# Patient Record
Sex: Female | Born: 1935 | Race: White | Hispanic: No | Marital: Single | Smoking: Never smoker
Health system: Southern US, Community
[De-identification: ages and names within clinical notes are randomized; demographics above are authoritative.]

## PROBLEM LIST (undated history)

## (undated) DIAGNOSIS — Z972 Presence of dental prosthetic device (complete) (partial): Secondary | ICD-10-CM

## (undated) DIAGNOSIS — E7800 Pure hypercholesterolemia, unspecified: Secondary | ICD-10-CM

## (undated) DIAGNOSIS — E079 Disorder of thyroid, unspecified: Secondary | ICD-10-CM

## (undated) DIAGNOSIS — I10 Essential (primary) hypertension: Secondary | ICD-10-CM

## (undated) DIAGNOSIS — I4891 Unspecified atrial fibrillation: Secondary | ICD-10-CM

## (undated) DIAGNOSIS — E039 Hypothyroidism, unspecified: Secondary | ICD-10-CM

## (undated) DIAGNOSIS — E119 Type 2 diabetes mellitus without complications: Secondary | ICD-10-CM

## (undated) DIAGNOSIS — M1990 Unspecified osteoarthritis, unspecified site: Secondary | ICD-10-CM

## (undated) HISTORY — PX: ABDOMINAL HYSTERECTOMY: SHX81

## (undated) HISTORY — PX: SPLENECTOMY: SUR1306

## (undated) HISTORY — PX: TOTAL KNEE ARTHROPLASTY: SHX125

## (undated) HISTORY — PX: TONSILLECTOMY: SUR1361

## (undated) HISTORY — PX: APPENDECTOMY: SHX54

## (undated) HISTORY — PX: CHOLECYSTECTOMY: SHX55

---

## 2005-01-24 ENCOUNTER — Inpatient Hospital Stay: Payer: Self-pay | Admitting: Internal Medicine

## 2005-01-24 ENCOUNTER — Other Ambulatory Visit: Payer: Self-pay

## 2010-07-23 ENCOUNTER — Ambulatory Visit: Payer: Self-pay | Admitting: Internal Medicine

## 2012-04-23 ENCOUNTER — Ambulatory Visit: Payer: Self-pay | Admitting: Family Medicine

## 2013-08-24 DIAGNOSIS — E538 Deficiency of other specified B group vitamins: Secondary | ICD-10-CM | POA: Insufficient documentation

## 2014-10-18 DIAGNOSIS — E7800 Pure hypercholesterolemia, unspecified: Secondary | ICD-10-CM | POA: Insufficient documentation

## 2014-12-04 ENCOUNTER — Emergency Department: Payer: Medicare Other

## 2014-12-04 ENCOUNTER — Encounter: Payer: Self-pay | Admitting: Emergency Medicine

## 2014-12-04 ENCOUNTER — Emergency Department
Admission: EM | Admit: 2014-12-04 | Discharge: 2014-12-04 | Disposition: A | Discharge status: Home / Self Care | Payer: Medicare Other | Attending: Emergency Medicine | Admitting: Emergency Medicine

## 2014-12-04 DIAGNOSIS — Y9389 Activity, other specified: Secondary | ICD-10-CM | POA: Diagnosis not present

## 2014-12-04 DIAGNOSIS — S52571A Other intraarticular fracture of lower end of right radius, initial encounter for closed fracture: Secondary | ICD-10-CM | POA: Insufficient documentation

## 2014-12-04 DIAGNOSIS — Y998 Other external cause status: Secondary | ICD-10-CM | POA: Diagnosis not present

## 2014-12-04 DIAGNOSIS — Y9289 Other specified places as the place of occurrence of the external cause: Secondary | ICD-10-CM | POA: Diagnosis not present

## 2014-12-04 DIAGNOSIS — S52611A Displaced fracture of right ulna styloid process, initial encounter for closed fracture: Secondary | ICD-10-CM | POA: Diagnosis not present

## 2014-12-04 DIAGNOSIS — W1849XA Other slipping, tripping and stumbling without falling, initial encounter: Secondary | ICD-10-CM | POA: Insufficient documentation

## 2014-12-04 DIAGNOSIS — S52501A Unspecified fracture of the lower end of right radius, initial encounter for closed fracture: Secondary | ICD-10-CM

## 2014-12-04 DIAGNOSIS — E119 Type 2 diabetes mellitus without complications: Secondary | ICD-10-CM | POA: Diagnosis not present

## 2014-12-04 DIAGNOSIS — S6991XA Unspecified injury of right wrist, hand and finger(s), initial encounter: Secondary | ICD-10-CM | POA: Diagnosis present

## 2014-12-04 HISTORY — DX: Unspecified atrial fibrillation: I48.91

## 2014-12-04 HISTORY — DX: Pure hypercholesterolemia, unspecified: E78.00

## 2014-12-04 HISTORY — DX: Type 2 diabetes mellitus without complications: E11.9

## 2014-12-04 HISTORY — DX: Disorder of thyroid, unspecified: E07.9

## 2014-12-04 MED ORDER — OXYCODONE-ACETAMINOPHEN 5-325 MG PO TABS: ORAL_TABLET | ORAL | Status: DC

## 2014-12-04 MED ORDER — OXYCODONE-ACETAMINOPHEN 5-325 MG PO TABS
1.0000 | ORAL_TABLET | Freq: Once | ORAL | Status: AC
  Administered 2014-12-04: 1 via ORAL

## 2014-12-04 MED ORDER — OXYCODONE-ACETAMINOPHEN 5-325 MG PO TABS
ORAL_TABLET | ORAL | Status: AC
  Filled 2014-12-04: qty 1

## 2014-12-04 NOTE — ED Notes (Addendum)
Pt to ED via EMS from home. States she was trying to put a trash bag in the bin and tripped over her shoes, putting R arm out to break her fall. Contusion to lateral side of R wrist, pain to site 6/10, movement limited by swelling and pain. Able to move fingers. Denies loss of sensation. Cap refill <3 seconds. Pt takes coumadin.

## 2014-12-04 NOTE — ED Provider Notes (Signed)
[facility] Emergency Department Provider Note  ____________________________________________  Time seen: Approximately 6:07 PM  I have reviewed the triage vital signs and the nursing notes.   HISTORY  Chief Complaint Wrist Pain   HPI Yolanda Cantu is a 81 y.o. female is here via EMS with complaint of right wrist pain. Patient states that she was moving a neighbors trash bag to a BN when she tripped over her flip flops. She remembers putting her right arm out to break her fall and heard a "pop". Patient denies any head injury or loss of consciousness. She states the only thing that hurts is her wrist. She denies any previous wrist fracture. She has not taken any medication prior to her arrival. She states that in the past she has taken Percocet without any difficulty. Currently her pain is moderate however she does not want to take any pain medication at this time.   Past Medical History  Diagnosis Date  . Hypercholesteremia   . Atrial fibrillation (HCC)   . Thyroid disease   . Diabetes mellitus without complication (HCC)     There are no active problems to display for this patient.   Past Surgical History  Procedure Laterality Date  . Appendectomy    . Cholecystectomy    . Splenectomy      Current Outpatient Rx  Name  Route  Sig  Dispense  Refill  . oxyCODONE-acetaminophen (PERCOCET) 5-325 MG tablet      1 tablet q 4-6 hours prn pain   30 tablet   0     Allergies Review of patient's allergies indicates no known allergies.  No family history on file.  Social History Social History  Substance Use Topics  . Smoking status: Never Smoker   . Smokeless tobacco: Not on file  . Alcohol Use: No    Review of Systems Constitutional: No fever/chills Eyes: No visual changes. ENT: No facial trauma Cardiovascular: Denies chest pain. History of atrial fib under cardiologist care Respiratory: Denies shortness of breath. Gastrointestinal: No  abdominal pain.  No nausea, no vomiting.  Musculoskeletal: Negative for back pain. Positive for right wrist pain Skin: Negative for rash. Neurological: Negative for headaches, focal weakness or numbness.  10-point ROS otherwise negative.  ____________________________________________   PHYSICAL EXAM:  VITAL SIGNS: ED Triage Vitals  Enc Vitals Group     BP 12/04/14 1754 156/101 mmHg     Pulse Rate 12/04/14 1754 110     Resp 12/04/14 1754 20     Temp 12/04/14 1754 98.2 F (36.8 C)     Temp Source 12/04/14 1754 Oral     SpO2 12/04/14 1754 97 %     Weight 12/04/14 1754 170 lb (77.111 kg)     Height 12/04/14 1754 5\' 6"  (1.676 m)     Head Cir --      Peak Flow --      Pain Score --      Pain Loc --      Pain Edu? --      Excl. in GC? --     Constitutional: Alert and oriented. Well appearing and in no acute distress. Eyes: Conjunctivae are normal. PERRL. EOMI. Head: Atraumatic. Nose: No congestion/rhinnorhea. Neck: No stridor.  No cervical tenderness on palpation. Range of motion of the neck is within normal limits. Cardiovascular: Slightly tachycardic at arrival to the emergency room with irregular rate. Grossly normal heart sounds.  Good peripheral circulation. Respiratory: Normal respiratory effort.  No retractions. Lungs  CTAB. Gastrointestinal: Soft and nontender. No distention.  Musculoskeletal: Moderate tenderness on palpation of the right wrist. Range of motion of the digits is within normal limits. Capillary refill is less than 3 seconds. Motor sensory function intact. No lower extremity tenderness nor edema.   Neurologic:  Normal speech and language. No gross focal neurologic deficits are appreciated. No gait instability. Skin:  Skin is warm, dry and intact. No rash noted. Psychiatric: Mood and affect are normal. Speech and behavior are normal.  ____________________________________________   LABS (all labs ordered are listed, but only abnormal results are  displayed)  Labs Reviewed - No data to display   RADIOLOGY  X-ray the right wrist shows a nondisplaced comminuted distal radius fracture with mild apex volar angulation. Right ulnar styloid fracture.  I, Tommi Rumps, personally viewed and evaluated these images (plain radiographs) as part of my medical decision making.  ____________________________________________   PROCEDURES  Procedure(s) performed: None  Critical Care performed: No  ____________________________________________   INITIAL IMPRESSION / ASSESSMENT AND PLAN / ED COURSE  Pertinent labs & imaging results that were available during my care of the patient were reviewed by me and considered in my medical decision making (see chart for details).  Patient was placed in an OCL splint and sling. She was instructed to call [facility] orthopedic department to make an appointment. She is given a prescription for Percocet as needed for pain. She was aware that this could cause drowsiness which may increase her risk for falling. ____________________________________________   FINAL CLINICAL IMPRESSION(S) / ED DIAGNOSES  Final diagnoses:  Fracture of right distal radius, closed, initial encounter  Fracture of ulnar styloid, right, closed, initial encounter      Tommi Rumps, PA-C 12/04/14 1956  Jene Every, MD 12/04/14 2342

## 2014-12-04 NOTE — ED Notes (Signed)
Discussed discharge instructions, prescriptions, and follow-up care with patient. No questions or concerns at this time. Pt stable at discharge.  

## 2014-12-04 NOTE — Discharge Instructions (Signed)
Weight splint on until seen by orthopedist. Ice and elevate to reduce swelling. Percocet as needed for pain only as prescribed, one every 4-6 hours as needed for pain. The aware that this could cause drowsiness which may increase her risk of falling. Call Monday for an appointment to be seen for fracture. Return to the emergency room if any severe worsening of your symptoms.

## 2015-05-04 ENCOUNTER — Encounter: Payer: Self-pay | Admitting: *Deleted

## 2015-05-05 NOTE — Discharge Instructions (Signed)

## 2015-05-09 ENCOUNTER — Encounter
Admission: RE | Disposition: A | Discharge status: Home / Self Care | Payer: Self-pay | Source: Ambulatory Visit | Attending: Ophthalmology

## 2015-05-09 ENCOUNTER — Ambulatory Visit: Payer: Medicare Other | Admitting: Anesthesiology

## 2015-05-09 ENCOUNTER — Ambulatory Visit
Admission: RE | Admit: 2015-05-09 | Discharge: 2015-05-09 | Disposition: A | Discharge status: Home / Self Care | Payer: Medicare Other | Source: Ambulatory Visit | Attending: Ophthalmology | Admitting: Ophthalmology

## 2015-05-09 DIAGNOSIS — H2512 Age-related nuclear cataract, left eye: Secondary | ICD-10-CM | POA: Insufficient documentation

## 2015-05-09 DIAGNOSIS — E119 Type 2 diabetes mellitus without complications: Secondary | ICD-10-CM | POA: Diagnosis not present

## 2015-05-09 DIAGNOSIS — Z9889 Other specified postprocedural states: Secondary | ICD-10-CM | POA: Insufficient documentation

## 2015-05-09 DIAGNOSIS — Z79899 Other long term (current) drug therapy: Secondary | ICD-10-CM | POA: Insufficient documentation

## 2015-05-09 DIAGNOSIS — Z96652 Presence of left artificial knee joint: Secondary | ICD-10-CM | POA: Diagnosis not present

## 2015-05-09 DIAGNOSIS — Z90710 Acquired absence of both cervix and uterus: Secondary | ICD-10-CM | POA: Insufficient documentation

## 2015-05-09 DIAGNOSIS — H269 Unspecified cataract: Secondary | ICD-10-CM | POA: Diagnosis present

## 2015-05-09 DIAGNOSIS — E7800 Pure hypercholesterolemia, unspecified: Secondary | ICD-10-CM | POA: Insufficient documentation

## 2015-05-09 DIAGNOSIS — I10 Essential (primary) hypertension: Secondary | ICD-10-CM | POA: Diagnosis not present

## 2015-05-09 DIAGNOSIS — Z9049 Acquired absence of other specified parts of digestive tract: Secondary | ICD-10-CM | POA: Diagnosis not present

## 2015-05-09 DIAGNOSIS — E039 Hypothyroidism, unspecified: Secondary | ICD-10-CM | POA: Diagnosis not present

## 2015-05-09 DIAGNOSIS — M1990 Unspecified osteoarthritis, unspecified site: Secondary | ICD-10-CM | POA: Insufficient documentation

## 2015-05-09 DIAGNOSIS — Z7901 Long term (current) use of anticoagulants: Secondary | ICD-10-CM | POA: Diagnosis not present

## 2015-05-09 HISTORY — PX: CATARACT EXTRACTION W/PHACO: SHX586

## 2015-05-09 HISTORY — DX: Hypothyroidism, unspecified: E03.9

## 2015-05-09 HISTORY — DX: Presence of dental prosthetic device (complete) (partial): Z97.2

## 2015-05-09 HISTORY — DX: Unspecified osteoarthritis, unspecified site: M19.90

## 2015-05-09 HISTORY — DX: Essential (primary) hypertension: I10

## 2015-05-09 LAB — GLUCOSE, CAPILLARY
GLUCOSE-CAPILLARY: 86 mg/dL (ref 65–99)
Glucose-Capillary: 89 mg/dL (ref 65–99)

## 2015-05-09 SURGERY — PHACOEMULSIFICATION, CATARACT, WITH IOL INSERTION
Anesthesia: Monitor Anesthesia Care | Site: Eye | Laterality: Left | Wound class: Clean

## 2015-05-09 MED ORDER — BALANCED SALT IO SOLN
INTRAOCULAR | Status: DC | PRN
  Administered 2015-05-09: 1 mL via OPHTHALMIC

## 2015-05-09 MED ORDER — ACETAMINOPHEN 325 MG PO TABS: 325.0000 mg | ORAL_TABLET | ORAL | Status: DC | PRN

## 2015-05-09 MED ORDER — ARMC OPHTHALMIC DILATING GEL
1.0000 "application " | OPHTHALMIC | Status: DC | PRN
  Administered 2015-05-09 (×2): 1 via OPHTHALMIC

## 2015-05-09 MED ORDER — TETRACAINE HCL 0.5 % OP SOLN
1.0000 [drp] | OPHTHALMIC | Status: DC | PRN
  Administered 2015-05-09: 1 [drp] via OPHTHALMIC

## 2015-05-09 MED ORDER — BRIMONIDINE TARTRATE 0.2 % OP SOLN
OPHTHALMIC | Status: DC | PRN
  Administered 2015-05-09: 1 [drp] via OPHTHALMIC

## 2015-05-09 MED ORDER — LACTATED RINGERS IV SOLN: INTRAVENOUS | Status: DC

## 2015-05-09 MED ORDER — MIDAZOLAM HCL 2 MG/2ML IJ SOLN
INTRAMUSCULAR | Status: DC | PRN
  Administered 2015-05-09 (×2): 1 mg via INTRAVENOUS

## 2015-05-09 MED ORDER — FENTANYL CITRATE (PF) 100 MCG/2ML IJ SOLN
INTRAMUSCULAR | Status: DC | PRN
  Administered 2015-05-09 (×2): 50 ug via INTRAVENOUS

## 2015-05-09 MED ORDER — POVIDONE-IODINE 5 % OP SOLN
1.0000 "application " | OPHTHALMIC | Status: DC | PRN
  Administered 2015-05-09: 1 via OPHTHALMIC

## 2015-05-09 MED ORDER — TIMOLOL MALEATE 0.5 % OP SOLN: OPHTHALMIC | Status: DC | PRN

## 2015-05-09 MED ORDER — BSS IO SOLN
INTRAOCULAR | Status: DC | PRN
  Administered 2015-05-09: 197 mL via OPHTHALMIC

## 2015-05-09 MED ORDER — ACETAMINOPHEN 160 MG/5ML PO SOLN: 325.0000 mg | ORAL | Status: DC | PRN

## 2015-05-09 SURGICAL SUPPLY — 28 items

## 2015-05-09 NOTE — Op Note (Signed)
Date of Surgery: 05/09/2015  PREOPERATIVE DIAGNOSES: Visually significant nuclear sclerotic cataract, left eye.  POSTOPERATIVE DIAGNOSES: Same  PROCEDURES PERFORMED: Cataract extraction with intraocular lens implant, left eye.  SURGEON: Devin GoingAnita P. Vin, M.D.  ANESTHESIA: MAC and topical  IMPLANTS: AUT00 +22.5  Implant Name Type Inv. Item Serial No. Manufacturer Lot No. LRB No. Used  [id number] IOL PRE-LOADED     [id number] 153 [employer]   Left 1    COMPLICATIONS: None.  DESCRIPTION OF PROCEDURE: Therapeutic options were discussed with the patient preoperatively, including a discussion of risks and benefits of surgery. Informed consent was obtained. An IOL-Master and immersion biometry were used to take the lens measurements, and a dilated fundus exam was performed within 6 months of the surgical date.  The patient was premedicated and brought to the operating room and placed on the operating table in the supine position. After adequate anesthesia, the patient was prepped and draped in the usual sterile ophthalmic fashion. A wire lid speculum was inserted and the microscope was positioned. A Superblade was used to create a paracentesis site at the limbus and a small amount of dilute preservative free lidocaine was instilled into the anterior chamber, followed by dispersive viscoelastic. A clear corneal incision was created temporally using a 2.4 mm keratome blade. Capsulorrhexis was then performed. In situ phacoemulsification was performed.  Cortical material was removed with the irrigation-aspiration unit. Dispersive viscoelastic was instilled to open the capsular bag. A posterior chamber intraocular lens with the specifications above was inserted and positioned. Irrigation-aspiration was used to remove all viscoelastic. Cefuroxime 1cc was instilled into the anterior chamber, and the corneal incision was checked and found to be water tight. The eyelid speculum was removed.  The operative eye was  covered with protective goggles after instilling 1 drop of timolol and brimonidine. The patient tolerated the procedure well. There were no complications.

## 2015-05-09 NOTE — H&P (Signed)
H+P reviewed and is up to date, please see paper chart.  

## 2015-05-09 NOTE — Anesthesia Postprocedure Evaluation (Signed)
Anesthesia Post Note  Patient: Yolanda Cantu  Procedure(s) Performed: Procedure(s) (LRB): CATARACT EXTRACTION PHACO AND INTRAOCULAR LENS PLACEMENT (IOC) left eye (Left)  Patient location during evaluation: PACU Anesthesia Type: MAC Level of consciousness: awake and alert Pain management: pain level controlled Vital Signs Assessment: post-procedure vital signs reviewed and stable Respiratory status: spontaneous breathing and respiratory function stable Cardiovascular status: stable Anesthetic complications: no    Sharita Bienaime, III,  Freddy Spadafora D

## 2015-05-09 NOTE — Anesthesia Preprocedure Evaluation (Signed)
Anesthesia Evaluation  Patient identified by MRN, date of birth, ID band Patient awake    Reviewed: Allergy & Precautions, H&P , NPO status , Patient's Chart, lab work & pertinent test results  Airway Mallampati: II  TM Distance: >3 FB     Dental no notable dental hx.    Pulmonary neg pulmonary ROS,    Pulmonary exam normal        Cardiovascular hypertension, On Medications Normal cardiovascular exam     Neuro/Psych negative neurological ROS  negative psych ROS   GI/Hepatic negative GI ROS, Neg liver ROS,   Endo/Other  diabetes, Type 2Hypothyroidism   Renal/GU      Musculoskeletal   Abdominal   Peds  Hematology negative hematology ROS (+)   Anesthesia Other Findings   Reproductive/Obstetrics                             Anesthesia Physical Anesthesia Plan  ASA: II  Anesthesia Plan: MAC   Post-op Pain Management:    Induction:   Airway Management Planned:   Additional Equipment:   Intra-op Plan:   Post-operative Plan:   Informed Consent: I have reviewed the patients History and Physical, chart, labs and discussed the procedure including the risks, benefits and alternatives for the proposed anesthesia with the patient or authorized representative who has indicated his/her understanding and acceptance.     Plan Discussed with: CRNA  Anesthesia Plan Comments:         Anesthesia Quick Evaluation

## 2015-05-09 NOTE — Anesthesia Procedure Notes (Signed)
Procedure Name: MAC Performed by: Zorawar Strollo Pre-anesthesia Checklist: Patient identified, Emergency Drugs available, Suction available, Timeout performed and Patient being monitored Patient Re-evaluated:Patient Re-evaluated prior to inductionOxygen Delivery Method: Nasal cannula Placement Confirmation: positive ETCO2     

## 2015-05-09 NOTE — Transfer of Care (Signed)
Immediate Anesthesia Transfer of Care Note  Patient: Yolanda SportsMary R Gelder  Procedure(s) Performed: Procedure(s) with comments: CATARACT EXTRACTION PHACO AND INTRAOCULAR LENS PLACEMENT (IOC) left eye (Left) - DIABETIC - oral meds per pt 8 or after for arrival time  Patient Location: PACU  Anesthesia Type: MAC  Level of Consciousness: awake, alert  and patient cooperative  Airway and Oxygen Therapy: Patient Spontanous Breathing and Patient connected to supplemental oxygen  Post-op Assessment: Post-op Vital signs reviewed, Patient's Cardiovascular Status Stable, Respiratory Function Stable, Patent Airway and No signs of Nausea or vomiting  Post-op Vital Signs: Reviewed and stable  Complications: No apparent anesthesia complications

## 2015-05-10 ENCOUNTER — Encounter: Payer: Self-pay | Admitting: Ophthalmology

## 2015-06-25 ENCOUNTER — Encounter: Payer: Self-pay | Admitting: Emergency Medicine

## 2015-06-25 ENCOUNTER — Ambulatory Visit
Admission: EM | Admit: 2015-06-25 | Discharge: 2015-06-25 | Disposition: A | Discharge status: Home / Self Care | Payer: Medicare Other | Attending: Family Medicine | Admitting: Family Medicine

## 2015-06-25 DIAGNOSIS — R3911 Hesitancy of micturition: Secondary | ICD-10-CM

## 2015-06-25 DIAGNOSIS — R350 Frequency of micturition: Secondary | ICD-10-CM

## 2015-06-25 MED ORDER — AMOXICILLIN 500 MG PO CAPS: 500.0000 mg | ORAL_CAPSULE | Freq: Two times a day (BID) | ORAL | Status: DC

## 2015-06-25 NOTE — ED Notes (Signed)
Patient c/o bladder pressure, urinary frequency that started today.

## 2015-06-25 NOTE — Discharge Instructions (Signed)
Urinary Frequency °The number of times a normal person urinates depends upon how much liquid they take in and how much liquid they are losing. If the temperature is hot and there is high humidity, then the person will sweat more and usually breathe a little more frequently. These factors decrease the amount of frequency of urination that would be considered normal. °The amount you drink is easily determined, but the amount of fluid lost is sometimes more difficult to calculate.  °Fluid is lost in two ways: °· Sensible fluid loss is usually measured by the amount of urine that you get rid of. Losses of fluid can also occur with diarrhea. °· Insensible fluid loss is more difficult to measure. It is caused by evaporation. Insensible loss of fluid occurs through breathing and sweating. It usually ranges from a little less than a quart to a little more than a quart of fluid a day. °In normal temperatures and activity levels, the average person may urinate 4 to 7 times in a 24-hour period. Needing to urinate more often than that could indicate a problem. If one urinates 4 to 7 times in 24 hours and has large volumes each time, that could indicate a different problem from one who urinates 4 to 7 times a day and has small volumes. The time of urinating is also important. Most urinating should be done during the waking hours. Getting up at night to urinate frequently can indicate some problems. °CAUSES  °The bladder is the organ in your lower abdomen that holds urine. Like a balloon, it swells some as it fills up. Your nerves sense this and tell you it is time to head for the bathroom. There are a number of reasons that you might feel the need to urinate more often than usual. They include: °· Urinary tract infection. This is usually associated with other signs such as burning when you urinate. °· In men, problems with the prostate (a walnut-size gland that is located near the tube that carries urine out of your body). There  are two reasons why the prostate can cause an increased frequency of urination: °¨ An enlarged prostate that does not let the bladder empty well. If the bladder only half empties when you urinate, then it only has half the capacity to fill before you have to urinate again. °¨ The nerves in the bladder become more hypersensitive with an increased size of the prostate even if the bladder empties completely. °· Pregnancy. °· Obesity. Excess weight is more likely to cause a problem for women than for men. °· Bladder stones or other bladder problems. °· Caffeine. °· Alcohol. °· Medications. For example, drugs that help the body get rid of extra fluid (diuretics) increase urine production. Some other medicines must be taken with lots of fluids. °· Muscle or nerve weakness. This might be the result of a spinal cord injury, a stroke, multiple sclerosis, or Parkinson disease. °· Long-standing diabetes can decrease the sensation of the bladder. This loss of sensation makes it harder to sense the bladder needs to be emptied. Over a period of years, the bladder is stretched out by constant overfilling. This weakens the bladder muscles so that the bladder does not empty well and has less capacity to fill with new urine. °· Interstitial cystitis (also called painful bladder syndrome). This condition develops because the tissues that line the inside of the bladder are inflamed (inflammation is the body's way of reacting to injury or infection). It causes pain and frequent   urination. It occurs in women more often than in men. °DIAGNOSIS  °· To decide what might be causing your urinary frequency, your health care provider will probably: °¨ Ask about symptoms you have noticed. °¨ Ask about your overall health. This will include questions about any medications you are taking. °¨ Do a physical examination. °· Order some tests. These might include: °¨ A blood test to check for diabetes or other health issues that could be contributing  to the problem. °¨ Urine testing. This could measure the flow of urine and the pressure on the bladder. °¨ A test of your neurological system (the brain, spinal cord, and nerves). This is the system that senses the need to urinate. °¨ A bladder test to check whether it is emptying completely when you urinate. °¨ Cystoscopy. This test uses a thin tube with a tiny camera on it. It offers a look inside your urethra and bladder to see if there are problems. °¨ Imaging tests. You might be given a contrast dye and then asked to urinate. X-rays are taken to see how your bladder is working. °TREATMENT  °It is important for you to be evaluated to determine if the amount or frequency that you have is unusual or abnormal. If it is found to be abnormal, the cause should be determined and this can usually be found out easily. Depending upon the cause, treatment could include medication, stimulation of the nerves, or surgery. °There are not too many things that you can do as an individual to change your urinary frequency. It is important that you balance the amount of fluid intake needed to compensate for your activity and the temperature. Medical problems will be diagnosed and taken care of by your physician. There is no particular bladder training such as Kegel exercises that you can do to help urinary frequency. This is an exercise that is usually recommended for people who have leaking of urine when they laugh, cough, or sneeze. °HOME CARE INSTRUCTIONS  °· Take any medications your health care provider prescribed or suggested. Follow the directions carefully. °· Practice any lifestyle changes that are recommended. These might include: °¨ Drinking less fluid or drinking at different times of the day. If you need to urinate often during the night, for example, you may need to stop drinking fluids early in the evening. °¨ Cutting down on caffeine or alcohol. They both can make you need to urinate more often than normal. Caffeine  is found in coffee, tea, and sodas. °¨ Losing weight, if that is recommended. °· Keep a journal or a log. You might be asked to record how much you drink and when and where you feel the need to urinate. This will also help evaluate how well the treatment provided by your physician is working. °SEEK MEDICAL CARE IF:  °· Your need to urinate often gets worse. °· You feel increased pain or irritation when you urinate. °· You notice blood in your urine. °· You have questions about any medications that your health care provider recommended. °· You notice blood, pus, or swelling at the site of any test or treatment procedure. °· You develop a fever of more than 100.5°F (38.1°C). °SEEK IMMEDIATE MEDICAL CARE IF:  °You develop a fever of more than 102.0°F (38.9°C). °  °This information is not intended to replace advice given to you by your health care provider. Make sure you discuss any questions you have with your health care provider. °  °Document Released: 11/11/2008 Document Revised:   02/05/2014 Document Reviewed: 11/11/2008 °Elsevier Interactive Patient Education ©2016 [employer] ° °

## 2015-06-25 NOTE — ED Provider Notes (Signed)
CSN: [id number]     Arrival date & time 06/25/15  1529 History   First MD Initiated Contact with Patient 06/25/15 1620     Chief Complaint  Patient presents with  . Dysuria   (Consider location/radiation/quality/duration/timing/severity/associated sxs/prior Treatment) Patient is a 82 y.o. female presenting with frequency. The history is provided by the patient. No language interpreter was used.  Urinary Frequency This is a new problem. The current episode started 6 to 12 hours ago. The problem occurs constantly. The problem has not changed since onset.Pertinent negatives include no chest pain, no abdominal pain, no headaches and no shortness of breath. Associated symptoms comments: Urinary hesitancy; pressure over the bladder; denies fevers, chills, abdominal pain, flank pain, vomiting. She has tried nothing for the symptoms.    Past Medical History  Diagnosis Date  . Hypercholesteremia   . Atrial fibrillation (HCC)   . Thyroid disease   . Diabetes mellitus without complication (HCC)   . Arthritis     knees  . Wears dentures     partial lower  . Hypertension   . Hypothyroidism    Past Surgical History  Procedure Laterality Date  . Appendectomy    . Cholecystectomy    . Splenectomy    . Abdominal hysterectomy    . Splenectomy    . Tonsillectomy    . Total knee arthroplasty Left   . Cataract extraction w/phaco Left 05/09/2015    Procedure: CATARACT EXTRACTION PHACO AND INTRAOCULAR LENS PLACEMENT (IOC) left eye;  Surgeon: Sherald Hess, MD;  Location[facility];  Service: Ophthalmology;  Laterality: Left;  DIABETIC - oral meds per pt 8 or after for arrival time   History reviewed. No pertinent family history. Social History  Substance Use Topics  . Smoking status: Never Smoker   . Smokeless tobacco: None  . Alcohol Use: No   OB History    No data available     Review of Systems  Respiratory: Negative for shortness of breath.   Cardiovascular:  Negative for chest pain.  Gastrointestinal: Negative for abdominal pain.  Genitourinary: Positive for frequency.  Neurological: Negative for headaches.    Allergies  Review of patient's allergies indicates no known allergies.  Home Medications   Prior to Admission medications   Medication Sig Start Date End Date Taking? Authorizing Provider  acetaminophen (TYLENOL) 500 MG tablet Take 500 mg by mouth every 6 (six) hours as needed.    Historical Provider, MD  amoxicillin (AMOXIL) 500 MG capsule Take 1 capsule (500 mg total) by mouth 2 (two) times daily. 06/25/15   Payton Mccallum, MD  atorvastatin (LIPITOR) 40 MG tablet Take 20 mg by mouth daily.    Historical Provider, MD  Cyanocobalamin (VITAMIN B-12 IJ) Inject as directed every 30 (thirty) days.    Historical Provider, MD  diltiazem (TIAZAC) 240 MG 24 hr capsule Take 240 mg by mouth daily.    Historical Provider, MD  diphenhydramine-acetaminophen (TYLENOL PM) 25-500 MG TABS tablet Take 2 tablets by mouth at bedtime as needed.    Historical Provider, MD  latanoprost (XALATAN) 0.005 % ophthalmic solution Place 1 drop into both eyes at bedtime.    Historical Provider, MD  levothyroxine (SYNTHROID, LEVOTHROID) 125 MCG tablet Take 125 mcg by mouth daily before breakfast.    Historical Provider, MD  losartan (COZAAR) 50 MG tablet Take 100 mg by mouth daily.     Historical Provider, MD  metFORMIN (GLUCOPHAGE) 500 MG tablet Take 250 mg by mouth 2 (two) times  daily with a meal.    Historical Provider, MD  timolol (BETIMOL) 0.5 % ophthalmic solution Place 1 drop into both eyes daily.    Historical Provider, MD  warfarin (COUMADIN) 2 MG tablet Take 2 mg by mouth daily.    Historical Provider, MD   Meds Ordered and Administered this Visit  Medications - No data to display  BP 140/80 mmHg  Pulse 103  Temp(Src) 97.3 F (36.3 C) (Tympanic)  Resp 17  Ht 5\' 6"  (1.676 m)  Wt 170 lb (77.111 kg)  BMI 27.45 kg/m2  SpO2 97% No data found.   Physical  Exam  Constitutional: She appears well-developed and well-nourished. No distress.  Abdominal: Soft. Bowel sounds are normal. She exhibits no distension and no mass. There is no tenderness. There is no rebound and no guarding.  Skin: She is not diaphoretic.  Nursing note and vitals reviewed.   ED Course  Procedures (including critical care time)  Labs Review Labs Reviewed - No data to display  Imaging Review No results found.   Visual Acuity Review  Right Eye Distance:   Left Eye Distance:   Bilateral Distance:    Right Eye Near:   Left Eye Near:    Bilateral Near:         MDM   1. Urinary frequency   2. Urinary hesitancy    Discharge Medication List as of 06/25/2015  4:48 PM    START taking these medications   Details  amoxicillin (AMOXIL) 500 MG capsule Take 1 capsule (500 mg total) by mouth 2 (two) times daily., Starting 06/25/2015, Until Discontinued, Normal       1. Possible etiology reviewed with patient; (patient unable to urinate for sample in clinic) 2. rx as per orders above; reviewed possible side effects, interactions, risks and benefits  3. Recommend supportive treatment with increased fluids/water 4. Follow-up prn if symptoms worsen or don't improve    Payton Mccallumrlando Jaqueline Uber, MD 06/25/15 1726

## 2015-09-23 ENCOUNTER — Emergency Department: Payer: Medicare Other

## 2015-09-23 ENCOUNTER — Encounter: Payer: Self-pay | Admitting: Emergency Medicine

## 2015-09-23 ENCOUNTER — Inpatient Hospital Stay
Admission: EM | Admit: 2015-09-23 | Discharge: 2015-09-28 | DRG: 481 | Disposition: A | Discharge status: Skilled Nursing Facility | Payer: Medicare Other | Attending: Internal Medicine | Admitting: Internal Medicine

## 2015-09-23 DIAGNOSIS — M25461 Effusion, right knee: Secondary | ICD-10-CM | POA: Diagnosis present

## 2015-09-23 DIAGNOSIS — Z7901 Long term (current) use of anticoagulants: Secondary | ICD-10-CM | POA: Diagnosis not present

## 2015-09-23 DIAGNOSIS — Z823 Family history of stroke: Secondary | ICD-10-CM | POA: Diagnosis not present

## 2015-09-23 DIAGNOSIS — Z801 Family history of malignant neoplasm of trachea, bronchus and lung: Secondary | ICD-10-CM | POA: Diagnosis not present

## 2015-09-23 DIAGNOSIS — Z833 Family history of diabetes mellitus: Secondary | ICD-10-CM | POA: Diagnosis not present

## 2015-09-23 DIAGNOSIS — Z8249 Family history of ischemic heart disease and other diseases of the circulatory system: Secondary | ICD-10-CM | POA: Diagnosis not present

## 2015-09-23 DIAGNOSIS — W1830XA Fall on same level, unspecified, initial encounter: Secondary | ICD-10-CM | POA: Diagnosis present

## 2015-09-23 DIAGNOSIS — I10 Essential (primary) hypertension: Secondary | ICD-10-CM | POA: Diagnosis present

## 2015-09-23 DIAGNOSIS — E876 Hypokalemia: Secondary | ICD-10-CM | POA: Diagnosis not present

## 2015-09-23 DIAGNOSIS — E871 Hypo-osmolality and hyponatremia: Secondary | ICD-10-CM | POA: Diagnosis present

## 2015-09-23 DIAGNOSIS — M25569 Pain in unspecified knee: Secondary | ICD-10-CM

## 2015-09-23 DIAGNOSIS — Z96652 Presence of left artificial knee joint: Secondary | ICD-10-CM | POA: Diagnosis present

## 2015-09-23 DIAGNOSIS — E039 Hypothyroidism, unspecified: Secondary | ICD-10-CM | POA: Diagnosis present

## 2015-09-23 DIAGNOSIS — D62 Acute posthemorrhagic anemia: Secondary | ICD-10-CM | POA: Diagnosis not present

## 2015-09-23 DIAGNOSIS — I4891 Unspecified atrial fibrillation: Secondary | ICD-10-CM | POA: Diagnosis present

## 2015-09-23 DIAGNOSIS — Y92000 Kitchen of unspecified non-institutional (private) residence as  the place of occurrence of the external cause: Secondary | ICD-10-CM

## 2015-09-23 DIAGNOSIS — E119 Type 2 diabetes mellitus without complications: Secondary | ICD-10-CM | POA: Diagnosis present

## 2015-09-23 DIAGNOSIS — E7800 Pure hypercholesterolemia, unspecified: Secondary | ICD-10-CM | POA: Diagnosis present

## 2015-09-23 DIAGNOSIS — Z7984 Long term (current) use of oral hypoglycemic drugs: Secondary | ICD-10-CM | POA: Diagnosis not present

## 2015-09-23 DIAGNOSIS — Z79899 Other long term (current) drug therapy: Secondary | ICD-10-CM

## 2015-09-23 DIAGNOSIS — D72829 Elevated white blood cell count, unspecified: Secondary | ICD-10-CM | POA: Diagnosis present

## 2015-09-23 DIAGNOSIS — Z9081 Acquired absence of spleen: Secondary | ICD-10-CM | POA: Diagnosis not present

## 2015-09-23 DIAGNOSIS — S72141A Displaced intertrochanteric fracture of right femur, initial encounter for closed fracture: Secondary | ICD-10-CM | POA: Diagnosis present

## 2015-09-23 DIAGNOSIS — S72001A Fracture of unspecified part of neck of right femur, initial encounter for closed fracture: Secondary | ICD-10-CM | POA: Diagnosis present

## 2015-09-23 DIAGNOSIS — S72009A Fracture of unspecified part of neck of unspecified femur, initial encounter for closed fracture: Secondary | ICD-10-CM

## 2015-09-23 LAB — URINALYSIS COMPLETE WITH MICROSCOPIC (ARMC ONLY)
BILIRUBIN URINE: NEGATIVE
Bacteria, UA: NONE SEEN
Glucose, UA: NEGATIVE mg/dL
KETONES UR: NEGATIVE mg/dL
LEUKOCYTES UA: NEGATIVE
NITRITE: NEGATIVE
PH: 6 (ref 5.0–8.0)
PROTEIN: 30 mg/dL — AB
SPECIFIC GRAVITY, URINE: 1.008 (ref 1.005–1.030)
Squamous Epithelial / LPF: NONE SEEN

## 2015-09-23 LAB — CBC
HCT: 33.8 % — ABNORMAL LOW (ref 35.0–47.0)
Hemoglobin: 11.5 g/dL — ABNORMAL LOW (ref 12.0–16.0)
MCH: 30.9 pg (ref 26.0–34.0)
MCHC: 34.1 g/dL (ref 32.0–36.0)
MCV: 90.6 fL (ref 80.0–100.0)
PLATELETS: 294 10*3/uL (ref 150–440)
RBC: 3.73 MIL/uL — AB (ref 3.80–5.20)
RDW: 15.1 % — AB (ref 11.5–14.5)
WBC: 10.7 10*3/uL (ref 3.6–11.0)

## 2015-09-23 LAB — PROTIME-INR
INR: 2.57
Prothrombin Time: 28.1 seconds — ABNORMAL HIGH (ref 11.4–15.2)

## 2015-09-23 LAB — BASIC METABOLIC PANEL
Anion gap: 7 (ref 5–15)
BUN: 12 mg/dL (ref 6–20)
CALCIUM: 8.7 mg/dL — AB (ref 8.9–10.3)
CO2: 23 mmol/L (ref 22–32)
Chloride: 100 mmol/L — ABNORMAL LOW (ref 101–111)
Creatinine, Ser: 0.55 mg/dL (ref 0.44–1.00)
GLUCOSE: 122 mg/dL — AB (ref 65–99)
POTASSIUM: 3.5 mmol/L (ref 3.5–5.1)
Sodium: 130 mmol/L — ABNORMAL LOW (ref 135–145)

## 2015-09-23 MED ORDER — ONDANSETRON HCL 4 MG/2ML IJ SOLN
4.0000 mg | Freq: Once | INTRAMUSCULAR | Status: AC
  Administered 2015-09-23: 4 mg via INTRAVENOUS
  Filled 2015-09-23: qty 2

## 2015-09-23 MED ORDER — CEFAZOLIN SODIUM-DEXTROSE 2-4 GM/100ML-% IV SOLN
2.0000 g | Freq: Once | INTRAVENOUS | Status: AC
  Administered 2015-09-25: 2 g via INTRAVENOUS
  Filled 2015-09-23 (×2): qty 100

## 2015-09-23 MED ORDER — MORPHINE SULFATE (PF) 4 MG/ML IV SOLN
4.0000 mg | Freq: Once | INTRAVENOUS | Status: AC
  Administered 2015-09-23: 4 mg via INTRAVENOUS

## 2015-09-23 MED ORDER — SODIUM CHLORIDE 0.9 % IV SOLN
INTRAVENOUS | Status: DC
  Administered 2015-09-23: via INTRAVENOUS

## 2015-09-23 MED ORDER — METHOCARBAMOL 500 MG PO TABS
500.0000 mg | ORAL_TABLET | Freq: Four times a day (QID) | ORAL | Status: DC | PRN
  Administered 2015-09-24 (×2): 500 mg via ORAL
  Filled 2015-09-23 (×2): qty 1

## 2015-09-23 MED ORDER — LOSARTAN POTASSIUM 50 MG PO TABS
100.0000 mg | ORAL_TABLET | Freq: Every day | ORAL | Status: DC
  Filled 2015-09-23 (×2): qty 2

## 2015-09-23 MED ORDER — DEXTROSE 5 % IV SOLN: 500.0000 mg | Freq: Four times a day (QID) | INTRAVENOUS | Status: DC | PRN

## 2015-09-23 MED ORDER — VITAMIN K1 10 MG/ML IJ SOLN
10.0000 mg | Freq: Once | INTRAVENOUS | Status: AC
  Administered 2015-09-24: 10 mg via INTRAVENOUS
  Filled 2015-09-23: qty 1

## 2015-09-23 MED ORDER — ONDANSETRON HCL 4 MG PO TABS: 4.0000 mg | ORAL_TABLET | Freq: Four times a day (QID) | ORAL | Status: DC | PRN

## 2015-09-23 MED ORDER — SODIUM CHLORIDE 0.9 % IV BOLUS (SEPSIS)
1000.0000 mL | Freq: Once | INTRAVENOUS | Status: AC
  Administered 2015-09-23: 1000 mL via INTRAVENOUS

## 2015-09-23 MED ORDER — MORPHINE SULFATE (PF) 4 MG/ML IV SOLN: 4.0000 mg | INTRAVENOUS | Status: DC | PRN

## 2015-09-23 MED ORDER — HYDROMORPHONE HCL 1 MG/ML IJ SOLN
0.5000 mg | INTRAMUSCULAR | Status: DC | PRN
  Administered 2015-09-23 – 2015-09-25 (×8): 0.5 mg via INTRAVENOUS
  Filled 2015-09-23 (×9): qty 1

## 2015-09-23 MED ORDER — ACETAMINOPHEN 325 MG PO TABS: 650.0000 mg | ORAL_TABLET | Freq: Four times a day (QID) | ORAL | Status: DC | PRN

## 2015-09-23 MED ORDER — ONDANSETRON HCL 4 MG/2ML IJ SOLN: 4.0000 mg | Freq: Four times a day (QID) | INTRAMUSCULAR | Status: DC | PRN

## 2015-09-23 MED ORDER — FENTANYL CITRATE (PF) 100 MCG/2ML IJ SOLN
50.0000 ug | Freq: Once | INTRAMUSCULAR | Status: AC
  Administered 2015-09-23: 50 ug via INTRAVENOUS
  Filled 2015-09-23: qty 2

## 2015-09-23 MED ORDER — ACETAMINOPHEN 650 MG RE SUPP: 650.0000 mg | Freq: Four times a day (QID) | RECTAL | Status: DC | PRN

## 2015-09-23 MED ORDER — OXYCODONE HCL 5 MG PO TABS: 5.0000 mg | ORAL_TABLET | ORAL | Status: DC | PRN

## 2015-09-23 MED ORDER — MORPHINE SULFATE (PF) 2 MG/ML IV SOLN: 2.0000 mg | INTRAVENOUS | Status: DC | PRN

## 2015-09-23 MED ORDER — INSULIN ASPART 100 UNIT/ML ~~LOC~~ SOLN
0.0000 [IU] | Freq: Four times a day (QID) | SUBCUTANEOUS | Status: DC
  Administered 2015-09-24 – 2015-09-26 (×2): 1 [IU] via SUBCUTANEOUS
  Filled 2015-09-23: qty 1

## 2015-09-23 MED ORDER — OXYCODONE HCL 5 MG PO TABS
5.0000 mg | ORAL_TABLET | ORAL | Status: DC | PRN
  Administered 2015-09-24 (×3): 10 mg via ORAL
  Filled 2015-09-23 (×3): qty 2

## 2015-09-23 MED ORDER — SODIUM CHLORIDE 0.9% FLUSH
3.0000 mL | Freq: Two times a day (BID) | INTRAVENOUS | Status: DC
  Administered 2015-09-24 – 2015-09-27 (×2): 3 mL via INTRAVENOUS

## 2015-09-23 MED ORDER — ATORVASTATIN CALCIUM 20 MG PO TABS
20.0000 mg | ORAL_TABLET | Freq: Every day | ORAL | Status: DC
  Administered 2015-09-24 – 2015-09-28 (×4): 20 mg via ORAL
  Filled 2015-09-23 (×4): qty 1

## 2015-09-23 MED ORDER — TIMOLOL HEMIHYDRATE 0.5 % OP SOLN
1.0000 [drp] | Freq: Every day | OPHTHALMIC | Status: DC
  Administered 2015-09-24 – 2015-09-27 (×4): 1 [drp] via OPHTHALMIC
  Filled 2015-09-23: qty 5

## 2015-09-23 MED ORDER — LEVOTHYROXINE SODIUM 125 MCG PO TABS
125.0000 ug | ORAL_TABLET | Freq: Every day | ORAL | Status: DC
  Administered 2015-09-24 – 2015-09-28 (×4): 125 ug via ORAL
  Filled 2015-09-23 (×2): qty 1
  Filled 2015-09-23: qty 3
  Filled 2015-09-23: qty 1

## 2015-09-23 MED ORDER — MORPHINE SULFATE (PF) 4 MG/ML IV SOLN
INTRAVENOUS | Status: AC
  Administered 2015-09-23: 4 mg via INTRAVENOUS
  Filled 2015-09-23: qty 1

## 2015-09-23 MED ORDER — FENTANYL CITRATE (PF) 100 MCG/2ML IJ SOLN: 25.0000 ug | INTRAMUSCULAR | Status: DC | PRN

## 2015-09-23 MED ORDER — LATANOPROST 0.005 % OP SOLN
1.0000 [drp] | Freq: Every day | OPHTHALMIC | Status: DC
  Administered 2015-09-24 – 2015-09-27 (×4): 1 [drp] via OPHTHALMIC
  Filled 2015-09-23: qty 2.5

## 2015-09-23 MED ORDER — DILTIAZEM HCL ER COATED BEADS 240 MG PO CP24
240.0000 mg | ORAL_CAPSULE | Freq: Every day | ORAL | Status: DC
  Administered 2015-09-24 – 2015-09-28 (×5): 240 mg via ORAL
  Filled 2015-09-23 (×5): qty 1

## 2015-09-23 NOTE — Consult Note (Signed)
ORTHOPAEDIC CONSULTATION  REQUESTING PHYSICIAN: Oralia Manis, MD  Chief Complaint: Right hip pain status post fall  HPI: Yolanda Cantu is a 82 y.o. female who complains of  right hip pain after a fall at home today. Patient is brought to the [facility] emergency Department where x-rays confirmed a displaced intertrochanteric right hip fracture.  Patient denies other injuries. She denies numbness and tingling in the right lower extremity. Patient is on Coumadin for atrial fibrillation. Her INR on admission to the [facility] is 2.57.  Past Medical History:  Diagnosis Date  . Arthritis    knees  . Atrial fibrillation (HCC)   . Diabetes mellitus without complication (HCC)   . Hypercholesteremia   . Hypertension   . Hypothyroidism   . Thyroid disease   . Wears dentures    partial lower   Past Surgical History:  Procedure Laterality Date  . ABDOMINAL HYSTERECTOMY    . APPENDECTOMY    . CATARACT EXTRACTION W/PHACO Left 05/09/2015   Procedure: CATARACT EXTRACTION PHACO AND INTRAOCULAR LENS PLACEMENT (IOC) left eye;  Surgeon: Sherald Hess, MD;  Location: [facility];  Service: Ophthalmology;  Laterality: Left;  DIABETIC - oral meds per pt 8 or after for arrival time  . CHOLECYSTECTOMY    . SPLENECTOMY    . SPLENECTOMY    . TONSILLECTOMY    . TOTAL KNEE ARTHROPLASTY Left    Social History   Social History  . Marital status: Single    Spouse name: N/A  . Number of children: N/A  . Years of education: N/A   Social History Main Topics  . Smoking status: Never Smoker  . Smokeless tobacco: Never Used  . Alcohol use No  . Drug use: No  . Sexual activity: Not Asked   Other Topics Concern  . None   Social History Narrative  . None   Family History  Problem Relation Age of Onset  . Diabetes Father   . Stroke Father   . Lung cancer Brother   . Heart disease Mother   . Lung cancer Sister    No Known Allergies Prior to  Admission medications   Medication Sig Start Date End Date Taking? Authorizing Provider  acetaminophen (TYLENOL) 500 MG tablet Take 500 mg by mouth every 6 (six) hours as needed.    Historical Provider, MD  amoxicillin (AMOXIL) 500 MG capsule Take 1 capsule (500 mg total) by mouth 2 (two) times daily. 06/25/15   Payton Mccallum, MD  atorvastatin (LIPITOR) 40 MG tablet Take 20 mg by mouth daily.    Historical Provider, MD  Cyanocobalamin (VITAMIN B-12 IJ) Inject as directed every 30 (thirty) days.    Historical Provider, MD  diltiazem (TIAZAC) 240 MG 24 hr capsule Take 240 mg by mouth daily.    Historical Provider, MD  diphenhydramine-acetaminophen (TYLENOL PM) 25-500 MG TABS tablet Take 2 tablets by mouth at bedtime as needed.    Historical Provider, MD  latanoprost (XALATAN) 0.005 % ophthalmic solution Place 1 drop into both eyes at bedtime.    Historical Provider, MD  levothyroxine (SYNTHROID, LEVOTHROID) 125 MCG tablet Take 125 mcg by mouth daily before breakfast.    Historical Provider, MD  losartan (COZAAR) 50 MG tablet Take 100 mg by mouth daily.     Historical Provider, MD  metFORMIN (GLUCOPHAGE) 500 MG tablet Take 250 mg by mouth 2 (two) times daily with a meal.    Historical Provider, MD  timolol (BETIMOL) 0.5 % ophthalmic  solution Place 1 drop into both eyes daily.    Historical Provider, MD  warfarin (COUMADIN) 2 MG tablet Take 2 mg by mouth daily.    Historical Provider, MD   Dg Chest 1 View  Result Date: 09/23/2015 CLINICAL DATA:  Fall at home, RIGHT hip pain EXAM: CHEST 1 VIEW COMPARISON:  12/28 2006 FINDINGS: Heart is enlarged. Chronic bronchitic markings noted. No effusion, infiltrate pneumothorax. IMPRESSION: Cardiomegaly with chronic bronchitic change.  No acute findings. Electronically Signed   By: Genevive BiStewart  Edmunds M.D.   On: 09/23/2015 22:00   Ct Head Wo Contrast  Result Date: 09/23/2015 CLINICAL DATA:  Pt. States she slipped on rug on stairs at home today. Pt.states she landed  on rt. Hip. Pt. States she was able to get up and ambulate to kitchen. Pt. States she heard "pop" in rt. Hip and went down in kitchen. Pt. States she.*comment was truncated* EXAM: CT HEAD WITHOUT CONTRAST TECHNIQUE: Contiguous axial images were obtained from the base of the skull through the vertex without intravenous contrast. COMPARISON:  None. FINDINGS: Brain: No intracranial hemorrhage. No parenchymal contusion. No midline shift or mass effect. Basilar cisterns are patent. No skull base fracture. No fluid in the paranasal sinuses or mastoid air cells. Orbits are normal. There are periventricular and subcortical white matter hypodensities. Generalized cortical atrophy. Vascular: Negative Skull: No fracture Sinuses/Orbits: Paranasal sinuses and mastoid air cells are clear. Orbits are clear. IMPRESSION: No intracranial trauma. Atrophy and white matter microvascular disease. Electronically Signed   By: Genevive BiStewart  Edmunds M.D.   On: 09/23/2015 22:00   Dg Hip Unilat W Or Wo Pelvis 2-3 Views Right  Result Date: 09/23/2015 CLINICAL DATA:  Fall.  Right hip pain EXAM: DG HIP (WITH OR WITHOUT PELVIS) 2-3V RIGHT COMPARISON:  None. FINDINGS: There is a comminuted, predominantly transverse fracture of the right femoral neck, traversing the right greater trochanter. There is mild proximal displacement. No pelvic fracture is identified. Right femoral head remains seated within the acetabular cup. Limited views of the left hip show no acute abnormality. IMPRESSION: Fracture of the right femoral neck extending to the greater trochanter with mild medial displacement. Right femoral head remains approximated at the hip. Electronically Signed   By: Deatra RobinsonKevin  Herman M.D.   On: 09/23/2015 22:01    Positive ROS: All other systems have been reviewed and were otherwise negative with the exception of those mentioned in the HPI and as above.  Physical Exam: General: Alert, no acute distress  MUSCULOSKELETAL: Right lower extremity:  Patient has shortening and external rotation the right lower extremity. Her skin is intact. She has palpable pedal pulses and intact sensation light touch throughout the right lower extremity. Her thigh and leg compartments are soft and compressible.  Assessment: Displaced right intertrochanteric hip fracture  Plan: I splinted the patient today in the ER that she has sustained a right hip fracture. I'm recommending intramedullary fixation for her injury. She understands that her INR is therapeutic at 2.57. She understands that her INR has to be brought down below 1.5 in order to operate without risking significant bleeding. I'm giving her IV vitamin K 10 mg tonight. She'll be nothing by mouth after midnight in case her INR is reversed overnight. Labs will be redrawn in the morning. Patient understands and agrees with the plan for surgery. She is being admitted to the hospitalist service for preoperative clearance.    Juanell FairlyKRASINSKI, Shambhavi Salley, MD    09/23/2015 11:26 PM

## 2015-09-23 NOTE — ED Provider Notes (Signed)
[facility] Emergency Department Provider Note  Time seen: 8:51 PM  I have reviewed the triage vital signs and the nursing notes.   HISTORY  Chief Complaint Fall (Pt. states mechanical fall in kitchen tonight.  Pt. has rotation of rt. hip and shortning.)    HPI Yolanda Cantu is a 82 y.o. female with a past medical history of atrial fibrillation on Coumadin, diabetes, hypertension, hyperlipidemia, hypothyroidism, who presents to the emergency department from her home after a fall. According to the patient she slipped on her steps earlier today landing on her right hip. States initial pain in the right hip which then improved. Patient states she was walking in her house later this evening when she felt sudden spike of pain in the right hip causing her to fall onto the ground. Denies hitting her head or LOC. States immediate pain to the right hip, unable to bear weight or stand she was able to crawl to the phone to call EMS who brought her to the emergency department. Patient states moderate pain in the right hip, severe with any attempted movement.  Past Medical History:  Diagnosis Date  . Arthritis    knees  . Atrial fibrillation (HCC)   . Diabetes mellitus without complication (HCC)   . Hypercholesteremia   . Hypertension   . Hypothyroidism   . Thyroid disease   . Wears dentures    partial lower    There are no active problems to display for this patient.   Past Surgical History:  Procedure Laterality Date  . ABDOMINAL HYSTERECTOMY    . APPENDECTOMY    . CATARACT EXTRACTION W/PHACO Left 05/09/2015   Procedure: CATARACT EXTRACTION PHACO AND INTRAOCULAR LENS PLACEMENT (IOC) left eye;  Surgeon: Sherald HessAnita Prakash Vin-Parikh, MD;  [facility];  Service: Ophthalmology;  Laterality: Left;  DIABETIC - oral meds per pt 8 or after for arrival time  . CHOLECYSTECTOMY    . SPLENECTOMY    . SPLENECTOMY    . TONSILLECTOMY    . TOTAL KNEE ARTHROPLASTY  Left     Prior to Admission medications   Medication Sig Start Date End Date Taking? Authorizing Provider  acetaminophen (TYLENOL) 500 MG tablet Take 500 mg by mouth every 6 (six) hours as needed.    Historical Provider, MD  amoxicillin (AMOXIL) 500 MG capsule Take 1 capsule (500 mg total) by mouth 2 (two) times daily. 06/25/15   Payton Mccallumrlando Conty, MD  atorvastatin (LIPITOR) 40 MG tablet Take 20 mg by mouth daily.    Historical Provider, MD  Cyanocobalamin (VITAMIN B-12 IJ) Inject as directed every 30 (thirty) days.    Historical Provider, MD  diltiazem (TIAZAC) 240 MG 24 hr capsule Take 240 mg by mouth daily.    Historical Provider, MD  diphenhydramine-acetaminophen (TYLENOL PM) 25-500 MG TABS tablet Take 2 tablets by mouth at bedtime as needed.    Historical Provider, MD  latanoprost (XALATAN) 0.005 % ophthalmic solution Place 1 drop into both eyes at bedtime.    Historical Provider, MD  levothyroxine (SYNTHROID, LEVOTHROID) 125 MCG tablet Take 125 mcg by mouth daily before breakfast.    Historical Provider, MD  losartan (COZAAR) 50 MG tablet Take 100 mg by mouth daily.     Historical Provider, MD  metFORMIN (GLUCOPHAGE) 500 MG tablet Take 250 mg by mouth 2 (two) times daily with a meal.    Historical Provider, MD  timolol (BETIMOL) 0.5 % ophthalmic solution Place 1 drop into both eyes daily.  Historical Provider, MD  warfarin (COUMADIN) 2 MG tablet Take 2 mg by mouth daily.    Historical Provider, MD    No Known Allergies  No family history on file.  Social History Social History  Substance Use Topics  . Smoking status: Never Smoker  . Smokeless tobacco: Not on file  . Alcohol use No    Review of Systems Constitutional: Negative for fever. Cardiovascular: Negative for chest pain. Respiratory: Negative for shortness of breath. Gastrointestinal: Negative for abdominal pain Musculoskeletal: Right hip pain Skin: Negative for rash, abrasion, or laceration. Neurological: Negative for  headache 10-point ROS otherwise negative.  ____________________________________________   PHYSICAL EXAM:  Constitutional: Alert and oriented. Well appearing and in no distress. Eyes: Normal exam ENT   Head: Normocephalic and atraumatic.   Mouth/Throat: Mucous membranes are moist. Cardiovascular: Normal rate, regular rhythm. No murmur Respiratory: Normal respiratory effort without tachypnea nor retractions. Breath sounds are clear  Gastrointestinal: Soft and nontender. No distention.  Musculoskeletal: Pelvis is stable. Mild tenderness to right hip palpation, significant pain elicited with attempted range of motion, sensation intact, 2+ DP pulse. Left leg is normal. Extremities otherwise atraumatic. No C-spine tenderness. Neurologic:  Normal speech and language. No gross focal neurologic deficits  Skin:  Skin is warm, dry and intact.  Psychiatric: Mood and affect are normal. Speech and behavior are normal.   ____________________________________________    EKG  EKG reviewed and interpreted, such as atrial fibrillation at 90 bpm, slightly widened QRS, large within normal axis with nonspecific ST changes, no concerning ST elevation.  ____________________________________________    RADIOLOGY  X-ray consistent right femoral neck fracture. Chest x-ray negative. CT head negative.  ____________________________________________   INITIAL IMPRESSION / ASSESSMENT AND PLAN / ED COURSE  Pertinent labs & imaging results that were available during my care of the patient were reviewed by me and considered in my medical decision making (see chart for details).  The patient presents the emergency department after a fall with significant right hip pain. We'll obtain a CT scan of the patient's head and she is on Coumadin with a significant fall, x-ray of the right hip. Given the patient's exam I highly suspect right hip fracture.  Workup consistent with right femoral neck fracture. I  discussed the patient with Dr. Martha Clan. Dr. Anne Hahn will be admitting. ____________________________________________   FINAL CLINICAL IMPRESSION(S) / ED DIAGNOSES  Right hip fracture    Minna Antis, MD 09/23/15 2231

## 2015-09-23 NOTE — Consult Note (Signed)
  ORTHOPAEDIC CONSULTATION  Aware of patient.  Full consultation to follow.  Patient with intertrochanteric right hip fracture.   Will require intramedullary fixation.  Patient on coumadin for a. fib.  INR 2.57.  Administering IV vitamin K 10mg  tonight.  Recheck INR in AM.  Possible surgery tomorrow or whenever INR is below 1.5.     Juanell FairlyKRASINSKI, Tuwana Kapaun, MD    09/23/2015 10:21 PM

## 2015-09-23 NOTE — H&P (Signed)
[facility] [place] at [facility]   PATIENT NAME: Yolanda Cantu    MR#:  [id number]  DATE OF BIRTH:  17-Oct-1925  DATE OF ADMISSION:  09/23/2015  PRIMARY CARE PHYSICIAN: Marisue Ivan, MD   REQUESTING/REFERRING PHYSICIAN: Lenard Lance, MD  CHIEF COMPLAINT:   Chief Complaint  Patient presents with  . Fall    Pt. states mechanical fall in kitchen tonight.  Pt. has rotation of rt. hip and shortning.    HISTORY OF PRESENT ILLNESS:  Yolanda Cantu  is a 82 y.o. female who presents with 2 mechanical falls today. Patient states that she fell earlier today onto her right side and injured her right hip, which was then painful throughout the day, and led to a second fall in which she fractured her hip. She came to the ED and the same was confirmed on imaging. Hospitals were called for admission.  PAST MEDICAL HISTORY:   Past Medical History:  Diagnosis Date  . Arthritis    knees  . Atrial fibrillation (HCC)   . Diabetes mellitus without complication (HCC)   . Hypercholesteremia   . Hypertension   . Hypothyroidism   . Thyroid disease   . Wears dentures    partial lower    PAST SURGICAL HISTORY:   Past Surgical History:  Procedure Laterality Date  . ABDOMINAL HYSTERECTOMY    . APPENDECTOMY    . CATARACT EXTRACTION W/PHACO Left 05/09/2015   Procedure: CATARACT EXTRACTION PHACO AND INTRAOCULAR LENS PLACEMENT (IOC) left eye;  Surgeon: Sherald Hess, MD;  Location: [facility];  Service: Ophthalmology;  Laterality: Left;  DIABETIC - oral meds per pt 8 or after for arrival time  . CHOLECYSTECTOMY    . SPLENECTOMY    . SPLENECTOMY    . TONSILLECTOMY    . TOTAL KNEE ARTHROPLASTY Left     SOCIAL HISTORY:   Social History  Substance Use Topics  . Smoking status: Never Smoker  . Smokeless tobacco: Never Used  . Alcohol use No    FAMILY HISTORY:   Family History  Problem Relation Age of Onset  . Diabetes Father   . Stroke Father   .  Lung cancer Brother   . Heart disease Mother   . Lung cancer Sister     DRUG ALLERGIES:  No Known Allergies  MEDICATIONS AT HOME:   Prior to Admission medications   Medication Sig Start Date End Date Taking? Authorizing Provider  acetaminophen (TYLENOL) 500 MG tablet Take 500 mg by mouth every 6 (six) hours as needed.    Historical Provider, MD  amoxicillin (AMOXIL) 500 MG capsule Take 1 capsule (500 mg total) by mouth 2 (two) times daily. 06/25/15   Payton Mccallum, MD  atorvastatin (LIPITOR) 40 MG tablet Take 20 mg by mouth daily.    Historical Provider, MD  Cyanocobalamin (VITAMIN B-12 IJ) Inject as directed every 30 (thirty) days.    Historical Provider, MD  diltiazem (TIAZAC) 240 MG 24 hr capsule Take 240 mg by mouth daily.    Historical Provider, MD  diphenhydramine-acetaminophen (TYLENOL PM) 25-500 MG TABS tablet Take 2 tablets by mouth at bedtime as needed.    Historical Provider, MD  latanoprost (XALATAN) 0.005 % ophthalmic solution Place 1 drop into both eyes at bedtime.    Historical Provider, MD  levothyroxine (SYNTHROID, LEVOTHROID) 125 MCG tablet Take 125 mcg by mouth daily before breakfast.    Historical Provider, MD  losartan (COZAAR) 50 MG tablet Take 100 mg by mouth daily.  Historical Provider, MD  metFORMIN (GLUCOPHAGE) 500 MG tablet Take 250 mg by mouth 2 (two) times daily with a meal.    Historical Provider, MD  timolol (BETIMOL) 0.5 % ophthalmic solution Place 1 drop into both eyes daily.    Historical Provider, MD  warfarin (COUMADIN) 2 MG tablet Take 2 mg by mouth daily.    Historical Provider, MD    REVIEW OF SYSTEMS:  Review of Systems  Constitutional: Negative for chills, fever, malaise/fatigue and weight loss.  HENT: Negative for ear pain, hearing loss and tinnitus.   Eyes: Negative for blurred vision, double vision, pain and redness.  Respiratory: Negative for cough, hemoptysis and shortness of breath.   Cardiovascular: Negative for chest pain,  palpitations, orthopnea and leg swelling.  Gastrointestinal: Negative for abdominal pain, constipation, diarrhea, nausea and vomiting.  Genitourinary: Negative for dysuria, frequency and hematuria.  Musculoskeletal: Positive for falls and joint pain (right hip). Negative for back pain and neck pain.  Skin:       No acne, rash, or lesions  Neurological: Negative for dizziness, tremors, focal weakness and weakness.  Endo/Heme/Allergies: Negative for polydipsia. Does not bruise/bleed easily.  Psychiatric/Behavioral: Negative for depression. The patient is not nervous/anxious and does not have insomnia.      VITAL SIGNS:   Vitals:   09/23/15 2056  BP: (!) 151/100  Pulse: 63  Temp: 98.2 F (36.8 C)  TempSrc: Oral  SpO2: 92%  Weight: 77.1 kg (170 lb)  Height: 5\' 6"  (1.676 m)   Wt Readings from Last 3 Encounters:  09/23/15 77.1 kg (170 lb)  06/25/15 77.1 kg (170 lb)  05/04/15 79.4 kg (175 lb)    PHYSICAL EXAMINATION:  Physical Exam  Vitals reviewed. Constitutional: She is oriented to person, place, and time. She appears well-developed and well-nourished. No distress.  HENT:  Head: Normocephalic and atraumatic.  Mouth/Throat: Oropharynx is clear and moist.  Eyes: Conjunctivae and EOM are normal. Pupils are equal, round, and reactive to light. No scleral icterus.  Neck: Normal range of motion. Neck supple. No JVD present. No thyromegaly present.  Cardiovascular: Intact distal pulses.  Exam reveals no gallop and no friction rub.   No murmur heard. Tachycardic, irregular rhythm  Respiratory: Effort normal and breath sounds normal. No respiratory distress. She has no wheezes. She has no rales.  GI: Soft. Bowel sounds are normal. She exhibits no distension. There is no tenderness.  Musculoskeletal: She exhibits tenderness (right hip). She exhibits no edema.  No arthritis, no gout  Lymphadenopathy:    She has no cervical adenopathy.  Neurological: She is alert and oriented to  person, place, and time. No cranial nerve deficit.  No dysarthria, no aphasia  Skin: Skin is warm and dry. No rash noted. No erythema.  Psychiatric: She has a normal mood and affect. Her behavior is normal. Judgment and thought content normal.    LABORATORY PANEL:   CBC  Recent Labs Lab 09/23/15 2059  WBC 10.7  HGB 11.5*  HCT 33.8*  PLT 294   ------------------------------------------------------------------------------------------------------------------  Chemistries   Recent Labs Lab 09/23/15 2059  NA 130*  K 3.5  CL 100*  CO2 23  GLUCOSE 122*  BUN 12  CREATININE 0.55  CALCIUM 8.7*   ------------------------------------------------------------------------------------------------------------------  Cardiac Enzymes No results for input(s): TROPONINI in the last 168 hours. ------------------------------------------------------------------------------------------------------------------  RADIOLOGY:  Dg Chest 1 View  Result Date: 09/23/2015 CLINICAL DATA:  Fall at home, RIGHT hip pain EXAM: CHEST 1 VIEW COMPARISON:  12/28 2006 FINDINGS: Heart  is enlarged. Chronic bronchitic markings noted. No effusion, infiltrate pneumothorax. IMPRESSION: Cardiomegaly with chronic bronchitic change.  No acute findings. Electronically Signed   By: Genevive Bi M.D.   On: 09/23/2015 22:00   Ct Head Wo Contrast  Result Date: 09/23/2015 CLINICAL DATA:  Pt. States she slipped on rug on stairs at home today. Pt.states she landed on rt. Hip. Pt. States she was able to get up and ambulate to kitchen. Pt. States she heard "pop" in rt. Hip and went down in kitchen. Pt. States she.*comment was truncated* EXAM: CT HEAD WITHOUT CONTRAST TECHNIQUE: Contiguous axial images were obtained from the base of the skull through the vertex without intravenous contrast. COMPARISON:  None. FINDINGS: Brain: No intracranial hemorrhage. No parenchymal contusion. No midline shift or mass effect. Basilar cisterns  are patent. No skull base fracture. No fluid in the paranasal sinuses or mastoid air cells. Orbits are normal. There are periventricular and subcortical white matter hypodensities. Generalized cortical atrophy. Vascular: Negative Skull: No fracture Sinuses/Orbits: Paranasal sinuses and mastoid air cells are clear. Orbits are clear. IMPRESSION: No intracranial trauma. Atrophy and white matter microvascular disease. Electronically Signed   By: Genevive Bi M.D.   On: 09/23/2015 22:00   Dg Hip Unilat W Or Wo Pelvis 2-3 Views Right  Result Date: 09/23/2015 CLINICAL DATA:  Fall.  Right hip pain EXAM: DG HIP (WITH OR WITHOUT PELVIS) 2-3V RIGHT COMPARISON:  None. FINDINGS: There is a comminuted, predominantly transverse fracture of the right femoral neck, traversing the right greater trochanter. There is mild proximal displacement. No pelvic fracture is identified. Right femoral head remains seated within the acetabular cup. Limited views of the left hip show no acute abnormality. IMPRESSION: Fracture of the right femoral neck extending to the greater trochanter with mild medial displacement. Right femoral head remains approximated at the hip. Electronically Signed   By: Deatra Robinson M.D.   On: 09/23/2015 22:01    EKG:   Orders placed or performed during the hospital encounter of 09/23/15  . ED EKG  . ED EKG  . EKG 12-Lead  . EKG 12-Lead    IMPRESSION AND PLAN:  Principal Problem:   Closed right hip fracture [facility]) - consult orthopedic surgery for repair of the same. Pain control ordered. Patient has A. fib and is on warfarin, she is currently therapeutic level with her INR. We are holding the warfarin tonight we'll recheck her INR in the morning. Active Problems:   Diabetes (HCC) - on a scale insulin with corresponding glucose checks every 6 hours for now while patient is nothing by mouth in anticipation of possible surgery tomorrow, likely depending on her INR   HTN (hypertension) - early stable,  continue home meds   Atrial fibrillation (HCC) - continue home rate controlling medicines, hold warfarin for now as above.   Hypothyroidism - home dose thyroid replacement  All the records are reviewed and case discussed with ED provider. Management plans discussed with the patient and/or family.  DVT PROPHYLAXIS: Systemic anticoagulation  GI PROPHYLAXIS: None  ADMISSION STATUS: Inpatient  CODE STATUS: Full Code Status History    This patient does not have a recorded code status. Please follow your organizational policy for patients in this situation.      TOTAL TIME TAKING CARE OF THIS PATIENT: 45 minutes.    Shaynah Hund FIELDING 09/23/2015, 10:18 PM  [facility]  Office  [phone]  CC: Primary care physician; Marisue Ivan, MD

## 2015-09-23 NOTE — ED Triage Notes (Signed)
Pt. States she slipped on rug on stairs at home today.  Pt.states she landed on rt. Hip.  Pt. States she was able to get up and ambulate to kitchen.  Pt. States she heard "pop" in rt. Hip and went down in kitchen.  Pt. States she was able to crawl to living room and call neighbor. Pt. Has rotation of rt. Hip.

## 2015-09-24 ENCOUNTER — Inpatient Hospital Stay: Payer: Medicare Other

## 2015-09-24 LAB — PROTIME-INR
INR: 2.59
INR: 1.14
PROTHROMBIN TIME: 14.7 s (ref 11.4–15.2)
Prothrombin Time: 28.3 seconds — ABNORMAL HIGH (ref 11.4–15.2)

## 2015-09-24 LAB — GLUCOSE, CAPILLARY
GLUCOSE-CAPILLARY: 114 mg/dL — AB (ref 65–99)
GLUCOSE-CAPILLARY: 117 mg/dL — AB (ref 65–99)
GLUCOSE-CAPILLARY: 121 mg/dL — AB (ref 65–99)
GLUCOSE-CAPILLARY: 133 mg/dL — AB (ref 65–99)
Glucose-Capillary: 133 mg/dL — ABNORMAL HIGH (ref 65–99)
Glucose-Capillary: 117 mg/dL — ABNORMAL HIGH (ref 65–99)
Glucose-Capillary: 124 mg/dL — ABNORMAL HIGH (ref 65–99)

## 2015-09-24 LAB — BASIC METABOLIC PANEL
Anion gap: 7 (ref 5–15)
BUN: 9 mg/dL (ref 6–20)
CALCIUM: 8.1 mg/dL — AB (ref 8.9–10.3)
CO2: 22 mmol/L (ref 22–32)
CREATININE: 0.47 mg/dL (ref 0.44–1.00)
Chloride: 101 mmol/L (ref 101–111)
Glucose, Bld: 127 mg/dL — ABNORMAL HIGH (ref 65–99)
Potassium: 3.4 mmol/L — ABNORMAL LOW (ref 3.5–5.1)
SODIUM: 130 mmol/L — AB (ref 135–145)

## 2015-09-24 LAB — ALBUMIN: Albumin: 3.8 g/dL (ref 3.5–5.0)

## 2015-09-24 LAB — CBC
HCT: 33.3 % — ABNORMAL LOW (ref 35.0–47.0)
Hemoglobin: 11.1 g/dL — ABNORMAL LOW (ref 12.0–16.0)
MCH: 30.6 pg (ref 26.0–34.0)
MCHC: 33.5 g/dL (ref 32.0–36.0)
MCV: 91.3 fL (ref 80.0–100.0)
PLATELETS: 283 10*3/uL (ref 150–440)
RBC: 3.64 MIL/uL — ABNORMAL LOW (ref 3.80–5.20)
RDW: 14.9 % — AB (ref 11.5–14.5)
WBC: 15.7 10*3/uL — ABNORMAL HIGH (ref 3.6–11.0)

## 2015-09-24 LAB — TYPE AND SCREEN
ABO/RH(D): A POS
ANTIBODY SCREEN: NEGATIVE

## 2015-09-24 LAB — SURGICAL PCR SCREEN
MRSA, PCR: NEGATIVE
STAPHYLOCOCCUS AUREUS: NEGATIVE

## 2015-09-24 LAB — APTT: APTT: 40 s — AB (ref 24–36)

## 2015-09-24 LAB — MAGNESIUM: Magnesium: 1.6 mg/dL — ABNORMAL LOW (ref 1.7–2.4)

## 2015-09-24 MED ORDER — HYDROMORPHONE HCL 1 MG/ML IJ SOLN
0.5000 mg | Freq: Once | INTRAMUSCULAR | Status: AC
  Administered 2015-09-24: 0.5 mg via INTRAVENOUS

## 2015-09-24 MED ORDER — OXYCODONE HCL 5 MG PO TABS
10.0000 mg | ORAL_TABLET | ORAL | Status: DC | PRN
  Administered 2015-09-24 (×2): 15 mg via ORAL
  Filled 2015-09-24 (×2): qty 3

## 2015-09-24 MED ORDER — OXYCODONE HCL 5 MG PO TABS: 10.0000 mg | ORAL_TABLET | ORAL | Status: DC | PRN

## 2015-09-24 MED ORDER — VITAMIN K1 10 MG/ML IJ SOLN
10.0000 mg | Freq: Once | INTRAVENOUS | Status: AC
  Administered 2015-09-24: 10 mg via INTRAVENOUS
  Filled 2015-09-24: qty 1

## 2015-09-24 MED ORDER — POTASSIUM CHLORIDE CRYS ER 20 MEQ PO TBCR
40.0000 meq | EXTENDED_RELEASE_TABLET | Freq: Once | ORAL | Status: AC
  Administered 2015-09-24: 40 meq via ORAL
  Filled 2015-09-24: qty 2

## 2015-09-24 NOTE — Progress Notes (Signed)
Pt. Crying in pain. Dr. Sheryle Hailiamond ordered 0.5mg  dilaudid iv one extra dose

## 2015-09-24 NOTE — Progress Notes (Signed)
Subjective:  Yolanda Cantu seen in her hospital room. She denies any significant right hip pain but is complaining of right knee pain. She is unsure whether she may have twisted or injured the knee during her fall at home yesterday. Yolanda Cantu denies any right lower extremity numbness tingling or muscle spasms.  Objective:   VITALS:   Vitals:   09/24/15 0520 09/24/15 0759 09/24/15 1115 09/24/15 1453  BP: 133/65 125/70 (!) 146/76 (!) 111/57  Pulse: 64 (!) 102 (!) 111 64  Resp: 17 20 20 18   Temp: 98.4 F (36.9 C) 98 F (36.7 C) 97.8 F (36.6 C) 97.4 F (36.3 C)  TempSrc: Oral Oral Oral Oral  SpO2: 100% 99% 98% 92%  Weight:      Height:        PHYSICAL EXAM:  Right lower extremity: Yolanda Cantu's skin remains intact. There is no significant erythema or ecchymosis or swelling. Her right knee does not demonstrate any erythema or ecchymosis or significant effusion. She has a pillow under the right knee for comfort. Distally she has no calf tenderness or Homans sign. She has palpable pedal pulses, intact sensation light touch and can flex and extend her toes and dorsiflex and plantar flex her ankle.   LABS  Results for orders placed or performed during the hospital encounter of 09/23/15 (from the past 24 hour(s))  CBC     Status: Abnormal   Collection Time: 09/23/15  8:59 PM  Result Value Ref Range   WBC 10.7 3.6 - 11.0 K/uL   RBC 3.73 (L) 3.80 - 5.20 MIL/uL   Hemoglobin 11.5 (L) 12.0 - 16.0 g/dL   HCT 01.0 (L) 27.2 - 53.6 %   MCV 90.6 80.0 - 100.0 fL   MCH 30.9 26.0 - 34.0 pg   MCHC 34.1 32.0 - 36.0 g/dL   RDW 64.4 (H) 03.4 - 74.2 %   Platelets 294 150 - 440 K/uL  Basic metabolic panel     Status: Abnormal   Collection Time: 09/23/15  8:59 PM  Result Value Ref Range   Sodium 130 (L) 135 - 145 mmol/L   Potassium 3.5 3.5 - 5.1 mmol/L   Chloride 100 (L) 101 - 111 mmol/L   CO2 23 22 - 32 mmol/L   Glucose, Bld 122 (H) 65 - 99 mg/dL   BUN 12 6 - 20 mg/dL   Creatinine, Ser 5.95 0.44 - 1.00  mg/dL   Calcium 8.7 (L) 8.9 - 10.3 mg/dL   GFR calc non Af Amer >60 >60 mL/min   GFR calc Af Amer >60 >60 mL/min   Anion gap 7 5 - 15  Protime-INR     Status: Abnormal   Collection Time: 09/23/15  8:59 PM  Result Value Ref Range   Prothrombin Time 28.1 (H) 11.4 - 15.2 seconds   INR 2.57   Albumin     Status: None   Collection Time: 09/23/15  8:59 PM  Result Value Ref Range   Albumin 3.8 3.5 - 5.0 g/dL  Urinalysis complete, with microscopic ([facility] only)     Status: Abnormal   Collection Time: 09/23/15 11:09 PM  Result Value Ref Range   Color, Urine STRAW (A) YELLOW   APPearance CLEAR (A) CLEAR   Glucose, UA NEGATIVE NEGATIVE mg/dL   Bilirubin Urine NEGATIVE NEGATIVE   Ketones, ur NEGATIVE NEGATIVE mg/dL   Specific Gravity, Urine 1.008 1.005 - 1.030   Hgb urine dipstick 2+ (A) NEGATIVE   pH 6.0 5.0 - 8.0   Protein, ur  30 (A) NEGATIVE mg/dL   Nitrite NEGATIVE NEGATIVE   Leukocytes, UA NEGATIVE NEGATIVE   RBC / HPF 0-5 0 - 5 RBC/hpf   WBC, UA 0-5 0 - 5 WBC/hpf   Bacteria, UA NONE SEEN NONE SEEN   Squamous Epithelial / LPF NONE SEEN NONE SEEN  Surgical PCR screen     Status: None   Collection Time: 09/24/15 12:28 AM  Result Value Ref Range   MRSA, PCR NEGATIVE NEGATIVE   Staphylococcus aureus NEGATIVE NEGATIVE  Glucose, capillary     Status: Abnormal   Collection Time: 09/24/15 12:29 AM  Result Value Ref Range   Glucose-Capillary 117 (H) 65 - 99 mg/dL  CBC     Status: Abnormal   Collection Time: 09/24/15 12:36 AM  Result Value Ref Range   WBC 15.7 (H) 3.6 - 11.0 K/uL   RBC 3.64 (L) 3.80 - 5.20 MIL/uL   Hemoglobin 11.1 (L) 12.0 - 16.0 g/dL   HCT 16.1 (L) 09.6 - 04.5 %   MCV 91.3 80.0 - 100.0 fL   MCH 30.6 26.0 - 34.0 pg   MCHC 33.5 32.0 - 36.0 g/dL   RDW 40.9 (H) 81.1 - 91.4 %   Platelets 283 150 - 440 K/uL  Basic metabolic panel     Status: Abnormal   Collection Time: 09/24/15 12:36 AM  Result Value Ref Range   Sodium 130 (L) 135 - 145 mmol/L   Potassium 3.4 (L) 3.5  - 5.1 mmol/L   Chloride 101 101 - 111 mmol/L   CO2 22 22 - 32 mmol/L   Glucose, Bld 127 (H) 65 - 99 mg/dL   BUN 9 6 - 20 mg/dL   Creatinine, Ser 7.82 0.44 - 1.00 mg/dL   Calcium 8.1 (L) 8.9 - 10.3 mg/dL   GFR calc non Af Amer >60 >60 mL/min   GFR calc Af Amer >60 >60 mL/min   Anion gap 7 5 - 15  Type and screen If not already done in ED     Status: None   Collection Time: 09/24/15 12:36 AM  Result Value Ref Range   ABO/RH(D) A POS    Antibody Screen NEG    Sample Expiration 09/27/2015   Protime-INR     Status: Abnormal   Collection Time: 09/24/15 12:36 AM  Result Value Ref Range   Prothrombin Time 28.3 (H) 11.4 - 15.2 seconds   INR 2.59   APTT     Status: Abnormal   Collection Time: 09/24/15 12:36 AM  Result Value Ref Range   aPTT 40 (H) 24 - 36 seconds  Magnesium     Status: Abnormal   Collection Time: 09/24/15 12:36 AM  Result Value Ref Range   Magnesium 1.6 (L) 1.7 - 2.4 mg/dL  Glucose, capillary     Status: Abnormal   Collection Time: 09/24/15  5:20 AM  Result Value Ref Range   Glucose-Capillary 133 (H) 65 - 99 mg/dL  Glucose, capillary     Status: Abnormal   Collection Time: 09/24/15  7:26 AM  Result Value Ref Range   Glucose-Capillary 114 (H) 65 - 99 mg/dL  Glucose, capillary     Status: Abnormal   Collection Time: 09/24/15 11:15 AM  Result Value Ref Range   Glucose-Capillary 117 (H) 65 - 99 mg/dL  Glucose, capillary     Status: Abnormal   Collection Time: 09/24/15  3:29 PM  Result Value Ref Range   Glucose-Capillary 124 (H) 65 - 99 mg/dL    Dg Chest  1 View  Result Date: 09/23/2015 CLINICAL DATA:  Fall at home, RIGHT hip pain EXAM: CHEST 1 VIEW COMPARISON:  12/28 2006 FINDINGS: Heart is enlarged. Chronic bronchitic markings noted. No effusion, infiltrate pneumothorax. IMPRESSION: Cardiomegaly with chronic bronchitic change.  No acute findings. Electronically Signed   By: Genevive BiStewart  Edmunds M.D.   On: 09/23/2015 22:00   Ct Head Wo Contrast  Result Date:  09/23/2015 CLINICAL DATA:  Pt. States she slipped on rug on stairs at home today. Pt.states she landed on rt. Hip. Pt. States she was able to get up and ambulate to kitchen. Pt. States she heard "pop" in rt. Hip and went down in kitchen. Pt. States she.*comment was truncated* EXAM: CT HEAD WITHOUT CONTRAST TECHNIQUE: Contiguous axial images were obtained from the base of the skull through the vertex without intravenous contrast. COMPARISON:  None. FINDINGS: Brain: No intracranial hemorrhage. No parenchymal contusion. No midline shift or mass effect. Basilar cisterns are patent. No skull base fracture. No fluid in the paranasal sinuses or mastoid air cells. Orbits are normal. There are periventricular and subcortical white matter hypodensities. Generalized cortical atrophy. Vascular: Negative Skull: No fracture Sinuses/Orbits: Paranasal sinuses and mastoid air cells are clear. Orbits are clear. IMPRESSION: No intracranial trauma. Atrophy and white matter microvascular disease. Electronically Signed   By: Genevive BiStewart  Edmunds M.D.   On: 09/23/2015 22:00   Dg Hip Unilat W Or Wo Pelvis 2-3 Views Right  Result Date: 09/23/2015 CLINICAL DATA:  Fall.  Right hip pain EXAM: DG HIP (WITH OR WITHOUT PELVIS) 2-3V RIGHT COMPARISON:  None. FINDINGS: There is a comminuted, predominantly transverse fracture of the right femoral neck, traversing the right greater trochanter. There is mild proximal displacement. No pelvic fracture is identified. Right femoral head remains seated within the acetabular cup. Limited views of the left hip show no acute abnormality. IMPRESSION: Fracture of the right femoral neck extending to the greater trochanter with mild medial displacement. Right femoral head remains approximated at the hip. Electronically Signed   By: Deatra RobinsonKevin  Herman M.D.   On: 09/23/2015 22:01    Assessment/Plan:     Principal Problem:   Closed right hip fracture (HCC) Active Problems:   Diabetes (HCC)   HTN (hypertension)    Hypothyroidism   Atrial fibrillation [facility](HCC)  Yolanda Cantu is on Coumadin for atrial fibrillation. Her INR today is still 2.59. This is despite a dose of IV vitamin K. I'm going to order another dose of IV vitamin K today. Her INR will be rechecked in the morning. Yolanda Cantu be ordered for ice to be applied to the right knee. I am oing to order an x-ray of the right knee as well to ensure there is no secondary injury there. Yolanda Cantu is currently on the OR schedule tomorrow pending her INR result. She'll be nothing by mouth after midnight. Continue current pain management. I marked the right hip surgical site according the hospital's correct site of surgery protocol.    Juanell FairlyKRASINSKI, Trentyn Boisclair , MD 09/24/2015, 3:46 PM

## 2015-09-24 NOTE — Progress Notes (Signed)
Dr.Krasinski paged to notify of pt inr of 1.14. Page returned and MD stated to continue with inr check in am and if still below 1.5 will proceed with surgery

## 2015-09-24 NOTE — Progress Notes (Addendum)
[employer] [facility] - [place] at [facility]   PATIENT NAME: Yolanda Cantu    MR#:  [id number]  DATE OF BIRTH:  07/10/1925  SUBJECTIVE:  CHIEF COMPLAINT:   Chief Complaint  Patient presents with  . Fall    Pt. states mechanical fall in kitchen tonight.  Pt. has rotation of rt. hip and shortning.   Right hip pain. REVIEW OF SYSTEMS:  Review of Systems  Constitutional: Negative for chills, fever and malaise/fatigue.  HENT: Negative for congestion.   Eyes: Negative for blurred vision and double vision.  Respiratory: Negative for cough, shortness of breath and wheezing.   Cardiovascular: Negative for chest pain, palpitations and leg swelling.  Gastrointestinal: Negative for abdominal pain, blood in stool, constipation, diarrhea, melena, nausea and vomiting.  Genitourinary: Negative for dysuria and urgency.  Musculoskeletal: Positive for joint pain.  Neurological: Negative for dizziness, focal weakness and headaches.  Psychiatric/Behavioral: Negative for depression.    DRUG ALLERGIES:  No Active Allergies VITALS:  Blood pressure (!) 146/76, pulse (!) 111, temperature 97.8 F (36.6 C), temperature source Oral, resp. rate 20, height 5\' 6"  (1.676 m), weight 172 lb 14.4 oz (78.4 kg), SpO2 98 %. PHYSICAL EXAMINATION:  Physical Exam  Constitutional: She is oriented to person, place, and time and well-developed, well-nourished, and in no distress. No distress.  HENT:  Head: Normocephalic and atraumatic.  Mouth/Throat: Oropharynx is clear and moist.  Eyes: Conjunctivae and EOM are normal. Pupils are equal, round, and reactive to light.  Neck: Normal range of motion. Neck supple. No JVD present.  Cardiovascular: Normal rate, regular rhythm and normal heart sounds.  Exam reveals no gallop.   No murmur heard. Pulmonary/Chest: No respiratory distress. She has no wheezes. She has no rales.  Abdominal: She exhibits no distension. There is no tenderness.  Musculoskeletal: She exhibits  no edema or tenderness.  Neurological: She is alert and oriented to person, place, and time.  Skin: Skin is warm.  Psychiatric: Affect normal.   LABORATORY PANEL:   CBC  Recent Labs Lab 09/24/15 0036  WBC 15.7*  HGB 11.1*  HCT 33.3*  PLT 283   ------------------------------------------------------------------------------------------------------------------ Chemistries   Recent Labs Lab 09/24/15 0036  NA 130*  K 3.4*  CL 101  CO2 22  GLUCOSE 127*  BUN 9  CREATININE 0.47  CALCIUM 8.1*  MG 1.6*   RADIOLOGY:  Dg Chest 1 View  Result Date: 09/23/2015 CLINICAL DATA:  Fall at home, RIGHT hip pain EXAM: CHEST 1 VIEW COMPARISON:  12/28 2006 FINDINGS: Heart is enlarged. Chronic bronchitic markings noted. No effusion, infiltrate pneumothorax. IMPRESSION: Cardiomegaly with chronic bronchitic change.  No acute findings. Electronically Signed   By: Genevive BiStewart  Edmunds M.D.   On: 09/23/2015 22:00   Ct Head Wo Contrast  Result Date: 09/23/2015 CLINICAL DATA:  Pt. States she slipped on rug on stairs at home today. Pt.states she landed on rt. Hip. Pt. States she was able to get up and ambulate to kitchen. Pt. States she heard "pop" in rt. Hip and went down in kitchen. Pt. States she.*comment was truncated* EXAM: CT HEAD WITHOUT CONTRAST TECHNIQUE: Contiguous axial images were obtained from the base of the skull through the vertex without intravenous contrast. COMPARISON:  None. FINDINGS: Brain: No intracranial hemorrhage. No parenchymal contusion. No midline shift or mass effect. Basilar cisterns are patent. No skull base fracture. No fluid in the paranasal sinuses or mastoid air cells. Orbits are normal. There are periventricular and subcortical white matter hypodensities. Generalized cortical atrophy.  Vascular: Negative Skull: No fracture Sinuses/Orbits: Paranasal sinuses and mastoid air cells are clear. Orbits are clear. IMPRESSION: No intracranial trauma. Atrophy and white matter microvascular  disease. Electronically Signed   By: Genevive Bi M.D.   On: 09/23/2015 22:00   Dg Hip Unilat W Or Wo Pelvis 2-3 Views Right  Result Date: 09/23/2015 CLINICAL DATA:  Fall.  Right hip pain EXAM: DG HIP (WITH OR WITHOUT PELVIS) 2-3V RIGHT COMPARISON:  None. FINDINGS: There is a comminuted, predominantly transverse fracture of the right femoral neck, traversing the right greater trochanter. There is mild proximal displacement. No pelvic fracture is identified. Right femoral head remains seated within the acetabular cup. Limited views of the left hip show no acute abnormality. IMPRESSION: Fracture of the right femoral neck extending to the greater trochanter with mild medial displacement. Right femoral head remains approximated at the hip. Electronically Signed   By: Deatra Robinson M.D.   On: 09/23/2015 22:01   ASSESSMENT AND PLAN:   Closed right hip fracture. Pain control,  holding the warfarin, INR s/p Vit K, still high. F/u INR in am. Follow-up with Dr. Martha Clan for hip surgery once INR down to 1.5.    Diabetes (HCC) - on sliding scale.    HTN (hypertension) - early stable, continue home meds   Atrial fibrillation (HCC) - continue Cardizem, hold warfarin for now as above.    Hypothyroidism - home dose thyroid replacement  Leukocytosis. Due to reaction. Follow-up CBC. Hypomagnesemia. Give IV magnesium and a follow-up level. Hypokalemia. Give potassium supplement and a follow-up level. Hyponatremia, continue normal saline IV and follow-up BMP.  All the records are reviewed and case discussed with Care Management/Social Worker. Management plans discussed with the patient, family and they are in agreement.  CODE STATUS: Full code  TOTAL TIME TAKING CARE OF THIS PATIENT: 37 minutes.   More than 50% of the time was spent in counseling/coordination of care: YES  POSSIBLE D/C IN 3-4 DAYS, DEPENDING ON CLINICAL CONDITION.   Shaune Pollack M.D on 09/24/2015 at 2:04 PM  Between 7am to 6pm -  Pager - [phone]  After 6pm go to [email or web address] - Social research officer, government  [facility] [place] [facility]  Office  [phone]  CC: Primary care physician; Marisue Ivan, MD  Note: This dictation was prepared with Dragon dictation along with smaller phrase technology. Any transcriptional errors that result from this process are unintentional.

## 2015-09-24 NOTE — Progress Notes (Signed)
Dr Imogene Burnchen on rounds notified that pt reports dr Charlton Hawslithanvong stopped cozaar on Tuesday. Refuses med

## 2015-09-25 ENCOUNTER — Inpatient Hospital Stay: Payer: Medicare Other | Admitting: Anesthesiology

## 2015-09-25 ENCOUNTER — Inpatient Hospital Stay: Payer: Medicare Other

## 2015-09-25 ENCOUNTER — Encounter: Payer: Self-pay | Admitting: Anesthesiology

## 2015-09-25 ENCOUNTER — Encounter
Admission: EM | Disposition: A | Discharge status: Skilled Nursing Facility | Payer: Self-pay | Source: Home / Self Care | Attending: Internal Medicine

## 2015-09-25 HISTORY — PX: INTRAMEDULLARY (IM) NAIL INTERTROCHANTERIC: SHX5875

## 2015-09-25 LAB — SYNOVIAL CELL COUNT + DIFF, W/ CRYSTALS
CRYSTALS FLUID: NONE SEEN
Eosinophils-Synovial: 0 %
LYMPHOCYTES-SYNOVIAL FLD: 1 %
Monocyte-Macrophage-Synovial Fluid: 0 %
NEUTROPHIL, SYNOVIAL: 99 %
Other Cells-SYN: 0
WBC, SYNOVIAL: 62746 /mm3 — AB (ref 0–200)

## 2015-09-25 LAB — CBC
HCT: 32.7 % — ABNORMAL LOW (ref 35.0–47.0)
Hemoglobin: 10.9 g/dL — ABNORMAL LOW (ref 12.0–16.0)
MCH: 30.8 pg (ref 26.0–34.0)
MCHC: 33.3 g/dL (ref 32.0–36.0)
MCV: 92.3 fL (ref 80.0–100.0)
Platelets: 267 10*3/uL (ref 150–440)
RBC: 3.54 MIL/uL — AB (ref 3.80–5.20)
RDW: 15.5 % — AB (ref 11.5–14.5)
WBC: 13.3 10*3/uL — AB (ref 3.6–11.0)

## 2015-09-25 LAB — GLUCOSE, CAPILLARY
GLUCOSE-CAPILLARY: 106 mg/dL — AB (ref 65–99)
GLUCOSE-CAPILLARY: 115 mg/dL — AB (ref 65–99)
GLUCOSE-CAPILLARY: 67 mg/dL (ref 65–99)
Glucose-Capillary: 120 mg/dL — ABNORMAL HIGH (ref 65–99)
Glucose-Capillary: 137 mg/dL — ABNORMAL HIGH (ref 65–99)
Glucose-Capillary: 98 mg/dL (ref 65–99)

## 2015-09-25 LAB — PROTIME-INR
INR: 1.13
PROTHROMBIN TIME: 14.6 s (ref 11.4–15.2)

## 2015-09-25 LAB — BASIC METABOLIC PANEL
ANION GAP: 6 (ref 5–15)
BUN: 8 mg/dL (ref 6–20)
CALCIUM: 8.5 mg/dL — AB (ref 8.9–10.3)
CO2: 29 mmol/L (ref 22–32)
Chloride: 96 mmol/L — ABNORMAL LOW (ref 101–111)
Creatinine, Ser: 0.68 mg/dL (ref 0.44–1.00)
GFR calc Af Amer: >60 mL/min (ref >60)
GFR calc non Af Amer: >60 mL/min (ref >60)
GLUCOSE: 117 mg/dL — AB (ref 65–99)
Potassium: 4.1 mmol/L (ref 3.5–5.1)
Sodium: 131 mmol/L — ABNORMAL LOW (ref 135–145)

## 2015-09-25 LAB — MAGNESIUM: Magnesium: 1.8 mg/dL (ref 1.7–2.4)

## 2015-09-25 SURGERY — FIXATION, FRACTURE, INTERTROCHANTERIC, WITH INTRAMEDULLARY ROD
Anesthesia: Spinal | Laterality: Right

## 2015-09-25 MED ORDER — WARFARIN SODIUM 1 MG PO TABS: 4.0000 mg | ORAL_TABLET | Freq: Once | ORAL | Status: DC

## 2015-09-25 MED ORDER — WARFARIN SODIUM 5 MG PO TABS
5.0000 mg | ORAL_TABLET | Freq: Once | ORAL | Status: AC
  Administered 2015-09-25: 5 mg via ORAL
  Filled 2015-09-25 (×2): qty 1

## 2015-09-25 MED ORDER — PROPOFOL 500 MG/50ML IV EMUL
INTRAVENOUS | Status: DC | PRN
  Administered 2015-09-25: 50 ug/kg/min via INTRAVENOUS

## 2015-09-25 MED ORDER — CEFAZOLIN SODIUM-DEXTROSE 2-4 GM/100ML-% IV SOLN
2.0000 g | Freq: Four times a day (QID) | INTRAVENOUS | Status: AC
  Administered 2015-09-25 – 2015-09-26 (×2): 2 g via INTRAVENOUS
  Filled 2015-09-25 (×2): qty 100

## 2015-09-25 MED ORDER — MAGNESIUM CITRATE PO SOLN
1.0000 | Freq: Once | ORAL | Status: AC | PRN
  Administered 2015-09-27: 1 via ORAL
  Filled 2015-09-25 (×2): qty 296

## 2015-09-25 MED ORDER — DOCUSATE SODIUM 100 MG PO CAPS
100.0000 mg | ORAL_CAPSULE | Freq: Two times a day (BID) | ORAL | Status: DC
  Administered 2015-09-25 – 2015-09-28 (×6): 100 mg via ORAL
  Filled 2015-09-25 (×6): qty 1

## 2015-09-25 MED ORDER — HYDROMORPHONE HCL 1 MG/ML IJ SOLN
0.5000 mg | INTRAMUSCULAR | Status: DC | PRN
  Administered 2015-09-25 – 2015-09-26 (×2): 0.5 mg via INTRAVENOUS
  Filled 2015-09-25 (×2): qty 1

## 2015-09-25 MED ORDER — POLYETHYLENE GLYCOL 3350 17 G PO PACK
17.0000 g | PACK | Freq: Every day | ORAL | Status: DC | PRN
  Administered 2015-09-26 – 2015-09-27 (×2): 17 g via ORAL
  Filled 2015-09-25 (×2): qty 1

## 2015-09-25 MED ORDER — METHOCARBAMOL 500 MG PO TABS: 500.0000 mg | ORAL_TABLET | Freq: Four times a day (QID) | ORAL | Status: DC | PRN

## 2015-09-25 MED ORDER — NEOMYCIN-POLYMYXIN B GU 40-200000 IR SOLN
Status: DC | PRN
  Administered 2015-09-25: 700 mL

## 2015-09-25 MED ORDER — ACETAMINOPHEN 650 MG RE SUPP: 650.0000 mg | Freq: Four times a day (QID) | RECTAL | Status: DC | PRN

## 2015-09-25 MED ORDER — FENTANYL CITRATE (PF) 100 MCG/2ML IJ SOLN: 25.0000 ug | INTRAMUSCULAR | Status: DC | PRN

## 2015-09-25 MED ORDER — NEOMYCIN-POLYMYXIN B GU 40-200000 IR SOLN
Status: AC
  Filled 2015-09-25: qty 4

## 2015-09-25 MED ORDER — HYDROCODONE-ACETAMINOPHEN 5-325 MG PO TABS
1.0000 | ORAL_TABLET | Freq: Four times a day (QID) | ORAL | Status: DC | PRN
  Administered 2015-09-25: 2 via ORAL
  Administered 2015-09-26: 1 via ORAL
  Administered 2015-09-26: 2 via ORAL
  Administered 2015-09-26 – 2015-09-28 (×8): 1 via ORAL
  Filled 2015-09-25 (×4): qty 1
  Filled 2015-09-25: qty 2
  Filled 2015-09-25 (×7): qty 1

## 2015-09-25 MED ORDER — WARFARIN - PHARMACIST DOSING INPATIENT
Freq: Every day | Status: DC
  Administered 2015-09-26: 18:00:00

## 2015-09-25 MED ORDER — SODIUM CHLORIDE 0.9 % IV SOLN
INTRAVENOUS | Status: DC
  Administered 2015-09-25: 09:00:00 via INTRAVENOUS

## 2015-09-25 MED ORDER — SODIUM CHLORIDE 0.9 % IV SOLN: INTRAVENOUS | Status: DC | PRN

## 2015-09-25 MED ORDER — ACETAMINOPHEN 325 MG PO TABS
650.0000 mg | ORAL_TABLET | Freq: Four times a day (QID) | ORAL | Status: DC | PRN
  Administered 2015-09-26 (×3): 325 mg via ORAL
  Filled 2015-09-25 (×3): qty 1

## 2015-09-25 MED ORDER — DEXTROSE 50 % IV SOLN
INTRAVENOUS | Status: AC
  Administered 2015-09-25: 25 mL
  Filled 2015-09-25: qty 50

## 2015-09-25 MED ORDER — METHOCARBAMOL 1000 MG/10ML IJ SOLN
500.0000 mg | Freq: Four times a day (QID) | INTRAVENOUS | Status: DC | PRN
  Filled 2015-09-25: qty 5

## 2015-09-25 MED ORDER — MIDAZOLAM HCL 5 MG/5ML IJ SOLN
INTRAMUSCULAR | Status: DC | PRN
  Administered 2015-09-25: 2 mg via INTRAVENOUS

## 2015-09-25 MED ORDER — LACTATED RINGERS IV SOLN
INTRAVENOUS | Status: DC | PRN
  Administered 2015-09-25 (×2): via INTRAVENOUS

## 2015-09-25 MED ORDER — PHENYLEPHRINE HCL 10 MG/ML IJ SOLN
INTRAMUSCULAR | Status: DC | PRN
  Administered 2015-09-25 (×10): 200 ug via INTRAVENOUS

## 2015-09-25 MED ORDER — OXYCODONE HCL 5 MG PO TABS: 10.0000 mg | ORAL_TABLET | ORAL | Status: DC | PRN

## 2015-09-25 MED ORDER — ONDANSETRON HCL 4 MG/2ML IJ SOLN: 4.0000 mg | Freq: Four times a day (QID) | INTRAMUSCULAR | Status: DC | PRN

## 2015-09-25 MED ORDER — ONDANSETRON HCL 4 MG/2ML IJ SOLN: 4.0000 mg | Freq: Once | INTRAMUSCULAR | Status: DC | PRN

## 2015-09-25 MED ORDER — SODIUM CHLORIDE 0.9 % IV SOLN
75.0000 mL/h | INTRAVENOUS | Status: DC
  Administered 2015-09-25 – 2015-09-26 (×3): 75 mL/h via INTRAVENOUS

## 2015-09-25 MED ORDER — ONDANSETRON HCL 4 MG PO TABS: 4.0000 mg | ORAL_TABLET | Freq: Four times a day (QID) | ORAL | Status: DC | PRN

## 2015-09-25 MED ORDER — SENNA 8.6 MG PO TABS
1.0000 | ORAL_TABLET | Freq: Two times a day (BID) | ORAL | Status: DC
  Administered 2015-09-25 – 2015-09-28 (×6): 8.6 mg via ORAL
  Filled 2015-09-25 (×6): qty 1

## 2015-09-25 MED ORDER — BISACODYL 10 MG RE SUPP
10.0000 mg | Freq: Every day | RECTAL | Status: DC | PRN
  Filled 2015-09-25: qty 1

## 2015-09-25 MED ORDER — FERROUS SULFATE 325 (65 FE) MG PO TABS
325.0000 mg | ORAL_TABLET | Freq: Three times a day (TID) | ORAL | Status: DC
  Administered 2015-09-26 – 2015-09-28 (×5): 325 mg via ORAL
  Filled 2015-09-25 (×6): qty 1

## 2015-09-25 MED ORDER — KETAMINE HCL 50 MG/ML IJ SOLN
INTRAMUSCULAR | Status: DC | PRN
  Administered 2015-09-25 (×2): 50 mg via INTRAMUSCULAR

## 2015-09-25 MED ORDER — MENTHOL 3 MG MT LOZG
1.0000 | LOZENGE | OROMUCOSAL | Status: DC | PRN
  Filled 2015-09-25: qty 9

## 2015-09-25 MED ORDER — ALUM & MAG HYDROXIDE-SIMETH 200-200-20 MG/5ML PO SUSP: 30.0000 mL | ORAL | Status: DC | PRN

## 2015-09-25 MED ORDER — PHENOL 1.4 % MT LIQD
1.0000 | OROMUCOSAL | Status: DC | PRN
Start: 2015-09-25 — End: 2015-09-28
  Filled 2015-09-25: qty 177

## 2015-09-25 SURGICAL SUPPLY — 35 items
BIT DRILL CANN LG 4.3MM (BIT) ×1 IMPLANT
BNDG COHESIVE 6X5 TAN STRL LF (GAUZE/BANDAGES/DRESSINGS) ×6 IMPLANT
CANISTER SUCT 1200ML W/VALVE (MISCELLANEOUS) ×3 IMPLANT
DRAPE SHEET LG 3/4 BI-LAMINATE (DRAPES) ×6 IMPLANT
DRAPE SURG 17X11 SM STRL (DRAPES) ×6 IMPLANT
DRAPE U-SHAPE 47X51 STRL (DRAPES) ×3 IMPLANT
DRILL BIT CANN LG 4.3MM (BIT) ×3
DRSG OPSITE POSTOP 4X10 (GAUZE/BANDAGES/DRESSINGS) ×3 IMPLANT
DRSG OPSITE POSTOP 4X14 (GAUZE/BANDAGES/DRESSINGS) ×3 IMPLANT
DURAPREP 26ML APPLICATOR (WOUND CARE) ×6 IMPLANT
ELECT REM PT RETURN 9FT ADLT (ELECTROSURGICAL) ×3
ELECTRODE REM PT RTRN 9FT ADLT (ELECTROSURGICAL) ×1 IMPLANT
GLOVE BIOGEL PI IND STRL 9 (GLOVE) ×1 IMPLANT
GLOVE BIOGEL PI INDICATOR 9 (GLOVE) ×2
GLOVE SURG 9.0 ORTHO LTXF (GLOVE) ×6 IMPLANT
GOWN STRL REUS TWL 2XL XL LVL4 (GOWN DISPOSABLE) ×3 IMPLANT
GOWN STRL REUS W/ TWL LRG LVL3 (GOWN DISPOSABLE) ×1 IMPLANT
GOWN STRL REUS W/TWL LRG LVL3 (GOWN DISPOSABLE) ×2
GUIDEPIN VERSANAIL DSP 3.2X444 ×3 IMPLANT
HEMOVAC 400CC 10FR (MISCELLANEOUS) IMPLANT
HFN 125 DEG 11MM X 180MM (Orthopedic Implant) ×3 IMPLANT
KIT RM TURNOVER CYSTO AR (KITS) ×3 IMPLANT
MAT BLUE FLOOR 46X72 FLO (MISCELLANEOUS) ×3 IMPLANT
NS IRRIG 1000ML POUR BTL (IV SOLUTION) ×3 IMPLANT
PACK HIP COMPR (MISCELLANEOUS) ×3 IMPLANT
SCREW BONE CORTICAL 5.0X38 (Screw) ×3 IMPLANT
SCREW LAG 10.5MMX105MM HFN (Screw) ×3 IMPLANT
STAPLER SKIN PROX 35W (STAPLE) ×3 IMPLANT
SUCTION FRAZIER HANDLE 10FR (MISCELLANEOUS) ×2
SUCTION TUBE FRAZIER 10FR DISP (MISCELLANEOUS) ×1 IMPLANT
SUT VIC AB 0 CT1 36 (SUTURE) ×6 IMPLANT
SUT VIC AB 2-0 CT1 27 (SUTURE) ×2
SUT VIC AB 2-0 CT1 TAPERPNT 27 (SUTURE) ×1 IMPLANT
SUT VICRYL 0 AB UR-6 (SUTURE) ×3 IMPLANT
SYR 30ML LL (SYRINGE) ×3 IMPLANT

## 2015-09-25 NOTE — Progress Notes (Signed)
Subjective:  She was seen in the preoperative area. She was surrounded by family and friends. Patient is ready to proceed with surgery. She has no complaints.  Objective:   VITALS:   Vitals:   09/25/15 0052 09/25/15 0620 09/25/15 0720 09/25/15 1121  BP: 109/65 (!) 120/50 126/66 120/68  Pulse: 60 75 99   Resp: 18 18 18    Temp: 98.5 F (36.9 C) 98.7 F (37.1 C) 99.3 F (37.4 C)   TempSrc: Oral Oral Oral   SpO2: 96% 91% 94%   Weight:      Height:        PHYSICAL EXAM:  Right lower extremity: Patient remains neurovascular intact. Her skin is intact. There is no significant swelling. Her thigh compartments are soft and compressible. There is no erythema seen. Patient has shortening and external rotation the right lower extremity.   LABS  Results for orders placed or performed during the hospital encounter of 09/23/15 (from the past 24 hour(s))  Glucose, capillary     Status: Abnormal   Collection Time: 09/24/15  3:29 PM  Result Value Ref Range   Glucose-Capillary 124 (H) 65 - 99 mg/dL  Glucose, capillary     Status: Abnormal   Collection Time: 09/24/15  4:42 PM  Result Value Ref Range   Glucose-Capillary 133 (H) 65 - 99 mg/dL   Comment 1 Notify RN   Protime-INR     Status: None   Collection Time: 09/24/15 10:14 PM  Result Value Ref Range   Prothrombin Time 14.7 11.4 - 15.2 seconds   INR 1.14   Glucose, capillary     Status: Abnormal   Collection Time: 09/24/15 10:17 PM  Result Value Ref Range   Glucose-Capillary 121 (H) 65 - 99 mg/dL  Glucose, capillary     Status: Abnormal   Collection Time: 09/25/15 12:49 AM  Result Value Ref Range   Glucose-Capillary 120 (H) 65 - 99 mg/dL  Protime-INR     Status: None   Collection Time: 09/25/15  3:49 AM  Result Value Ref Range   Prothrombin Time 14.6 11.4 - 15.2 seconds   INR 1.13   CBC     Status: Abnormal   Collection Time: 09/25/15  3:49 AM  Result Value Ref Range   WBC 13.3 (H) 3.6 - 11.0 K/uL   RBC 3.54 (L) 3.80 -  5.20 MIL/uL   Hemoglobin 10.9 (L) 12.0 - 16.0 g/dL   HCT 82.9 (L) 56.2 - 13.0 %   MCV 92.3 80.0 - 100.0 fL   MCH 30.8 26.0 - 34.0 pg   MCHC 33.3 32.0 - 36.0 g/dL   RDW 86.5 (H) 78.4 - 69.6 %   Platelets 267 150 - 440 K/uL  Basic metabolic panel     Status: Abnormal   Collection Time: 09/25/15  3:49 AM  Result Value Ref Range   Sodium 131 (L) 135 - 145 mmol/L   Potassium 4.1 3.5 - 5.1 mmol/L   Chloride 96 (L) 101 - 111 mmol/L   CO2 29 22 - 32 mmol/L   Glucose, Bld 117 (H) 65 - 99 mg/dL   BUN 8 6 - 20 mg/dL   Creatinine, Ser 2.95 0.44 - 1.00 mg/dL   Calcium 8.5 (L) 8.9 - 10.3 mg/dL   GFR calc non Af Amer >60 >60 mL/min   GFR calc Af Amer >60 >60 mL/min   Anion gap 6 5 - 15  Magnesium     Status: None   Collection Time: 09/25/15  3:49  AM  Result Value Ref Range   Magnesium 1.8 1.7 - 2.4 mg/dL  Glucose, capillary     Status: None   Collection Time: 09/25/15  6:17 AM  Result Value Ref Range   Glucose-Capillary 98 65 - 99 mg/dL   Comment 1 Notify RN   Glucose, capillary     Status: Abnormal   Collection Time: 09/25/15 11:34 AM  Result Value Ref Range   Glucose-Capillary 106 (H) 65 - 99 mg/dL   Comment 1 Notify RN     Dg Chest 1 View  Result Date: 09/23/2015 CLINICAL DATA:  Fall at home, RIGHT hip pain EXAM: CHEST 1 VIEW COMPARISON:  12/28 2006 FINDINGS: Heart is enlarged. Chronic bronchitic markings noted. No effusion, infiltrate pneumothorax. IMPRESSION: Cardiomegaly with chronic bronchitic change.  No acute findings. Electronically Signed   By: Genevive Bi M.D.   On: 09/23/2015 22:00   Ct Head Wo Contrast  Result Date: 09/23/2015 CLINICAL DATA:  Pt. States she slipped on rug on stairs at home today. Pt.states she landed on rt. Hip. Pt. States she was able to get up and ambulate to kitchen. Pt. States she heard "pop" in rt. Hip and went down in kitchen. Pt. States she.*comment was truncated* EXAM: CT HEAD WITHOUT CONTRAST TECHNIQUE: Contiguous axial images were obtained  from the base of the skull through the vertex without intravenous contrast. COMPARISON:  None. FINDINGS: Brain: No intracranial hemorrhage. No parenchymal contusion. No midline shift or mass effect. Basilar cisterns are patent. No skull base fracture. No fluid in the paranasal sinuses or mastoid air cells. Orbits are normal. There are periventricular and subcortical white matter hypodensities. Generalized cortical atrophy. Vascular: Negative Skull: No fracture Sinuses/Orbits: Paranasal sinuses and mastoid air cells are clear. Orbits are clear. IMPRESSION: No intracranial trauma. Atrophy and white matter microvascular disease. Electronically Signed   By: Genevive Bi M.D.   On: 09/23/2015 22:00   Dg Knee Right Port  Result Date: 09/24/2015 CLINICAL DATA:  Patient fell yesterday. Now with right knee pain and stiffness. EXAM: PORTABLE RIGHT KNEE - 1-2 VIEW COMPARISON:  None. FINDINGS: Loss of joint space noted in the medial compartment. Degenerative spurring visualized in all 3 compartments. Bones are diffusely demineralized. No worrisome lytic or sclerotic osseous abnormality. No evidence for joint effusion. IMPRESSION: Osteopenia with tricompartmental degenerative change. Electronically Signed   By: Kennith Center M.D.   On: 09/24/2015 16:46   Dg Hip Unilat W Or Wo Pelvis 2-3 Views Right  Result Date: 09/23/2015 CLINICAL DATA:  Fall.  Right hip pain EXAM: DG HIP (WITH OR WITHOUT PELVIS) 2-3V RIGHT COMPARISON:  None. FINDINGS: There is a comminuted, predominantly transverse fracture of the right femoral neck, traversing the right greater trochanter. There is mild proximal displacement. No pelvic fracture is identified. Right femoral head remains seated within the acetabular cup. Limited views of the left hip show no acute abnormality. IMPRESSION: Fracture of the right femoral neck extending to the greater trochanter with mild medial displacement. Right femoral head remains approximated at the hip.  Electronically Signed   By: Deatra Robinson M.D.   On: 09/23/2015 22:01    Assessment/Plan: Day of Surgery   Principal Problem:   Closed right hip fracture [facility]) Active Problems:   Diabetes (HCC)   HTN (hypertension)   Hypothyroidism   Atrial fibrillation [facility])  Patient arrived on Coumadin which she takes for atrial fibrillation. She arrived with an INR at 2.5. Today it is 1.1. The plan is to proceed with intramedullary fixation  for right intertrochanteric hip fracture. I described the procedure in detail as well as the postoperative course. I also explained to her the risks and benefits of the surgery. She understands risks include infection, bleeding requiring blood transfusion, nerve or blood vessel injury, malunion, nonunion, leg length discrepancy or change in lower extremity rotation, hardware failure and the need for further surgery. She understands the medical risks include but are not limited to DVT and pulmonary embolism, myocardial infarction, stroke, pneumonia, respiratory failure and death. Patient understood these risks and wished to proceed.    Juanell FairlyKRASINSKI, Jasmain Ahlberg , MD 09/25/2015, 1:53 PM

## 2015-09-25 NOTE — Consult Note (Signed)
ANTICOAGULATION CONSULT NOTE - Initial Consult  Pharmacy Consult for warfarin Indication: atrial fibrillation and VTE prophylaxis  No Active Allergies  Patient Measurements: Height: 5\' 6"  (167.6 cm) Weight: 172 lb 14.4 oz (78.4 kg) IBW/kg (Calculated) : 59.3 Heparin Dosing Weight:   Vital Signs: Temp: 98 F (36.7 C) (08/27 1652) Temp Source: Axillary (08/27 1652) BP: 115/71 (08/27 1714) Pulse Rate: 111 (08/27 1714)  Labs:  Recent Labs  09/23/15 2059 09/24/15 0036 09/24/15 2214 09/25/15 0349  HGB 11.5* 11.1*  --  10.9*  HCT 33.8* 33.3*  --  32.7*  PLT 294 283  --  267  APTT  --  40*  --   --   LABPROT 28.1* 28.3* 14.7 14.6  INR 2.57 2.59 1.14 1.13  CREATININE 0.55 0.47  --  0.68    Estimated Creatinine Clearance: 51.3 mL/min (by C-G formula based on SCr of 0.8 mg/dL).   Medical History: Past Medical History:  Diagnosis Date  . Arthritis    knees  . Atrial fibrillation (HCC)   . Diabetes mellitus without complication (HCC)   . Hypercholesteremia   . Hypertension   . Hypothyroidism   . Thyroid disease   . Wears dentures    partial lower    Medications:  Scheduled:  . atorvastatin  20 mg Oral Daily  .  ceFAZolin (ANCEF) IV  2 g Intravenous Q6H  . diltiazem  240 mg Oral Daily  . docusate sodium  100 mg Oral BID  . ferrous sulfate  325 mg Oral TID PC  . insulin aspart  0-9 Units Subcutaneous Q6H  . latanoprost  1 drop Both Eyes QHS  . levothyroxine  125 mcg Oral QAC breakfast  . losartan  100 mg Oral Daily  . senna  1 tablet Oral BID  . sodium chloride flush  3 mL Intravenous Q12H  . timolol  1 drop Both Eyes Daily  . warfarin  4 mg Oral ONCE-1800  . Warfarin - Pharmacist Dosing Inpatient   Does not apply q1800    Assessment: Pt is a 82 year old female with a PMH of afib s/p hip surgery. Pt is one warfarin at home. Home dose is 2.5mg  daily. Patient received 2 doses of IV vit K prior to surgery. Her INR this AM was 1.13.   Goal of Therapy:  INR  2-3 Monitor platelets by anticoagulation protocol: Yes   Plan:  Will give 5mg  tonight. Spoke with Dr. Martha ClanKrasinski about starting bridge therapy tomorrow. Pt received a decent amount of vit K and will most likely take several days to become therapeutic. Per Dr.will see what INR in tomorrow. If it hasn't budged, he will talk with IM about staring bridge therapy. Recheck INR in the AM.  Efraim KaufmannMelissa D Shyheem Whitham 09/25/2015,5:24 PM

## 2015-09-25 NOTE — Anesthesia Preprocedure Evaluation (Addendum)
Anesthesia Evaluation  Patient identified by MRN, date of birth, ID band Patient awake    Reviewed: Allergy & Precautions, H&P , NPO status , Patient's Chart, lab work & pertinent test results  Airway Mallampati: II  TM Distance: >3 FB     Dental no notable dental hx. (+) Caps   Pulmonary neg pulmonary ROS,    Pulmonary exam normal        Cardiovascular hypertension, Pt. on medications + dysrhythmias Atrial Fibrillation  Rhythm:Irregular     Neuro/Psych negative neurological ROS  negative psych ROS   GI/Hepatic negative GI ROS, Neg liver ROS,   Endo/Other  diabetes, Well Controlled, Type 2, Oral Hypoglycemic AgentsHypothyroidism   Renal/GU negative Renal ROS  negative genitourinary   Musculoskeletal  (+) Arthritis , Fx Hip   Abdominal Normal abdominal exam  (+)   Peds negative pediatric ROS (+)  Hematology negative hematology ROS (+)   Anesthesia Other Findings   Reproductive/Obstetrics                            Anesthesia Physical  Anesthesia Plan  ASA: III and emergent  Anesthesia Plan: Spinal   Post-op Pain Management:    Induction: Intravenous  Airway Management Planned: Nasal Cannula  Additional Equipment:   Intra-op Plan:   Post-operative Plan:   Informed Consent: I have reviewed the patients History and Physical, chart, labs and discussed the procedure including the risks, benefits and alternatives for the proposed anesthesia with the patient or authorized representative who has indicated his/her understanding and acceptance.   Dental advisory given  Plan Discussed with: CRNA and Surgeon  Anesthesia Plan Comments:        Anesthesia Quick Evaluation

## 2015-09-25 NOTE — Progress Notes (Signed)
Pts HR 128, Anesthesilogist MD Carrol paged, per MD ok to give morning dose of Cardiziem. MD Imogene Burnhen also notified.

## 2015-09-25 NOTE — OR Nursing (Signed)
Unable to determine spinal level at this time

## 2015-09-25 NOTE — Progress Notes (Signed)
Subjective:  POST-OP CHECK:  Status post intramedullary fixation of right hip fracture and aspiration of the right knee. Patient is reported to be somnolent by the nursing staff. Patient is asleep when I am in the room. Current blood pressure is 103/50.  Objective:   VITALS:   Vitals:   09/25/15 1530 09/25/15 1540 09/25/15 1619 09/25/15 1652  BP:    (!) 103/50  Pulse:    (!) 114  Resp:    16  Temp: 98.3 F (36.8 C) 98.3 F (36.8 C) 97.8 F (36.6 C) 98 F (36.7 C)  TempSrc:    Axillary  SpO2:    94%  Weight:      Height:        PHYSICAL EXAM:  Right lower extremity: Patient's hip and knee bandages are clean dry and intact. She has palpable pedal pulses. Given the patient is sleeping she is unable to participate with the exam. I cannot assess motor and sensory function at this time. Patient did undergo spinal anesthetic.  LABS  Results for orders placed or performed during the hospital encounter of 09/23/15 (from the past 24 hour(s))  Protime-INR     Status: None   Collection Time: 09/24/15 10:14 PM  Result Value Ref Range   Prothrombin Time 14.7 11.4 - 15.2 seconds   INR 1.14   Glucose, capillary     Status: Abnormal   Collection Time: 09/24/15 10:17 PM  Result Value Ref Range   Glucose-Capillary 121 (H) 65 - 99 mg/dL  Glucose, capillary     Status: Abnormal   Collection Time: 09/25/15 12:49 AM  Result Value Ref Range   Glucose-Capillary 120 (H) 65 - 99 mg/dL  Protime-INR     Status: None   Collection Time: 09/25/15  3:49 AM  Result Value Ref Range   Prothrombin Time 14.6 11.4 - 15.2 seconds   INR 1.13   CBC     Status: Abnormal   Collection Time: 09/25/15  3:49 AM  Result Value Ref Range   WBC 13.3 (H) 3.6 - 11.0 K/uL   RBC 3.54 (L) 3.80 - 5.20 MIL/uL   Hemoglobin 10.9 (L) 12.0 - 16.0 g/dL   HCT 40.9 (L) 81.1 - 91.4 %   MCV 92.3 80.0 - 100.0 fL   MCH 30.8 26.0 - 34.0 pg   MCHC 33.3 32.0 - 36.0 g/dL   RDW 78.2 (H) 95.6 - 21.3 %   Platelets 267 150 - 440  K/uL  Basic metabolic panel     Status: Abnormal   Collection Time: 09/25/15  3:49 AM  Result Value Ref Range   Sodium 131 (L) 135 - 145 mmol/L   Potassium 4.1 3.5 - 5.1 mmol/L   Chloride 96 (L) 101 - 111 mmol/L   CO2 29 22 - 32 mmol/L   Glucose, Bld 117 (H) 65 - 99 mg/dL   BUN 8 6 - 20 mg/dL   Creatinine, Ser 0.86 0.44 - 1.00 mg/dL   Calcium 8.5 (L) 8.9 - 10.3 mg/dL   GFR calc non Af Amer >60 >60 mL/min   GFR calc Af Amer >60 >60 mL/min   Anion gap 6 5 - 15  Magnesium     Status: None   Collection Time: 09/25/15  3:49 AM  Result Value Ref Range   Magnesium 1.8 1.7 - 2.4 mg/dL  Glucose, capillary     Status: None   Collection Time: 09/25/15  6:17 AM  Result Value Ref Range   Glucose-Capillary 98 65 -  99 mg/dL   Comment 1 Notify RN   Glucose, capillary     Status: Abnormal   Collection Time: 09/25/15 11:34 AM  Result Value Ref Range   Glucose-Capillary 106 (H) 65 - 99 mg/dL   Comment 1 Notify RN     Dg Chest 1 View  Result Date: 09/23/2015 CLINICAL DATA:  Fall at home, RIGHT hip pain EXAM: CHEST 1 VIEW COMPARISON:  12/28 2006 FINDINGS: Heart is enlarged. Chronic bronchitic markings noted. No effusion, infiltrate pneumothorax. IMPRESSION: Cardiomegaly with chronic bronchitic change.  No acute findings. Electronically Signed   By: Genevive Bi M.D.   On: 09/23/2015 22:00   Ct Head Wo Contrast  Result Date: 09/23/2015 CLINICAL DATA:  Pt. States she slipped on rug on stairs at home today. Pt.states she landed on rt. Hip. Pt. States she was able to get up and ambulate to kitchen. Pt. States she heard "pop" in rt. Hip and went down in kitchen. Pt. States she.*comment was truncated* EXAM: CT HEAD WITHOUT CONTRAST TECHNIQUE: Contiguous axial images were obtained from the base of the skull through the vertex without intravenous contrast. COMPARISON:  None. FINDINGS: Brain: No intracranial hemorrhage. No parenchymal contusion. No midline shift or mass effect. Basilar cisterns are  patent. No skull base fracture. No fluid in the paranasal sinuses or mastoid air cells. Orbits are normal. There are periventricular and subcortical white matter hypodensities. Generalized cortical atrophy. Vascular: Negative Skull: No fracture Sinuses/Orbits: Paranasal sinuses and mastoid air cells are clear. Orbits are clear. IMPRESSION: No intracranial trauma. Atrophy and white matter microvascular disease. Electronically Signed   By: Genevive Bi M.D.   On: 09/23/2015 22:00   Dg Knee Right Port  Result Date: 09/24/2015 CLINICAL DATA:  Patient fell yesterday. Now with right knee pain and stiffness. EXAM: PORTABLE RIGHT KNEE - 1-2 VIEW COMPARISON:  None. FINDINGS: Loss of joint space noted in the medial compartment. Degenerative spurring visualized in all 3 compartments. Bones are diffusely demineralized. No worrisome lytic or sclerotic osseous abnormality. No evidence for joint effusion. IMPRESSION: Osteopenia with tricompartmental degenerative change. Electronically Signed   By: Kennith Center M.D.   On: 09/24/2015 16:46   Dg Hip Port Unilat With Pelvis 1v Right  Result Date: 09/25/2015 CLINICAL DATA:  Status post ORIF for right femoral neck fracture. EXAM: DG HIP (WITH OR WITHOUT PELVIS) 1V PORT RIGHT COMPARISON:  09/23/2015. FINDINGS: AP and cross-table lateral view of the right hip shows the patient to be status post dynamic hip screw placement for intertrochanteric femoral neck fracture. Bony alignment is improved compared to the pre reduction film. No evidence for immediate hardware complications. Gas in the soft tissues is compatible with the immediate postoperative state. IMPRESSION: Status post ORIF for intertrochanteric right femoral neck fracture. Electronically Signed   By: Kennith Center M.D.   On: 09/25/2015 16:26   Dg Hip Operative Unilat W Or W/o Pelvis Right  Result Date: 09/25/2015 CLINICAL DATA:  Right hip nailing EXAM: OPERATIVE RIGHT HIP (WITH PELVIS IF PERFORMED) 4 VIEWS  TECHNIQUE: Fluoroscopic spot image(s) were submitted for interpretation post-operatively. COMPARISON:  [id number] foul since 17 FINDINGS: Internal fixation across the right intertrochanteric fracture. Anatomic alignment. No hardware complicating feature. IMPRESSION: Internal fixation.  No complicating feature. Electronically Signed   By: Charlett Nose M.D.   On: 09/25/2015 15:26   Dg Hip Unilat W Or Wo Pelvis 2-3 Views Right  Result Date: 09/23/2015 CLINICAL DATA:  Fall.  Right hip pain EXAM: DG HIP (WITH OR WITHOUT PELVIS) 2-3V  RIGHT COMPARISON:  None. FINDINGS: There is a comminuted, predominantly transverse fracture of the right femoral neck, traversing the right greater trochanter. There is mild proximal displacement. No pelvic fracture is identified. Right femoral head remains seated within the acetabular cup. Limited views of the left hip show no acute abnormality. IMPRESSION: Fracture of the right femoral neck extending to the greater trochanter with mild medial displacement. Right femoral head remains approximated at the hip. Electronically Signed   By: Deatra RobinsonKevin  Herman M.D.   On: 09/23/2015 22:01    Assessment/Plan: Day of Surgery   Principal Problem:   Closed right hip fracture (HCC) Active Problems:   Diabetes (HCC)   HTN (hypertension)   Hypothyroidism   Atrial fibrillation [facility](HCC)  Patient is still somnolent after surgery. She recently got to the floor. She remains hemodynamically stable. Patient's IV fluid rate was increased from 50-75 by the hospitalist. We'll continue to monitor her postoperative progress. Patient will receive 24 hours of postoperative antibiotics. She'll restart her Coumadin tonight. Recheck labs in the morning. Patient will begin physical and occupational therapy tomorrow. She'll be partial weightbearing on the right lower extremity with a walker.    Juanell FairlyKRASINSKI, Amberlin Utke , MD 09/25/2015, 5:12 PM

## 2015-09-25 NOTE — Op Note (Addendum)
DATE OF SURGERY:  09/25/2015  TIME: 4:37 PM  PATIENT NAME:  Yolanda Cantu  AGE: 82 y.o.  PRE-OPERATIVE DIAGNOSIS:  Displaced intertrochanteric right hip fracture, right knee effusion  POST-OPERATIVE DIAGNOSIS:  SAME  PROCEDURE:  INTRAMEDULLARY (IM) NAIL INTERTROCHANTRIC, RIGHT HIP, RIGHT KNEE ASPIRATION IN PACU  SURGEON:  Juanell FairlyKRASINSKI, Aerika Groll  OPERATIVE IMPLANTS: Biomet short Affixus nail 11x180, 105 mm lag screw with a 38 mm distal interlocking screw  PREOPERATIVE INDICATIONS:  Yolanda SportsMary R Casler is a 82 y.o. year old who fell and suffered a hip fracture. She was brought into the ER and then admitted and medically cleared for surgical intervention.    The risks, benefits and alternatives were discussed with the patient and their family.  The risks include but are not limited to infection, bleeding, nerve or blood vessel injury, malunion, nonunion, hardware prominence, hardware failure, change in leg lengths or lower extremity rotation need for further surgery including hardware removal with conversion to a total hip arthroplasty. Medical risks include but are not limited to DVT and pulmonary embolism, myocardial infarction, stroke, pneumonia, respiratory failure and death. The patient and their family understood these risks and wished to proceed with surgery.  OPERATIVE PROCEDURE:  The patient was brought to the operating room and placed in the supine position on the fracture table. Spinal anesthesia was administered.  A closed reduction was performed under C-arm guidance.  The fracture reduction was confirmed on both AP and lateral views. A time out was performed to verify the patient's name, date of birth, medical record number, correct site of surgery correct procedure to be performed. The timeout was also used to verify the patient received antibiotics and all appropriate instruments, implants and radiographic studies were available in the room. Once all in attendance were in agreement, the case  began. The patient was prepped and draped in a sterile fashion. She received 2 grams of ancef for preoperative antibiotics.  An incision was made proximal to the greater trochanter in line with the femur. A guidewire was placed over the tip of the greater trochanter and advanced into the proximal femur to the level of the lesser trochanter.  Confirmation of the drill pin position was made on AP and lateral C-arm images.  The threaded guidepin was then overdrilled with the proximal femoral drill.  The nail was then inserted into the proximal femur, across the fracture site and into the femoral shaft. Its position was confirmed on AP and lateral C-arm images.   Once the nail was completely seated, the drill guide for the lag screw was placed through the guide arm for the Affixus nail. A guidepin was then placed through this drill guide and advanced through the lateral cortex of the femur, across the fracture site and into the femoral head achieving a tip apex distance of less than 25 mm. The length of the drill pin was measured, and then the drill for the lag screw was advanced through the lateral cortex, across the fracture site and up into the femoral head to the depth of the lag screw..  The lag screw was then advanced by hand into position across the fracture site into the femoral head. Its final position was confirmed on AP and lateral C-arm images. Compression was applied as traction was carefully released. The set screw in the top of the intramedullary rod was tightened by hand using a screwdriver. It was backed off a quarter turn to allow for compression at the fracture site.  The drill sleeve  for the distal interlocking screw was then placed through the Affixus guide arm. A small stab incision was made to allow the drill guide to approximate the lateral cortex of the femur. The drill for the distal interlocking screw was then advanced bicortically. The depth of this drill was measured. A distal  interlocking screw with the length measured was then inserted by hand through the guide arm. Final C-arm images of the entire intramedullary construct were taken in both the AP and lateral planes.   The wounds were irrigated copiously and closed with 0 Vicryl for closure of the deep fascia and 2-0 Vicryl for subcutaneous closure. The skin was approximated with staples. A dry sterile dressing was applied. I was scrubbed and present the entire case and all sharp and instrument counts were correct at the conclusion of the case. Patient was transferred to hospital bed and brought to PACU in stable condition.  In the recovery room, the right knee was aspirated as she had a large effusion there. There is no associated erythema or ecchymosis. Patient had been complaining of pain preoperatively in the right knee since her fall.  X-rays of the right knee were negative for fracture. The aspirate from the right knee was sent to the lab to evaluate for crystals, cell count, Gram stain and culture. This right knee aspirate was performed under sterile conditions. The knee had a compressive dressing applied following the aspirate to prevent reaccumulation of the fluid.   I spoke with the patient's family in the postop consultation room to let them know the case had been performed without complication and the patient was stable in the recovery room.    Kathreen Devoid, MD

## 2015-09-25 NOTE — Progress Notes (Signed)
[facility] [place] [facility]   PATIENT NAME: Yolanda Cantu    MR#:  [id number]  DATE OF BIRTH:  1925/03/12  SUBJECTIVE:  CHIEF COMPLAINT:   Chief Complaint  Patient presents with  . Fall    Pt. states mechanical fall in kitchen tonight.  Pt. has rotation of rt. hip and shortning.   Right hip pain. HR up to 118-120 just now. REVIEW OF SYSTEMS:  Review of Systems  Constitutional: Negative for chills, fever and malaise/fatigue.  HENT: Negative for congestion.   Eyes: Negative for blurred vision and double vision.  Respiratory: Negative for cough, shortness of breath and wheezing.   Cardiovascular: Negative for chest pain, palpitations and leg swelling.  Gastrointestinal: Negative for abdominal pain, blood in stool, constipation, diarrhea, melena, nausea and vomiting.  Genitourinary: Negative for dysuria and urgency.  Musculoskeletal: Positive for joint pain.  Neurological: Negative for dizziness, focal weakness and headaches.  Psychiatric/Behavioral: Negative for depression.    DRUG ALLERGIES:  No Active Allergies VITALS:  Blood pressure 120/68, pulse 99, temperature 99.3 F (37.4 C), temperature source Oral, resp. rate 18, height 5\' 6"  (1.676 m), weight 172 lb 14.4 oz (78.4 kg), SpO2 94 %. PHYSICAL EXAMINATION:  Physical Exam  Constitutional: She is oriented to person, place, and time and well-developed, well-nourished, and in no distress. No distress.  HENT:  Head: Normocephalic and atraumatic.  Mouth/Throat: Oropharynx is clear and moist.  Eyes: Conjunctivae and EOM are normal. Pupils are equal, round, and reactive to light.  Neck: Normal range of motion. Neck supple. No JVD present.  Cardiovascular: Normal heart sounds.  Exam reveals no gallop.   No murmur heard. Irregular rythm  Pulmonary/Chest: No respiratory distress. She has no wheezes. She has no rales.  Abdominal: She exhibits no distension. There is no tenderness.  Musculoskeletal: She  exhibits no edema or tenderness.  Neurological: She is alert and oriented to person, place, and time.  Skin: Skin is warm.  Psychiatric: Affect normal.   LABORATORY PANEL:   CBC  Recent Labs Lab 09/25/15 0349  WBC 13.3*  HGB 10.9*  HCT 32.7*  PLT 267   ------------------------------------------------------------------------------------------------------------------ Chemistries   Recent Labs Lab 09/24/15 0036 09/25/15 0349  NA 130* 131*  K 3.4* 4.1  CL 101 96*  CO2 22 29  GLUCOSE 127* 117*  BUN 9 8  CREATININE 0.47 0.68  CALCIUM 8.1* 8.5*  MG 1.6*  --    RADIOLOGY:  Dg Knee Right Port  Result Date: 09/24/2015 CLINICAL DATA:  Patient fell yesterday. Now with right knee pain and stiffness. EXAM: PORTABLE RIGHT KNEE - 1-2 VIEW COMPARISON:  None. FINDINGS: Loss of joint space noted in the medial compartment. Degenerative spurring visualized in all 3 compartments. Bones are diffusely demineralized. No worrisome lytic or sclerotic osseous abnormality. No evidence for joint effusion. IMPRESSION: Osteopenia with tricompartmental degenerative change. Electronically Signed   By: Kennith Center M.D.   On: 09/24/2015 16:46   ASSESSMENT AND PLAN:   Closed right hip fracture. Pain control,  holding the warfarin, INR is 1.13 today,  s/p Vit K X 2. Follow-up Dr. Martha Clan for hip surgery this pm.    Diabetes (HCC) - on sliding scale.    HTN (hypertension) - early stable, continue home meds   Atrial fibrillation with RVR - continue Cardizem, hold warfarin for now as above.    Hypothyroidism - home dose thyroid replacement  Leukocytosis. Due to reaction. Follow-up CBC. Hypomagnesemia. Give IV magnesium and follow-up level. Hypokalemia.  improved with potassium supplement. Hyponatremia, continue normal saline IV and follow-up BMP.  All the records are reviewed and case discussed with Care Management/Social Worker. Management plans discussed with the patient, family and they are in  agreement.  CODE STATUS: Full code  TOTAL TIME TAKING CARE OF THIS PATIENT: 32 minutes.   More than 50% of the time was spent in counseling/coordination of care: YES  POSSIBLE D/C IN 3 DAYS, DEPENDING ON CLINICAL CONDITION.   Shaune Pollackhen, Rico Massar M.D on 09/25/2015 at 11:31 AM  Between 7am to 6pm - Pager - [phone]  After 6pm go to [email or web address] - Social research officer, governmentpassword [email or web address] [email or web address]  [facility] [place] [facility]  Office  [phone]  CC: Primary care physician; Marisue IvanLINTHAVONG, KANHKA, MD  Note: This dictation was prepared with Dragon dictation along with smaller phrase technology. Any transcriptional errors that result from this process are unintentional.

## 2015-09-25 NOTE — Progress Notes (Signed)
Hypoglycemic Event  CBG: 67  Treatment: Dextrose 50% 25 ml   Symptoms:  asymptomatic   Follow-up CBG: Time:1858 CBG Result 115  Possible Reasons for Event: Pt was NPO for surgery, on arrival sugar was 16 Yolanda Cantu     Ibraham Levi, SabinaAna C

## 2015-09-25 NOTE — Progress Notes (Signed)
Prime Doc paged to notify of change in rhythm afib with 1 degree heart block. Sheryle Hailiamond, MD with no new orders at this time. Will cont to monitor

## 2015-09-25 NOTE — Transfer of Care (Signed)
Immediate Anesthesia Transfer of Care Note  Patient: Yolanda Cantu  Procedure(s) Performed: Procedure(s): INTRAMEDULLARY (IM) NAIL INTERTROCHANTRIC (Right)  Patient Location: PACU  Anesthesia Type:Spinal  Level of Consciousness: awake, alert  and oriented  Airway & Oxygen Therapy: Patient Spontanous Breathing and Patient connected to nasal cannula oxygen  Post-op Assessment: Report given to RN and Post -op Vital signs reviewed and stable  Post vital signs: Reviewed and stable  Last Vitals:  Vitals:   09/25/15 0720 09/25/15 1121  BP: 126/66 120/68  Pulse: 99   Resp: 18   Temp: 37.4 C     Last Pain:  Vitals:   09/25/15 1207  TempSrc:   PainSc: 10-Worst pain ever         Complications: No apparent anesthesia complications

## 2015-09-25 NOTE — Anesthesia Procedure Notes (Signed)
Spinal  Patient location during procedure: OR Start time: 09/25/2015 2:04 PM End time: 09/25/2015 2:08 PM Staffing Anesthesiologist: Yves DillARROLL, Lonell Stamos Performed: anesthesiologist  Preanesthetic Checklist Completed: patient identified, site marked, surgical consent, pre-op evaluation, timeout performed, IV checked, risks and benefits discussed and monitors and equipment checked Spinal Block Patient position: sitting Prep: Betadine and site prepped and draped Patient monitoring: heart rate, cardiac monitor, continuous pulse ox and blood pressure Approach: midline Location: L3-4 Injection technique: single-shot Needle Needle type: Quincke  Needle gauge: 25 G Needle length: 9 cm Assessment Sensory level: T8 Additional Notes Time out called.  Patient placed in sitting position.  Back prepped and draped in sterile fashion.  A skin wheal was made in the L3-L4 interspace with 1% Lidocaine plain.  A 25 G Quincke needle was used to perform the spinal with the return of clear, colorless CSF in all 4 quadrants.  14 mg of Marcaine + 1: 200K epi.  The patient tolerated the procedure well.  No blood or paresthesias.

## 2015-09-26 ENCOUNTER — Encounter: Payer: Self-pay | Admitting: Orthopedic Surgery

## 2015-09-26 LAB — BASIC METABOLIC PANEL
Anion gap: 4 — ABNORMAL LOW (ref 5–15)
BUN: 11 mg/dL (ref 6–20)
CALCIUM: 7.9 mg/dL — AB (ref 8.9–10.3)
CO2: 26 mmol/L (ref 22–32)
CREATININE: 0.42 mg/dL — AB (ref 0.44–1.00)
Chloride: 99 mmol/L — ABNORMAL LOW (ref 101–111)
GFR calc non Af Amer: >60 mL/min (ref >60)
GLUCOSE: 131 mg/dL — AB (ref 65–99)
Potassium: 4.1 mmol/L (ref 3.5–5.1)
Sodium: 129 mmol/L — ABNORMAL LOW (ref 135–145)

## 2015-09-26 LAB — GLUCOSE, CAPILLARY
GLUCOSE-CAPILLARY: 110 mg/dL — AB (ref 65–99)
GLUCOSE-CAPILLARY: 114 mg/dL — AB (ref 65–99)
GLUCOSE-CAPILLARY: 119 mg/dL — AB (ref 65–99)
Glucose-Capillary: 112 mg/dL — ABNORMAL HIGH (ref 65–99)

## 2015-09-26 LAB — CBC
HEMATOCRIT: 29.5 % — AB (ref 35.0–47.0)
Hemoglobin: 9.8 g/dL — ABNORMAL LOW (ref 12.0–16.0)
MCH: 30.6 pg (ref 26.0–34.0)
MCHC: 33.2 g/dL (ref 32.0–36.0)
MCV: 92.1 fL (ref 80.0–100.0)
Platelets: 225 10*3/uL (ref 150–440)
RBC: 3.21 MIL/uL — ABNORMAL LOW (ref 3.80–5.20)
RDW: 15.3 % — AB (ref 11.5–14.5)
WBC: 13.9 10*3/uL — ABNORMAL HIGH (ref 3.6–11.0)

## 2015-09-26 LAB — PROTIME-INR
INR: 1.14
Prothrombin Time: 14.7 seconds (ref 11.4–15.2)

## 2015-09-26 LAB — VITAMIN D 25 HYDROXY (VIT D DEFICIENCY, FRACTURES): VIT D 25 HYDROXY: 8 ng/mL — AB (ref 30.0–100.0)

## 2015-09-26 MED ORDER — SODIUM CHLORIDE 0.9 % IV SOLN
INTRAVENOUS | Status: DC
  Administered 2015-09-27: 03:00:00 via INTRAVENOUS

## 2015-09-26 MED ORDER — WARFARIN SODIUM 5 MG PO TABS
5.0000 mg | ORAL_TABLET | Freq: Once | ORAL | Status: AC
  Administered 2015-09-26: 5 mg via ORAL
  Filled 2015-09-26: qty 1

## 2015-09-26 MED ORDER — ENOXAPARIN SODIUM 40 MG/0.4ML ~~LOC~~ SOLN
40.0000 mg | SUBCUTANEOUS | Status: DC
  Administered 2015-09-26 – 2015-09-27 (×2): 40 mg via SUBCUTANEOUS
  Filled 2015-09-26 (×2): qty 0.4

## 2015-09-26 NOTE — Evaluation (Signed)
Occupational Therapy Evaluation Patient Details Name: Yolanda Cantu MRN: [id number] DOB: 10/27/1925 Today's Date: 09/26/2015    History of Present Illness Pt is an 82 y.o. female s/p fall slipping on steps and then fell in kitchen landing on R hip.  Pt sustained a R femoral neck fx and R large knee effusion.  Pt s/p R intertrochanteric IMN 8/27 and s/p R knee aspiration in PACU post-op.  PMH significant for a-fib on Coumadin, DM, htn, L TKA 2003, lower dentures, R distal radius fx and ulnar styloid fx November 2016.   Clinical Impression   Pt is 82 year old female s/p R intertrochanteric IMN on 8/27 who lives at home alone. She is partial WB on RLE and on 2L nasal cannula. Pt was independent in all ADLs prior to surgery and is eager to return to PLOF.  Pt is currently limited in functional ADLs due to pain, decreased ROM, and weakness.  Pt requires maximum assist for LB dressing and bathing skills due to pain and decreased AROM of R LE and would benefit from continued skilled OT services for education in assistive devices, functional mobility, and education in recommendations for home modifications to increase safety and prevent falls.  Pt is a good candidate for SNF to continue rehabilitation.       Follow Up Recommendations  SNF    Equipment Recommendations  Tub/shower bench (reacher and sock aid)    Recommendations for Other Services       Precautions / Restrictions Precautions Precautions: Fall Precaution Comments: 2L nasal cannula Restrictions Weight Bearing Restrictions: Yes RLE Weight Bearing: Partial weight bearing RLE Partial Weight Bearing Percentage or Pounds: PWB R LE (50%) with walker      Mobility Bed Mobility  Transfers            Balance                                  ADL Overall ADL's : Needs assistance/impaired Eating/Feeding: Independent;Set up   Grooming: Wash/dry hands;Wash/dry face;Oral care;Applying deodorant;Brushing  hair;Independent;Set up   Upper Body Bathing: Independent;Set up   Lower Body Bathing: Total assistance;Set up   Upper Body Dressing : Independent;Set up   Lower Body Dressing: Maximal assistance;+2 for physical assistance Lower Body Dressing Details (indicate cue type and reason): Pt is very weak and tried hard but has limited active movement for mobility for ADLs and needs 2 people. PT reports pt with difficulty clearing bottom off of bed when trying to stand.                General ADL Comments: Pt currently requires heavy assist of 2 people for bed mobility and LB dressing and bathing skills.  She tried really hard but has limited strength.  She is currently on 2L nasal cannula and is does not have any AD for dressing at home.  She thinks she may have used a reacher and sock aid when she had knee surgery but is not sure.       Vision     Perception     Praxis      Pertinent Vitals/Pain Pain Assessment: 0-10 Pain Score: 4  Pain Location: R hip and knee Pain Descriptors / Indicators: Tender;Throbbing Pain Intervention(s): Limited activity within patient's tolerance;Monitored during session;Premedicated before session;Repositioned     Hand Dominance Right   Extremity/Trunk Assessment Upper Extremity Assessment Upper Extremity Assessment: Generalized weakness   Lower  Extremity Assessment Lower Extremity Assessment: Defer to PT evaluation RLE Deficits / Details: R hip flexion at least 2/5; knee flexion/extension at least 3-/5; DF at least 4/5 RLE: Unable to fully assess due to pain LLE Deficits / Details: 4-/5 L hip flexion, knee flexion/extension, and DF       Communication Communication Communication: No difficulties   Cognition Arousal/Alertness: Awake/alert Behavior During Therapy: WFL for tasks assessed/performed Overall Cognitive Status: Within Functional Limits for tasks assessed                     General Comments       Exercises        Shoulder Instructions      Home Living Family/patient expects to be discharged to:: Private residence Living Arrangements: Alone Available Help at Discharge: Friend(s) Type of Home: Mobile home Home Access: Stairs to enter [employer] of Steps: 4 platform steps Entrance Stairs-Rails: Right Home Layout: One level     Bathroom Shower/Tub: Tub/shower unit Shower/tub characteristics: Curtain Firefighter: Standard     Home Equipment: Environmental consultant - 2 wheels          Prior Functioning/Environment Level of Independence: Independent        Comments: Prior to admission pt was independent without AD; denies any other falls (except recent fall).    OT Diagnosis: Generalized weakness;Acute pain   OT Problem List: Decreased strength;Decreased range of motion;Impaired balance (sitting and/or standing);Decreased activity tolerance;Pain;Decreased knowledge of use of DME or AE   OT Treatment/Interventions: Self-care/ADL training;Therapeutic activities;Patient/family education;DME and/or AE instruction    OT Goals(Current goals can be found in the care plan section) Acute Rehab OT Goals Patient Stated Goal: "to get my strength back" OT Goal Formulation: With patient Time For Goal Achievement: 10/10/15 Potential to Achieve Goals: Good ADL Goals Pt Will Perform Lower Body Dressing: with set-up;with mod assist;with adaptive equipment;sit to/from stand (sitting in chair with no LOB)  OT Frequency: Min 1X/week   Barriers to D/C:            Co-evaluation              End of Session    Activity Tolerance: Patient limited by fatigue;Patient limited by pain Patient left: in bed;with call bell/phone within reach;with bed alarm set   Time: 1145-1210 OT Time Calculation (min): 25 min Charges:  OT General Charges $OT Visit: 1 Procedure OT Evaluation $OT Eval Moderate Complexity: 1 Procedure OT Treatments $Self Care/Home Management : 8-22 mins G-Codes:     Susanne Borders, OTR/L ascom [phone] 09/26/15, 12:41 PM

## 2015-09-26 NOTE — Progress Notes (Signed)
[facility] [place] [facility]   PATIENT NAME: Yolanda Cantu    MR#:  [id number]  DATE OF BIRTH:  July 11, 1925  SUBJECTIVE:  CHIEF COMPLAINT:   Chief Complaint  Patient presents with  . Fall    Pt. states mechanical fall in kitchen tonight.  Pt. has rotation of rt. hip and shortning.   No complaint. HR 111. S/p right hip surgery yesterday. REVIEW OF SYSTEMS:  Review of Systems  Constitutional: Negative for chills, fever and malaise/fatigue.  HENT: Negative for congestion.   Eyes: Negative for blurred vision and double vision.  Respiratory: Negative for cough, shortness of breath and wheezing.   Cardiovascular: Negative for chest pain, palpitations and leg swelling.  Gastrointestinal: Negative for abdominal pain, blood in stool, constipation, diarrhea, melena, nausea and vomiting.  Genitourinary: Negative for dysuria and urgency.  Musculoskeletal: Negative for joint pain.  Neurological: Negative for dizziness, focal weakness and headaches.  Psychiatric/Behavioral: Negative for depression.    DRUG ALLERGIES:  No Active Allergies VITALS:  Blood pressure 104/60, pulse (!) 111, temperature 97.3 F (36.3 C), resp. rate 16, height 5\' 6"  (1.676 m), weight 172 lb 14.4 oz (78.4 kg), SpO2 95 %. PHYSICAL EXAMINATION:  Physical Exam  Constitutional: She is oriented to person, place, and time and well-developed, well-nourished, and in no distress. No distress.  HENT:  Head: Normocephalic and atraumatic.  Mouth/Throat: Oropharynx is clear and moist.  Eyes: Conjunctivae and EOM are normal. Pupils are equal, round, and reactive to light.  Neck: Normal range of motion. Neck supple. No JVD present.  Cardiovascular: Normal heart sounds.  Exam reveals no gallop.   No murmur heard. Irregular rythm  Pulmonary/Chest: No respiratory distress. She has no wheezes. She has no rales.  Abdominal: She exhibits no distension. There is no tenderness.  Musculoskeletal: She exhibits no  edema or tenderness.  Neurological: She is alert and oriented to person, place, and time.  Skin: Skin is warm.  Psychiatric: Affect normal.   LABORATORY PANEL:   CBC  Recent Labs Lab 09/26/15 0439  WBC 13.9*  HGB 9.8*  HCT 29.5*  PLT 225   ------------------------------------------------------------------------------------------------------------------ Chemistries   Recent Labs Lab 09/25/15 0349 09/26/15 0439  NA 131* 129*  K 4.1 4.1  CL 96* 99*  CO2 29 26  GLUCOSE 117* 131*  BUN 8 11  CREATININE 0.68 0.42*  CALCIUM 8.5* 7.9*  MG 1.8  --    RADIOLOGY:  Dg Hip Port Unilat With Pelvis 1v Right  Result Date: 09/25/2015 CLINICAL DATA:  Status post ORIF for right femoral neck fracture. EXAM: DG HIP (WITH OR WITHOUT PELVIS) 1V PORT RIGHT COMPARISON:  09/23/2015. FINDINGS: AP and cross-table lateral view of the right hip shows the patient to be status post dynamic hip screw placement for intertrochanteric femoral neck fracture. Bony alignment is improved compared to the pre reduction film. No evidence for immediate hardware complications. Gas in the soft tissues is compatible with the immediate postoperative state. IMPRESSION: Status post ORIF for intertrochanteric right femoral neck fracture. Electronically Signed   By: Kennith Center M.D.   On: 09/25/2015 16:26   Dg Hip Operative Unilat W Or W/o Pelvis Right  Result Date: 09/25/2015 CLINICAL DATA:  Right hip nailing EXAM: OPERATIVE RIGHT HIP (WITH PELVIS IF PERFORMED) 4 VIEWS TECHNIQUE: Fluoroscopic spot image(s) were submitted for interpretation post-operatively. COMPARISON:  [id number] foul since 17 FINDINGS: Internal fixation across the right intertrochanteric fracture. Anatomic alignment. No hardware complicating feature. IMPRESSION: Internal fixation.  No complicating  feature. Electronically Signed   By: Charlett NoseKevin  Dover M.D.   On: 09/25/2015 15:26   ASSESSMENT AND PLAN:   Closed right hip fracture. POD1. Pain control,  Warfarin  was hold for surger, she was given Vit K X 2. Resume coumadin, f/u INR.    Diabetes (HCC) - on sliding scale.    HTN (hypertension).  Continue Cardizem but hold losartan due to low side blood pressure.    Atrial fibrillation with RVR - continue Cardizem, resume warfarin.    Hypothyroidism - home dose thyroid replacement  Leukocytosis. Due to reaction. Follow-up CBC. Hypomagnesemia. Give IV magnesium and follow-up level. Hypokalemia. improved with potassium supplement. Hyponatremia, continue normal saline IV and follow-up BMP.  Anemia, possible due to hip fracture and surgery. Follow-up CBC.  All the records are reviewed and case discussed with Care Management/Social Worker. Management plans discussed with the patient, family and they are in agreement.  CODE STATUS: Full code  TOTAL TIME TAKING CARE OF THIS PATIENT: 35 minutes.   More than 50% of the time was spent in counseling/coordination of care: YES  POSSIBLE D/C IN 2 DAYS, DEPENDING ON CLINICAL CONDITION.   Shaune Pollackhen, Derrika Ruffalo M.D on 09/26/2015 at 3:07 PM  Between 7am to 6pm - Pager - [phone]  After 6pm go to [email or web address] - Social research officer, governmentpassword [email or web address] [email or web address]  [facility]  Office  [phone]  CC: Primary care physician; Marisue IvanLINTHAVONG, KANHKA, MD  Note: This dictation was prepared with Dragon dictation along with smaller phrase technology. Any transcriptional errors that result from this process are unintentional.

## 2015-09-26 NOTE — Anesthesia Postprocedure Evaluation (Signed)
Anesthesia Post Note  Patient: Yolanda Cantu  Procedure(s) Performed: Procedure(s) (LRB): INTRAMEDULLARY (IM) NAIL INTERTROCHANTRIC (Right)  Patient location during evaluation: Nursing Unit Anesthesia Type: Spinal Level of consciousness: awake and alert and oriented Pain management: satisfactory to patient Vital Signs Assessment: post-procedure vital signs reviewed and stable Respiratory status: respiratory function stable Cardiovascular status: stable Postop Assessment: no headache, no backache, spinal receding, no signs of nausea or vomiting, adequate PO intake and patient able to bend at knees Anesthetic complications: no    Last Vitals:  Vitals:   09/25/15 2305 09/26/15 0435  BP: 125/72 (!) 98/57  Pulse: (!) 112 (!) 110  Resp: 20 18  Temp: 36.8 C 36.4 C    Last Pain:  Vitals:   09/26/15 0637  TempSrc:   PainSc: 6                  Clydene PughBeane, Shaquelle Hernon D

## 2015-09-26 NOTE — Progress Notes (Signed)
Clinical Social Worker (CSW) met with patient and her friend at bedside. CSW presented bed offers to them. Per patient she will review offers with her friend and get back to CSW.   Bailey Sample, LCSW [phone] 

## 2015-09-26 NOTE — Clinical Social Work Placement (Signed)
   CLINICAL SOCIAL WORK PLACEMENT  NOTE  Date:  09/26/2015  Patient Details  Name: Yolanda SportsMary R Schifano MRN: [id number] Date of Birth: 06/02/1925  Clinical Social Work is seeking post-discharge placement for this patient at the Skilled  Nursing Facility level of care (*CSW will initial, date and re-position this form in  chart as items are completed):  Yes   Patient/family provided with [facility] Clinical Social Work Department's list of facilities offering this level of care within the geographic area requested by the patient (or if unable, by the patient's family).  Yes   Patient/family informed of their freedom to choose among providers that offer the needed level of care, that participate in Medicare, Medicaid or managed care program needed by the patient, have an available bed and are willing to accept the patient.  Yes   Patient/family informed of [facility]'s ownership interest in [facility] and [facility], as well as of the fact that they are under no obligation to receive care at these facilities.  PASRR submitted to EDS on 09/26/15     PASRR number received on 09/26/15     Existing PASRR number confirmed on       FL2 transmitted to all facilities in geographic area requested by pt/family on 09/26/15     FL2 transmitted to all facilities within larger geographic area on       Patient informed that his/her managed care company has contracts with or will negotiate with certain facilities, including the following:            Patient/family informed of bed offers received.  Patient chooses bed at       Physician recommends and patient chooses bed at      Patient to be transferred to   on  .  Patient to be transferred to facility by       Patient family notified on   of transfer.  Name of family member notified:        PHYSICIAN       Additional Comment:    _______________________________________________ Aeriel Boulay, Darleen CrockerBailey M, LCSW 09/26/2015, 12:07 PM

## 2015-09-26 NOTE — Progress Notes (Signed)
Pt foley dc this am at 1428 pt bladder scanned for 81 ml. At 1640 bladder scanned for 162 ml. Pt has no complaints of abd pain abd is soft. Made Dr Imogene Burnhen aware orders received.

## 2015-09-26 NOTE — Discharge Instructions (Addendum)
Patient will be PARTIAL WEIGHT BEARING on the right lower extremity with the assistance of a walker. Patient should continue to receive physical therapy for hip and lower extremity range of motion, strengthening and gait training. She should elevate the left lower extremity whenever possible. She may apply ice to the surgical site to help reduce swelling. Dressing should be changed when necessary basis if drainage is observed. Continue DVT prophylaxis for 4-6 weeks postop. Patient will follow with orthopedic surgeon in his office in 10-14 days. Staples will be removed in the orthopedic office.  [facility], Dr. Martha ClanKrasinski, [phone]  Heart healthy and ADA diet. Fall precaution. Follow up INR.

## 2015-09-26 NOTE — NC FL2 (Signed)
[place] MEDICAID FL2 LEVEL OF CARE SCREENING TOOL     IDENTIFICATION  Patient Name: Yolanda Cantu Birthdate: 1925-10-08 Sex: female Admission Date (Current Location): 09/23/2015  [place] and [place] Number:  Chiropodist and Address:  [facility], [address], [place], [place] [address]      Provider Number: [id number]  Attending Physician Name and Address:  Shaune Pollack, MD  Relative Name and Phone Number:       Current Level of Care: Hospital Recommended Level of Care: Skilled Nursing Facility Prior Approval Number:    Date Approved/Denied:   PASRR Number:  [id number])  Discharge Plan: SNF    Current Diagnoses: Patient Active Problem List   Diagnosis Date Noted  . Closed right hip fracture (HCC) 09/23/2015  . Diabetes (HCC) 09/23/2015  . HTN (hypertension) 09/23/2015  . Hypothyroidism 09/23/2015  . Atrial fibrillation (HCC) 09/23/2015    Orientation RESPIRATION BLADDER Height & Weight     Self, Time, Situation, Place  O2 (2 Liters Oxygen ) Continent Weight: 172 lb 14.4 oz (78.4 kg) Height:  5\' 6"  (167.6 cm)  BEHAVIORAL SYMPTOMS/MOOD NEUROLOGICAL BOWEL NUTRITION STATUS   (none )  (none ) Continent Diet (Diet: Carb Modified )  AMBULATORY STATUS COMMUNICATION OF NEEDS Skin   Extensive Assist Verbally Surgical wounds (Incision: Right Hip. )                       Personal Care Assistance Level of Assistance  Bathing, Feeding, Dressing Bathing Assistance: Limited assistance Feeding assistance: Independent Dressing Assistance: Limited assistance     Functional Limitations Info  Sight, Hearing, Speech Sight Info: Adequate Hearing Info: Adequate Speech Info: Adequate    SPECIAL CARE FACTORS FREQUENCY  PT (By licensed PT), OT (By licensed OT)     PT Frequency:  (5) OT Frequency:  (5)            Contractures      Additional Factors Info  Code Status, Allergies, Insulin Sliding Scale Code Status Info:  (Full  Code. ) Allergies Info:  (No Active Allergies. )   Insulin Sliding Scale Info:  (NovoLog Insulin Injections. )       Current Medications (09/26/2015):  This is the current hospital active medication list Current Facility-Administered Medications  Medication Dose Route Frequency Provider Last Rate Last Dose  . 0.9 %  sodium chloride infusion  75 mL/hr Intravenous Continuous Juanell Fairly, MD 75 mL/hr at 09/26/15 0021 75 mL/hr at 09/26/15 0021  . acetaminophen (TYLENOL) tablet 650 mg  650 mg Oral Q6H PRN Juanell Fairly, MD   325 mg at 09/26/15 5621   Or  . acetaminophen (TYLENOL) suppository 650 mg  650 mg Rectal Q6H PRN Juanell Fairly, MD      . alum & mag hydroxide-simeth (MAALOX/MYLANTA) 200-200-20 MG/5ML suspension 30 mL  30 mL Oral Q4H PRN Juanell Fairly, MD      . atorvastatin (LIPITOR) tablet 20 mg  20 mg Oral Daily Oralia Manis, MD   20 mg at 09/26/15 0913  . bisacodyl (DULCOLAX) suppository 10 mg  10 mg Rectal Daily PRN Juanell Fairly, MD      . diltiazem (CARDIZEM CD) 24 hr capsule 240 mg  240 mg Oral Daily Oralia Manis, MD   240 mg at 09/26/15 0911  . docusate sodium (COLACE) capsule 100 mg  100 mg Oral BID Juanell Fairly, MD   100 mg at 09/26/15 0912  . ferrous sulfate tablet 325 mg  325 mg Oral TID PC Juanell FairlyKevin Krasinski, MD   325 mg at 09/26/15 0912  . HYDROcodone-acetaminophen (NORCO/VICODIN) 5-325 MG per tablet 1-2 tablet  1-2 tablet Oral Q6H PRN Juanell FairlyKevin Krasinski, MD   1 tablet at 09/26/15 0933  . HYDROmorphone (DILAUDID) injection 0.5 mg  0.5 mg Intravenous Q2H PRN Juanell FairlyKevin Krasinski, MD   0.5 mg at 09/26/15 0024  . insulin aspart (novoLOG) injection 0-9 Units  0-9 Units Subcutaneous Q6H Oralia Manisavid Willis, MD   1 Units at 09/26/15 0021  . latanoprost (XALATAN) 0.005 % ophthalmic solution 1 drop  1 drop Both Eyes QHS Oralia Manisavid Willis, MD   1 drop at 09/25/15 2227  . levothyroxine (SYNTHROID, LEVOTHROID) tablet 125 mcg  125 mcg Oral QAC breakfast Oralia Manisavid Willis, MD   125 mcg at 09/26/15 0912   . losartan (COZAAR) tablet 100 mg  100 mg Oral Daily Oralia Manisavid Willis, MD      . magnesium citrate solution 1 Bottle  1 Bottle Oral Once PRN Juanell FairlyKevin Krasinski, MD      . menthol-cetylpyridinium (CEPACOL) lozenge 3 mg  1 lozenge Oral PRN Juanell FairlyKevin Krasinski, MD       Or  . phenol (CHLORASEPTIC) mouth spray 1 spray  1 spray Mouth/Throat PRN Juanell FairlyKevin Krasinski, MD      . methocarbamol (ROBAXIN) tablet 500 mg  500 mg Oral Q6H PRN Juanell FairlyKevin Krasinski, MD       Or  . methocarbamol (ROBAXIN) 500 mg in dextrose 5 % 50 mL IVPB  500 mg Intravenous Q6H PRN Juanell FairlyKevin Krasinski, MD      . ondansetron [facility](ZOFRAN) tablet 4 mg  4 mg Oral Q6H PRN Juanell FairlyKevin Krasinski, MD       Or  . ondansetron [facility](ZOFRAN) injection 4 mg  4 mg Intravenous Q6H PRN Juanell FairlyKevin Krasinski, MD      . oxyCODONE (Oxy IR/ROXICODONE) immediate release tablet 10-15 mg  10-15 mg Oral Q4H PRN Juanell FairlyKevin Krasinski, MD      . polyethylene glycol (MIRALAX / GLYCOLAX) packet 17 g  17 g Oral Daily PRN Juanell FairlyKevin Krasinski, MD      . [facility](SENOKOT) tablet 8.6 mg  1 tablet Oral BID Juanell FairlyKevin Krasinski, MD   8.6 mg at 09/26/15 0912  . sodium chloride flush (NS) 0.9 % injection 3 mL  3 mL Intravenous Q12H Oralia Manisavid Willis, MD   3 mL at 09/24/15 2315  . timolol (BETIMOL) 0.5 % ophthalmic solution 1 drop  1 drop Both Eyes Daily Oralia Manisavid Willis, MD   1 drop at 09/25/15 2227  . Warfarin - Pharmacist Dosing Inpatient   Does not apply q1800 Olene FlossMelissa D Maccia, [facility] HospitalRPH         Discharge Medications: Please see discharge summary for a list of discharge medications.  Relevant Imaging Results:  Relevant Lab Results:   Additional Information  (SSN: [id number])  Nyemah Watton, Darleen CrockerBailey M, LCSW

## 2015-09-26 NOTE — Anesthesia Postprocedure Evaluation (Signed)
Anesthesia Post Note  Patient: Yolanda Cantu  Procedure(s) Performed: Procedure(s) (LRB): INTRAMEDULLARY (IM) NAIL INTERTROCHANTRIC (Right)  Patient location during evaluation: Nursing Unit Anesthesia Type: Spinal Level of consciousness: awake and alert Pain management: pain level controlled Vital Signs Assessment: post-procedure vital signs reviewed and stable Respiratory status: spontaneous breathing and respiratory function stable Cardiovascular status: blood pressure returned to baseline Postop Assessment: no headache Anesthetic complications: no    Last Vitals:  Vitals:   09/25/15 2305 09/26/15 0435  BP: 125/72 (!) 98/57  Pulse: (!) 112 (!) 110  Resp: 20 18  Temp: 36.8 C 36.4 C    Last Pain:  Vitals:   09/26/15 0637  TempSrc:   PainSc: 6                  Vernie MurdersPope,  Iridiana Fonner G

## 2015-09-26 NOTE — Evaluation (Signed)
Physical Therapy Evaluation Patient Details Name: Yolanda Cantu MRN: [id number] DOB: 08/17/1925 Today's Date: 09/26/2015   History of Present Illness  Pt is an 82 y.o. female s/p fall slipping on steps and then fell in kitching landing on R hip.  Pt sustained a R femoral neck fx and R large knee effusion.  Pt s/p R intertrochanteric IMN 8/27 and s/p R knee aspiration in PACU post-op.  PMH significant for a-fib on Coumadin, DM, htn, L TKA 2003, lower dentures, R distal radius fx and ulnar styloid fx November 2016.  Clinical Impression  Prior to admission, pt was independent with functional mobility without AD.  Pt lives alone in mobile home with 4 platform steps to enter with R railing.  Currently pt is max assist supine to sit (max assist x2 sit to supine); and max to total assist to attempt to stand (pt unable to clear bottom from bed with multiple trials and different techniques/cueing).  Pt very motivated to participate in PT but fatigued quickly with all activity requiring increased assist to get back to bed end of session.  Pt would benefit from skilled PT to address noted impairments and functional limitations.  Recommend pt discharge to STR when medically appropriate.     Follow Up Recommendations SNF    Equipment Recommendations   (TBD)    Recommendations for Other Services       Precautions / Restrictions Precautions Precautions: Fall Restrictions Weight Bearing Restrictions: Yes RLE Weight Bearing: Partial weight bearing RLE Partial Weight Bearing Percentage or Pounds: PWB R LE (50%) with walker      Mobility  Bed Mobility Overal bed mobility: Needs Assistance Bed Mobility: Supine to Sit;Sit to Supine     Supine to sit: Max assist;HOB elevated     General bed mobility comments: Supine to sit assist for trunk and B LE's (vc's for technique required and extra time to perform); sit to supine max assist x2 for trunk and B LE's (pt very fatigued from activities requiring  increased assist levels)  Transfers Overall transfer level: Needs assistance Equipment used: Rolling walker (2 wheeled);None Transfers: Sit to/from Stand Sit to Stand: Max assist;Total assist         General transfer comment: attempted 3 trials with RW and 2 trials standing in front of pt blocking L knee but unable to clear pt's bottom from bed with max to total assist and max cueing for technique  Ambulation/Gait             General Gait Details: Not appropriate at this time  Stairs            Wheelchair Mobility    Modified Rankin (Stroke Patients Only)       Balance Overall balance assessment: Needs assistance Sitting-balance support: Bilateral upper extremity supported;Feet supported Sitting balance-Leahy Scale: Fair Sitting balance - Comments: L lean to offweight R hip d/t pain Postural control: Left lateral lean                                   Pertinent Vitals/Pain Pain Assessment: 0-10 Pain Score: 5  Pain Location: R hip Pain Descriptors / Indicators: Sore;Tender Pain Intervention(s): Limited activity within patient's tolerance;Monitored during session;Premedicated before session;Repositioned;Ice applied  HR 110-116 bpm during session; O2 >92% on 2 L/min O2 via nasal cannula during session.    Home Living Family/patient expects to be discharged to:: Private residence Living Arrangements: Alone Available Help  at Discharge: Friend(s) Type of Home: Mobile home Home Access: Stairs to enter Entrance Stairs-Rails: Right Entrance Stairs-Number of Steps: 4 platform steps Home Layout: One level Home Equipment: Walker - 2 wheels      Prior Function Level of Independence: Independent         Comments: Prior to admission pt was independent without AD; denies any other falls (except recent fall).     Hand Dominance        Extremity/Trunk Assessment   Upper Extremity Assessment: Generalized weakness           Lower  Extremity Assessment: RLE deficits/detail;LLE deficits/detail (R knee flexion to grossly 50 degrees in sitting (limited d/t pain)) RLE Deficits / Details: R hip flexion at least 2/5; knee flexion/extension at least 3-/5; DF at least 4/5 LLE Deficits / Details: 4-/5 L hip flexion, knee flexion/extension, and DF     Communication   Communication: No difficulties  Cognition Arousal/Alertness: Awake/alert Behavior During Therapy: WFL for tasks assessed/performed Overall Cognitive Status: Within Functional Limits for tasks assessed                      General Comments General comments (skin integrity, edema, etc.): Dressing intact R hip with scant drainage noted; Ace wrap to R knee intact without drainage.  Nursing cleared pt for participation in physical therapy.  Pt agreeable to PT session.    Exercises Total Joint Exercises Ankle Circles/Pumps: AROM;Strengthening;Both;10 reps;Supine Quad Sets: AROM;Strengthening;Both;10 reps;Supine Short Arc Quad: Strengthening;Both;10 reps;Supine (AROM L; AAROM R) Heel Slides: Strengthening;Both;10 reps;Supine (AROM L; AAROM R) Hip ABduction/ADduction: Strengthening;Both;10 reps;Supine (AROM L; AAROM R)      Assessment/Plan    PT Assessment Patient needs continued PT services  PT Diagnosis Difficulty walking;Generalized weakness;Acute pain   PT Problem List Decreased strength;Decreased range of motion;Decreased activity tolerance;Decreased balance;Decreased mobility;Decreased knowledge of use of DME;Decreased knowledge of precautions;Pain  PT Treatment Interventions DME instruction;Gait training;Stair training;Functional mobility training;Therapeutic activities;Therapeutic exercise;Balance training;Patient/family education   PT Goals (Current goals can be found in the Care Plan section) Acute Rehab PT Goals Patient Stated Goal: to be able to get to the chair PT Goal Formulation: With patient Time For Goal Achievement: 10/10/15 Potential to  Achieve Goals: Fair    Frequency BID   Barriers to discharge Decreased caregiver support      Co-evaluation               End of Session Equipment Utilized During Treatment: Gait belt;Oxygen (2 L/min via nasal cannula) Activity Tolerance: Patient limited by fatigue;Patient limited by pain Patient left: in bed;with call bell/phone within reach;with bed alarm set;with family/visitor present;with SCD's reapplied (B heels elevated via pillows) Nurse Communication: Mobility status;Precautions;Weight bearing status         Time: 0981-1914 PT Time Calculation (min) (ACUTE ONLY): 41 min   Charges:   PT Evaluation $PT Eval Low Complexity: 1 Procedure PT Treatments $Therapeutic Exercise: 8-22 mins $Therapeutic Activity: 8-22 mins   PT G CodesHendricks Limes 10/26/2015, 11:23 AM Hendricks Limes, PT [phone]

## 2015-09-26 NOTE — Clinical Social Work Note (Signed)
Clinical Social Work Assessment  Patient Details  Name: Yolanda Cantu MRN: [id number] Date of Birth: November 30, 1925  Date of referral:  09/26/15               Reason for consult:  Facility Placement                Permission sought to share information with:  Chartered certified accountant granted to share information::  Yes, Verbal Permission Granted  Name::      [place]::   [place]   Relationship::     Contact Information:     Housing/Transportation Living arrangements for the past 2 months:  [place] of Information:  Patient Patient Interpreter Needed:  None Criminal Activity/Legal Involvement Pertinent to Current Situation/Hospitalization:  No - Comment as needed Significant Relationships:  Friend Lives with:  Self Do you feel safe going back to the place where you live?  Yes Need for family participation in patient care:  No (Coment)  Care giving concerns:  Patient lives alone in [place].    Social Worker assessment / plan:  Holiday representative (CSW) received SNF consult. PT is recommending SNF. CSW met with patient alone at bedside. Patient was alert and oriented and was laying in the bed. CSW introduced self and explained role of CSW department. Patient reported that she lives alone and has no children. Per patient her friend Bebe Shaggy is her HPOA and he lives in [place]. [place] explained that PT is recommending SNF. Patient is agreeable to SNF search and does not have a preference.   FL2 complete and faxed out. CSW will continue to follow and assist as needed.   Employment status:  Retired Nurse, adult PT Recommendations:  [place] / Referral to community resources:  [place]  Patient/Family's Response to care:  Patient is agreeable to [facility].   Patient/Family's Understanding of and Emotional Response to Diagnosis, Current Treatment, and  Prognosis:  Patient was very pleasant and thanked CSW for visit.   Emotional Assessment Appearance:  Appears stated age Attitude/Demeanor/Rapport:    Affect (typically observed):  Accepting, Adaptable, Pleasant Orientation:  Oriented to Self, Oriented to  Time, Oriented to Place, Oriented to Situation Alcohol / Substance use:  Not Applicable Psych involvement (Current and /or in the community):  No (Comment)  Discharge Needs  Concerns to be addressed:  Discharge Planning Concerns Readmission within the last 30 days:  No Current discharge risk:  Dependent with Mobility Barriers to Discharge:  Continued Medical Work up   [employer], Veronia Beets, LCSW 09/26/2015, 12:08 PM

## 2015-09-26 NOTE — Progress Notes (Addendum)
Subjective:  Postop day #1 status post intramedullary fixation for right intertrochanteric hip fracture and right knee aspiration. Patient is up out of bed to a chair. Patient reports right hip pain pain as marked.  She denies any shortness of breath, chest pain or abdominal pain.  Objective:   VITALS:   Vitals:   09/26/15 0800 09/26/15 0923 09/26/15 1200 09/26/15 1356  BP: (!) 95/54  104/60   Pulse: (!) 113   (!) 111  Resp: 16     Temp: 97.3 F (36.3 C)     TempSrc:      SpO2: 99% 96%  95%  Weight:      Height:        PHYSICAL EXAM:   Right lower extremity: Patient's hip and thigh dressings as well as her right knee Ace wrap are clean dry and intact. Patient has no calf tenderness. There is no erythema or ecchymosis or significant swelling of the right thigh. She has no calf tenderness or lower leg edema. She is palpable pedal pulses, intact sensation light touch and intact motor function in the right lower extremity.   LABS  Results for orders placed or performed during the hospital encounter of 09/23/15 (from the past 24 hour(s))  Glucose, capillary     Status: None   Collection Time: 09/25/15  6:43 PM  Result Value Ref Range   Glucose-Capillary 67 65 - 99 mg/dL  Glucose, capillary     Status: Abnormal   Collection Time: 09/25/15  6:58 PM  Result Value Ref Range   Glucose-Capillary 115 (H) 65 - 99 mg/dL  Glucose, capillary     Status: Abnormal   Collection Time: 09/25/15 11:59 PM  Result Value Ref Range   Glucose-Capillary 137 (H) 65 - 99 mg/dL   Comment 1 Notify RN   CBC     Status: Abnormal   Collection Time: 09/26/15  4:39 AM  Result Value Ref Range   WBC 13.9 (H) 3.6 - 11.0 K/uL   RBC 3.21 (L) 3.80 - 5.20 MIL/uL   Hemoglobin 9.8 (L) 12.0 - 16.0 g/dL   HCT 40.929.5 (L) 81.135.0 - 91.447.0 %   MCV 92.1 80.0 - 100.0 fL   MCH 30.6 26.0 - 34.0 pg   MCHC 33.2 32.0 - 36.0 g/dL   RDW 78.215.3 (H) 95.611.5 - 21.314.5 %   Platelets 225 150 - 440 K/uL  Basic metabolic panel     Status:  Abnormal   Collection Time: 09/26/15  4:39 AM  Result Value Ref Range   Sodium 129 (L) 135 - 145 mmol/L   Potassium 4.1 3.5 - 5.1 mmol/L   Chloride 99 (L) 101 - 111 mmol/L   CO2 26 22 - 32 mmol/L   Glucose, Bld 131 (H) 65 - 99 mg/dL   BUN 11 6 - 20 mg/dL   Creatinine, Ser 0.860.42 (L) 0.44 - 1.00 mg/dL   Calcium 7.9 (L) 8.9 - 10.3 mg/dL   GFR calc non Af Amer >60 >60 mL/min   GFR calc Af Amer >60 >60 mL/min   Anion gap 4 (L) 5 - 15  Protime-INR     Status: None   Collection Time: 09/26/15  4:42 AM  Result Value Ref Range   Prothrombin Time 14.7 11.4 - 15.2 seconds   INR 1.14   Glucose, capillary     Status: Abnormal   Collection Time: 09/26/15  6:06 AM  Result Value Ref Range   Glucose-Capillary 110 (H) 65 - 99 mg/dL  Comment 1 Notify RN   Glucose, capillary     Status: Abnormal   Collection Time: 09/26/15 12:08 PM  Result Value Ref Range   Glucose-Capillary 114 (H) 65 - 99 mg/dL   Comment 1 Notify RN     Dg Knee Right Port  Result Date: 09/24/2015 CLINICAL DATA:  Patient fell yesterday. Now with right knee pain and stiffness. EXAM: PORTABLE RIGHT KNEE - 1-2 VIEW COMPARISON:  None. FINDINGS: Loss of joint space noted in the medial compartment. Degenerative spurring visualized in all 3 compartments. Bones are diffusely demineralized. No worrisome lytic or sclerotic osseous abnormality. No evidence for joint effusion. IMPRESSION: Osteopenia with tricompartmental degenerative change. Electronically Signed   By: Kennith Center M.D.   On: 09/24/2015 16:46   Dg Hip Port Unilat With Pelvis 1v Right  Result Date: 09/25/2015 CLINICAL DATA:  Status post ORIF for right femoral neck fracture. EXAM: DG HIP (WITH OR WITHOUT PELVIS) 1V PORT RIGHT COMPARISON:  09/23/2015. FINDINGS: AP and cross-table lateral view of the right hip shows the patient to be status post dynamic hip screw placement for intertrochanteric femoral neck fracture. Bony alignment is improved compared to the pre reduction film.  No evidence for immediate hardware complications. Gas in the soft tissues is compatible with the immediate postoperative state. IMPRESSION: Status post ORIF for intertrochanteric right femoral neck fracture. Electronically Signed   By: Kennith Center M.D.   On: 09/25/2015 16:26   Dg Hip Operative Unilat W Or W/o Pelvis Right  Result Date: 09/25/2015 CLINICAL DATA:  Right hip nailing EXAM: OPERATIVE RIGHT HIP (WITH PELVIS IF PERFORMED) 4 VIEWS TECHNIQUE: Fluoroscopic spot image(s) were submitted for interpretation post-operatively. COMPARISON:  [id number] foul since 17 FINDINGS: Internal fixation across the right intertrochanteric fracture. Anatomic alignment. No hardware complicating feature. IMPRESSION: Internal fixation.  No complicating feature. Electronically Signed   By: Charlett Nose M.D.   On: 09/25/2015 15:26    Assessment/Plan: 1 Day Post-Op   Principal Problem:   Closed right hip fracture (HCC) Active Problems:   Diabetes (HCC)   HTN (hypertension)   Hypothyroidism   Atrial fibrillation [facility])  Patient is making progress postop. Postoperative pain is her main issue at this point. Patient has been restarted on her Coumadin. She is foot pumps and TED stockings as well. Continue physical therapy. The patient will require skilled nursing facility stay upon discharge.  Patient will be PARTIAL WEIGHT BEARING on the right lower extremity with the assistance of a walker for 4 weeks following surgery. Patient should continue to receive physical therapy for hip and lower extremity range of motion, strengthening and gait training. She should elevate the left lower extremity whenever possible. She may apply ice to the surgical site to help reduce swelling. Dressing should be changed when necessary basis if drainage is observed. Continue DVT prophylaxis for 4-6 weeks postop. Patient will follow with orthopedic surgeon in his office in 10-14 days. Staples will be removed in the orthopedic office.  [facility], Dr. Martha Clan, [phone]    Juanell Fairly , MD 09/26/2015, 4:00 PM

## 2015-09-26 NOTE — Progress Notes (Signed)
Physical Therapy Treatment Patient Details Name: Yolanda Cantu MRN: [id number] DOB: 1925/05/26 Today's Date: 09/26/2015    History of Present Illness Pt is an 82 y.o. female s/p fall slipping on steps and then fell in kitchen landing on R hip.  Pt sustained a R femoral neck fx and R large knee effusion.  Pt s/p R intertrochanteric IMN 8/27 and s/p R knee aspiration in PACU post-op.  PMH significant for a-fib on Coumadin, DM, htn, L TKA 2003, lower dentures, R distal radius fx and ulnar styloid fx November 2016.    PT Comments    Pt agreeable to PT and up in chair if able. Pain in Right lower extremity 8/10. Pt improved bed mobility with increased initiation of movement and less assist required. Pt requires Mod A x 2 for sit to stand transfer and rolling walker. Pt has difficulty stepping toward chair, but does demonstrate pivoting technique with Bilateral lower extremities. Pt does fatigue easily with long pauses require heavy cueing and encouragement to advance each lower extremity and continue moving; pauses prove to fatigue pt quickly. Pt heart rate increased briefly to 130 beats per minute post transfer; pt feels mildly faint, but improved within a minute. Pt comfortable up in chair. Nursing notified of treatment session. Continue PT to progress strength, endurance and all functional mobility.   Follow Up Recommendations  SNF     Equipment Recommendations   (TBD)    Recommendations for Other Services       Precautions / Restrictions Precautions Precautions: Fall Precaution Comments: 2L nasal cannula Restrictions Weight Bearing Restrictions: Yes RLE Weight Bearing: Partial weight bearing RLE Partial Weight Bearing Percentage or Pounds: PWB R LE (50%) with walker    Mobility  Bed Mobility Overal bed mobility: Needs Assistance Bed Mobility: Supine to Sit     Supine to sit: HOB elevated;Mod assist     General bed mobility comments: Able to scoot hips to edge of bed slowly; assist  for RLE over edge of bed and trunk elevation  Transfers Overall transfer level: Needs assistance Equipment used: Rolling walker (2 wheeled);None Transfers: Sit to/from Stand Sit to Stand: Mod assist;+2 physical assistance         General transfer comment: STS first attempt with Mod A x 2. Slow to attain full upright position with cues for glute squeeze  Ambulation/Gait Ambulation/Gait assistance: Mod assist;+2 physical assistance Ambulation Distance (Feet): 3 Feet Assistive device: Rolling walker (2 wheeled) Gait Pattern/deviations:  (primarily pivots B feet) Gait velocity: slow Gait velocity interpretation: <1.8 ft/sec, indicative of risk for recurrent falls General Gait Details: Slow pivotting steps with 1 deliberate step. Heavy cueing to continue moving/stepping. Pt with llong pauses creating fatigue and more difficulty  advancing to chair. Pt attempts premature sitting, requiring ultimate assisted lowering to chair.    Stairs            Wheelchair Mobility    Modified Rankin (Stroke Patients Only)       Balance Overall balance assessment: Needs assistance Sitting-balance support: Feet supported;Bilateral upper extremity supported Sitting balance-Leahy Scale: Fair Sitting balance - Comments: L lean to offweight R hip d/t pain Postural control: Left lateral lean Standing balance support: Bilateral upper extremity supported Standing balance-Leahy Scale: Poor                      Cognition Arousal/Alertness: Awake/alert Behavior During Therapy: WFL for tasks assessed/performed Overall Cognitive Status: Within Functional Limits for tasks assessed  Exercises Total Joint Exercises Ankle Circles/Pumps: AROM;Strengthening;Both;10 reps;Supine Quad Sets: AROM;Strengthening;Both;10 reps;Supine Short Arc Quad: Strengthening;Both;10 reps;Supine (AROM L; AAROM R) Heel Slides: Strengthening;Both;10 reps;Supine (AROM L; AAROM R) Hip  ABduction/ADduction: Strengthening;Both;10 reps;Supine (AROM L; AAROM R)    General Comments General comments (skin integrity, edema, etc.): Dressing intact R hip with scant drainage noted; Ace wrap to R knee intact without drainage      Pertinent Vitals/Pain Pain Assessment: 0-10 Pain Score: 8  Pain Location: RLE Pain Descriptors / Indicators: Constant Pain Intervention(s): Limited activity within patient's tolerance;Monitored during session;Premedicated before session;Repositioned;Ice applied    Home Living Family/patient expects to be discharged to:: Private residence Living Arrangements: Alone Available Help at Discharge: Friend(s) Type of Home: Mobile home Home Access: Stairs to enter Entrance Stairs-Rails: Right Home Layout: One level Home Equipment: Walker - 2 wheels      Prior Function Level of Independence: Independent      Comments: Prior to admission pt was independent without AD; denies any other falls (except recent fall).   PT Goals (current goals can now be found in the care plan section) Acute Rehab PT Goals Patient Stated Goal: "to get my strength back" PT Goal Formulation: With patient Time For Goal Achievement: 10/10/15 Potential to Achieve Goals: Fair Progress towards PT goals: Progressing toward goals (slowly)    Frequency  BID    PT Plan Current plan remains appropriate    Co-evaluation             End of Session Equipment Utilized During Treatment: Other (comment);Oxygen (Pt notes gait belt hurts; used 2 person under axilla tech) Activity Tolerance: Patient limited by fatigue;Patient limited by pain Patient left: in chair;with call bell/phone within reach;with chair alarm set;with family/visitor present     Time: [id number] PT Time Calculation (min) (ACUTE ONLY): 29 min  Charges:  $Therapeutic Exercise: 8-22 mins $Therapeutic Activity: 23-37 mins                    G CodesKristeen Miss:      Atira Borello Elizabeth Bishop, PTA 09/26/2015, 2:39 PM

## 2015-09-26 NOTE — Progress Notes (Signed)
Patient chose Yolanda Cantu. [facility]. Clinical Social Worker (CSW) will continue to follow and assist as needed.   Baker Hughes IncorporatedBailey Kadesia Robel, LCSW [phone]

## 2015-09-26 NOTE — Addendum Note (Signed)
Addendum  created 09/26/15 [id number] by Henrietta HooverKimberly Cobain Morici, CRNA   Sign clinical note

## 2015-09-27 LAB — CBC
HEMATOCRIT: 31 % — AB (ref 35.0–47.0)
Hemoglobin: 10 g/dL — ABNORMAL LOW (ref 12.0–16.0)
MCH: 30.2 pg (ref 26.0–34.0)
MCHC: 32.1 g/dL (ref 32.0–36.0)
MCV: 94.1 fL (ref 80.0–100.0)
PLATELETS: 252 10*3/uL (ref 150–440)
RBC: 3.3 MIL/uL — ABNORMAL LOW (ref 3.80–5.20)
RDW: 15.5 % — AB (ref 11.5–14.5)
WBC: 14.1 10*3/uL — AB (ref 3.6–11.0)

## 2015-09-27 LAB — GLUCOSE, CAPILLARY
GLUCOSE-CAPILLARY: 92 mg/dL (ref 65–99)
GLUCOSE-CAPILLARY: 97 mg/dL (ref 65–99)
Glucose-Capillary: 103 mg/dL — ABNORMAL HIGH (ref 65–99)
Glucose-Capillary: 106 mg/dL — ABNORMAL HIGH (ref 65–99)

## 2015-09-27 LAB — BASIC METABOLIC PANEL
ANION GAP: 5 (ref 5–15)
BUN: 11 mg/dL (ref 6–20)
CALCIUM: 8 mg/dL — AB (ref 8.9–10.3)
CO2: 27 mmol/L (ref 22–32)
Chloride: 99 mmol/L — ABNORMAL LOW (ref 101–111)
Creatinine, Ser: 0.44 mg/dL (ref 0.44–1.00)
GFR calc Af Amer: >60 mL/min (ref >60)
GFR calc non Af Amer: >60 mL/min (ref >60)
GLUCOSE: 97 mg/dL (ref 65–99)
Potassium: 4.1 mmol/L (ref 3.5–5.1)
Sodium: 131 mmol/L — ABNORMAL LOW (ref 135–145)

## 2015-09-27 LAB — PROTIME-INR
INR: 1.66
Prothrombin Time: 19.8 seconds — ABNORMAL HIGH (ref 11.4–15.2)

## 2015-09-27 MED ORDER — WARFARIN SODIUM 1 MG PO TABS
2.0000 mg | ORAL_TABLET | Freq: Once | ORAL | Status: AC
  Administered 2015-09-27: 2 mg via ORAL
  Filled 2015-09-27: qty 2

## 2015-09-27 NOTE — Progress Notes (Signed)
Physical Therapy Treatment Patient Details Name: Yolanda Cantu MRN: [id number] DOB: 1925/05/15 Today's Date: 09/27/2015    History of Present Illness Pt is an 82 y.o. female s/p fall slipping on steps and then fell in kitchen landing on R hip.  Pt sustained a R femoral neck fx and R large knee effusion.  Pt s/p R intertrochanteric IMN 8/27 and s/p R knee aspiration in PACU post-op.  PMH significant for a-fib on Coumadin, DM, htn, L TKA 2003, lower dentures, R distal radius fx and ulnar styloid fx November 2016.    PT Comments    Pt agreeable to PT for bed exercises; pt extremely lethargic and doesn't open eyes often. Pt is responsive and notes pain is medium in R hip and knee. Pt participates in lower extremity exercises with assist as needed. Pt has good neighbors that know her very well and note pt's affect is far from normal; they note pt was very active and always on the move. Continue PT to progress for improved strength, range and all functional mobility.   Follow Up Recommendations  SNF     Equipment Recommendations       Recommendations for Other Services       Precautions / Restrictions Restrictions Weight Bearing Restrictions: Yes RLE Weight Bearing: Partial weight bearing    Mobility  Bed Mobility Overal bed mobility: Needs Assistance Bed Mobility: Supine to Sit     Supine to sit: HOB elevated;Mod assist     General bed mobility comments: Not tested; too lethargic  Transfers Overall transfer level: Needs assistance Equipment used: Rolling walker (2 wheeled);None Transfers: Sit to/from Stand Sit to Stand: Mod assist;From elevated surface         General transfer comment: Tolerates gait belt today; slow to rise with cues required for glute squeeze and QS for upright stance  Ambulation/Gait Ambulation/Gait assistance: Mod assist Ambulation Distance (Feet): 3 Feet Assistive device: Rolling walker (2 wheeled) Gait Pattern/deviations:  (pivots on BLE; moreso on  R; extreme difficulty advacning LLE) Gait velocity: slow Gait velocity interpretation: <1.8 ft/sec, indicative of risk for recurrent falls General Gait Details: Again significant pauses, which fatigue pt further. Heavy cueing for advancing BLE. Pt only able to demonstrate slight pivot movement more so on R. Difficulty with LLE despite attempted physical assist. Pt ultimately requires chair brought closer to pt and requires heavy cueing to encourage sit. Pt notes feeling faint. Cold cloth given to forehead; HR and O2 sats unchanged from iniitial   Stairs            Wheelchair Mobility    Modified Rankin (Stroke Patients Only)       Balance Overall balance assessment: Needs assistance Sitting-balance support: Feet supported Sitting balance-Leahy Scale: Fair     Standing balance support: Bilateral upper extremity supported Standing balance-Leahy Scale: Fair                      Cognition Arousal/Alertness: Lethargic Behavior During Therapy: Flat affect Overall Cognitive Status: Within Functional Limits for tasks assessed (very different from baaseline according to close friends)                      Exercises General Exercises - Lower Extremity Ankle Circles/Pumps: AROM;Both;20 reps;Supine Quad Sets: Strengthening;Both;20 reps;Supine Gluteal Sets: Strengthening;Both;20 reps;Supine Short Arc Quad: AROM;Both;20 reps;Supine Heel Slides: AAROM;Both;20 reps;Supine Hip ABduction/ADduction: AAROM;Both;20 reps;Supine Straight Leg Raises: AAROM;Both;20 reps;Supine    General Comments  Pertinent Vitals/Pain Pain Assessment:  (medium pain in R knee) Pain Intervention(s): Premedicated before session    Home Living                      Prior Function            PT Goals (current goals can now be found in the care plan section) Progress towards PT goals: Not progressing toward goals - comment    Frequency  BID    PT Plan Current plan  remains appropriate    Co-evaluation             End of Session Equipment Utilized During Treatment: Oxygen Activity Tolerance: Patient limited by lethargy Patient left: with call bell/phone within reach;in bed;with bed alarm set;with family/visitor present     Time: 1413-1436 PT Time Calculation (min) (ACUTE ONLY): 23 min  Charges:  $Gait Training: 8-22 mins $Therapeutic Exercise: 23-37 mins                    G CodesKristeen Miss:      Veria Stradley Elizabeth Bishop, PTA 09/27/2015, 2:33 PM

## 2015-09-27 NOTE — Progress Notes (Signed)
Pt unable to void since this AM. In/Out cath performed, output. Will notify MD. Pt stated she has had this problem post-surgery before, resolved on own after 24 hours. Will continue to monitor. Laxative given for BM, pt c/o constipation as well.

## 2015-09-27 NOTE — Progress Notes (Signed)
Physical Therapy Treatment Patient Details Name: Yolanda Cantu MRN: [id number] DOB: 09-08-1925 Today's Date: 09/27/2015    History of Present Illness Pt is an 82 y.o. female s/p fall slipping on steps and then fell in kitchen landing on R hip.  Pt sustained a R femoral neck fx and R large knee effusion.  Pt s/p R intertrochanteric IMN 8/27 and s/p R knee aspiration in PACU post-op.  PMH significant for a-fib on Coumadin, DM, htn, L TKA 2003, lower dentures, R distal radius fx and ulnar styloid fx November 2016.    PT Comments    Pt agreeable to PT; flat affect. Heart rate continues mildly elevated, but unchanged pre and post treatment. O2 saturation stable within normal limits on 2 liters. Pt continues to require Mod A for bed mobility and demonstrates significant weakness on right; currently denies pain. Sit to stand with Mod A and heavy cueing/encouragement for upright posture. Pt again very slow to move/follow instructions for ambulation bed to chair with long pauses. Pt does give attempts to pivot feet, right greater than left. Pt gets stuck half way to chair that was very close, but ultimately needs moved up against bed and behind pt's Left lower extremity and requires primarily a dependent sit with heavy cues. Pt notes feeling faint post transfer and overheated. Pt provided with cold cloth; heart rate and O2 saturation unchanged from initial reads. Pt encouraged to drink water. Pt recover within a minute and comfortable in chair. Participates in several limited exercises. Continue PT to progress strength, activity tolerance and all functional mobility.   Follow Up Recommendations  SNF     Equipment Recommendations       Recommendations for Other Services       Precautions / Restrictions Restrictions Weight Bearing Restrictions: Yes RLE Weight Bearing: Partial weight bearing    Mobility  Bed Mobility Overal bed mobility: Needs Assistance Bed Mobility: Supine to Sit     Supine to  sit: HOB elevated;Mod assist     General bed mobility comments: Assist for RLE and trunk  Transfers Overall transfer level: Needs assistance Equipment used: Rolling walker (2 wheeled);None Transfers: Sit to/from Stand Sit to Stand: Mod assist;From elevated surface         General transfer comment: Tolerates gait belt today; slow to rise with cues required for glute squeeze and QS for upright stance  Ambulation/Gait Ambulation/Gait assistance: Mod assist Ambulation Distance (Feet): 3 Feet Assistive device: Rolling walker (2 wheeled) Gait Pattern/deviations:  (pivots on BLE; moreso on R; extreme difficulty advacning LLE) Gait velocity: slow Gait velocity interpretation: <1.8 ft/sec, indicative of risk for recurrent falls General Gait Details: Again significant pauses, which fatigue pt further. Heavy cueing for advancing BLE. Pt only able to demonstrate slight pivot movement more so on R. Difficulty with LLE despite attempted physical assist. Pt ultimately requires chair brought closer to pt and requires heavy cueing to encourage sit. Pt notes feeling faint. Cold cloth given to forehead; HR and O2 sats unchanged from iniitial   Stairs            Wheelchair Mobility    Modified Rankin (Stroke Patients Only)       Balance Overall balance assessment: Needs assistance Sitting-balance support: Feet supported Sitting balance-Leahy Scale: Fair     Standing balance support: Bilateral upper extremity supported Standing balance-Leahy Scale: Fair                      Cognition Arousal/Alertness: Awake/alert Behavior  During Therapy: Flat affect Overall Cognitive Status: Within Functional Limits for tasks assessed                      Exercises General Exercises - Lower Extremity Ankle Circles/Pumps: AROM;Both;20 reps Quad Sets: Strengthening;Both;10 reps Gluteal Sets: Strengthening;Both;10 reps Heel Slides: AAROM;10 reps;Both Hip ABduction/ADduction:  AAROM;10 reps;Both    General Comments        Pertinent Vitals/Pain Pain Assessment: No/denies pain Pain Intervention(s): Premedicated before session    Home Living                      Prior Function            PT Goals (current goals can now be found in the care plan section) Progress towards PT goals: Progressing toward goals (slowly)    Frequency  BID    PT Plan Current plan remains appropriate    Co-evaluation             End of Session Equipment Utilized During Treatment: Gait belt Activity Tolerance: Patient limited by fatigue Patient left: in chair;with call bell/phone within reach;with chair alarm set     Time: 1044-1110 PT Time Calculation (min) (ACUTE ONLY): 26 min  Charges:  $Gait Training: 8-22 mins $Therapeutic Exercise: 8-22 mins                    G Codes:      Kristeen MissHeidi Elizabeth Bishop, PTA 09/27/2015, 11:14 AM

## 2015-09-27 NOTE — Progress Notes (Signed)
Per MD patient will likely be ready for D/C tomorrow. [facility] is aware of above. Clinical Social Worker (CSW) will continue to follow and assist as needed.   Baker Hughes IncorporatedBailey Bashar Milam, LCSW [phone]

## 2015-09-27 NOTE — Consult Note (Signed)
ANTICOAGULATION CONSULT NOTE - Initial Consult  Pharmacy Consult for warfarin Indication: atrial fibrillation and VTE prophylaxis  No Active Allergies  Patient Measurements: Height: 5\' 6"  (167.6 cm) Weight: 172 lb 14.4 oz (78.4 kg) IBW/kg (Calculated) : 59.3 Heparin Dosing Weight:   Vital Signs: Temp: 97.6 F (36.4 C) (08/29 1535) Temp Source: Oral (08/29 1535) BP: 133/58 (08/29 1535) Pulse Rate: 71 (08/29 1535)  Labs:  Recent Labs  09/25/15 0349 09/26/15 0439 09/26/15 0442 09/27/15 0509  HGB 10.9* 9.8*  --  10.0*  HCT 32.7* 29.5*  --  31.0*  PLT 267 225  --  252  LABPROT 14.6  --  14.7 19.8*  INR 1.13  --  1.14 1.66  CREATININE 0.68 0.42*  --  0.44    Estimated Creatinine Clearance: 51.3 mL/min (by C-G formula based on SCr of 0.8 mg/dL).   Medical History: Past Medical History:  Diagnosis Date  . Arthritis    knees  . Atrial fibrillation (HCC)   . Diabetes mellitus without complication (HCC)   . Hypercholesteremia   . Hypertension   . Hypothyroidism   . Thyroid disease   . Wears dentures    partial lower    Medications:  Scheduled:  . atorvastatin  20 mg Oral Daily  . diltiazem  240 mg Oral Daily  . docusate sodium  100 mg Oral BID  . enoxaparin (LOVENOX) injection  40 mg Subcutaneous Q24H  . ferrous sulfate  325 mg Oral TID PC  . insulin aspart  0-9 Units Subcutaneous Q6H  . latanoprost  1 drop Both Eyes QHS  . levothyroxine  125 mcg Oral QAC breakfast  . senna  1 tablet Oral BID  . sodium chloride flush  3 mL Intravenous Q12H  . timolol  1 drop Both Eyes Daily  . warfarin  2 mg Oral ONCE-1800  . Warfarin - Pharmacist Dosing Inpatient   Does not apply q1800    Assessment: Pt is a 82 year old female with a PMH of afib s/p hip surgery. Pt is one warfarin at home. Home dose is 2.5mg  daily. Patient received 2 doses of IV vit K prior to surgery.    Goal of Therapy:  INR 2-3 Monitor platelets by anticoagulation protocol: Yes   Plan:  Will  order warfarin 2mg . Will continue to check daily INRs.    Pharmacy will continue to monitor and adjust per consult.    Simpson,Michael L 09/27/2015,4:43 PM

## 2015-09-27 NOTE — Progress Notes (Signed)
[facility] [place] [facility]   PATIENT NAME: Yolanda Cantu    MR#:  [id number]  DATE OF BIRTH:  10/07/1925  SUBJECTIVE:  CHIEF COMPLAINT:   Chief Complaint  Patient presents with  . Fall    Pt. states mechanical fall in kitchen tonight.  Pt. has rotation of rt. hip and shortning.   No complaint. HR 1110-120. S/p right hip surgery POD2. REVIEW OF SYSTEMS:  Review of Systems  Constitutional: Negative for chills, fever and malaise/fatigue.  HENT: Negative for congestion.   Eyes: Negative for blurred vision and double vision.  Respiratory: Negative for cough, shortness of breath and wheezing.   Cardiovascular: Negative for chest pain, palpitations and leg swelling.  Gastrointestinal: Negative for abdominal pain, blood in stool, constipation, diarrhea, melena, nausea and vomiting.  Genitourinary: Negative for dysuria and urgency.  Musculoskeletal: Negative for joint pain.  Neurological: Negative for dizziness, focal weakness and headaches.  Psychiatric/Behavioral: Negative for depression.    DRUG ALLERGIES:  No Active Allergies VITALS:  Blood pressure (!) 101/59, pulse 98, temperature 97.9 F (36.6 C), temperature source Oral, resp. rate 18, height 5\' 6"  (1.676 m), weight 172 lb 14.4 oz (78.4 kg), SpO2 95 %. PHYSICAL EXAMINATION:  Physical Exam  Constitutional: She is oriented to person, place, and time and well-developed, well-nourished, and in no distress. No distress.  HENT:  Head: Normocephalic and atraumatic.  Mouth/Throat: Oropharynx is clear and moist.  Eyes: Conjunctivae and EOM are normal. Pupils are equal, round, and reactive to light.  Neck: Normal range of motion. Neck supple. No JVD present.  Cardiovascular: Normal heart sounds.  Exam reveals no gallop.   No murmur heard. Irregular rythm  Pulmonary/Chest: No respiratory distress. She has no wheezes. She has no rales.  Abdominal: She exhibits no distension. There is no tenderness.    Musculoskeletal: She exhibits no edema or tenderness.  Neurological: She is alert and oriented to person, place, and time.  Skin: Skin is warm.  Psychiatric: Affect normal.   LABORATORY PANEL:   CBC  Recent Labs Lab 09/27/15 0509  WBC 14.1*  HGB 10.0*  HCT 31.0*  PLT 252   ------------------------------------------------------------------------------------------------------------------ Chemistries   Recent Labs Lab 09/25/15 0349  09/27/15 0509  NA 131*  < > 131*  K 4.1  < > 4.1  CL 96*  < > 99*  CO2 29  < > 27  GLUCOSE 117*  < > 97  BUN 8  < > 11  CREATININE 0.68  < > 0.44  CALCIUM 8.5*  < > 8.0*  MG 1.8  --   --   < > = values in this interval not displayed. RADIOLOGY:  No results found. ASSESSMENT AND PLAN:   Closed right hip fracture. POD2. Pain control,  Warfarin was hold for surger, she was given Vit K X 2. Resumed coumadin, f/u INR. On lovenox DVT prophylaxis.    Diabetes (HCC) - on sliding scale.    HTN (hypertension).  Continue Cardizem but hold losartan due to low side blood pressure.    Atrial fibrillation with RVR - continue Cardizem, resumed warfarin.    Hypothyroidism - home dose thyroid replacement  Leukocytosis. Due to reaction. Follow-up CBC. Hypomagnesemia. Give IV magnesium and follow-up level. Hypokalemia. improved with potassium supplement. Hyponatremia, looks like chronic, discontinue normal saline IV.  Anemia of acute blood loss, possible due to hip fracture and surgery. Hb is stable.  All the records are reviewed and case discussed with Care Management/Social Worker. Management  plans discussed with the patient, family and they are in agreement.  CODE STATUS: Full code  TOTAL TIME TAKING CARE OF THIS PATIENT: 26 minutes.   More than 50% of the time was spent in counseling/coordination of care: YES  POSSIBLE D/C IN 1-2 DAYS, DEPENDING ON CLINICAL CONDITION.   Shaune Pollack M.D on 09/27/2015 at 2:51 PM  Between 7am to 6pm -  Pager - [phone]  After 6pm go to [email or web address] - Social research officer, government  [facility]  Office  [phone]  CC: Primary care physician; Marisue Ivan, MD  Note: This dictation was prepared with Dragon dictation along with smaller phrase technology. Any transcriptional errors that result from this process are unintentional.

## 2015-09-27 NOTE — Progress Notes (Signed)
Subjective:  Patient is postop day #2 status post intramedullary fixation for right intertrochanteric hip fracture. Patient reports right hip pain as mild to moderate.  She is up out of bed to a chair. She has no other complaints. She states that her right knee pain is improved.  Objective:   VITALS:   Vitals:   09/27/15 0339 09/27/15 0937 09/27/15 1044 09/27/15 1113  BP: 125/77 106/66  (!) 101/59  Pulse: (!) 115 (!) 116 (!) 117 (!) 121  Resp: 19 18  18   Temp: 98.5 F (36.9 C) 98.3 F (36.8 C)  97.9 F (36.6 C)  TempSrc: Oral Oral  Oral  SpO2: 100% 95% 95% 97%  Weight:      Height:        PHYSICAL EXAM:  Right lower extremity: Patient's dressings remain clean dry and intact. The Ace wrap of the right knee is also clean dry and intact. Patient has foot pumps and TED stockings in place. She has intact sensation light touch throughout the right lower extremity and palpable pedal pulses. She can flex and extend her toes and dorsiflex and plantarflex her ankle.   LABS  Results for orders placed or performed during the hospital encounter of 09/23/15 (from the past 24 hour(s))  Glucose, capillary     Status: Abnormal   Collection Time: 09/26/15 12:08 PM  Result Value Ref Range   Glucose-Capillary 114 (H) 65 - 99 mg/dL   Comment 1 Notify RN   Glucose, capillary     Status: Abnormal   Collection Time: 09/26/15  4:46 PM  Result Value Ref Range   Glucose-Capillary 119 (H) 65 - 99 mg/dL   Comment 1 Notify RN    Comment 2 Document in Chart   Glucose, capillary     Status: Abnormal   Collection Time: 09/26/15 11:53 PM  Result Value Ref Range   Glucose-Capillary 112 (H) 65 - 99 mg/dL  CBC     Status: Abnormal   Collection Time: 09/27/15  5:09 AM  Result Value Ref Range   WBC 14.1 (H) 3.6 - 11.0 K/uL   RBC 3.30 (L) 3.80 - 5.20 MIL/uL   Hemoglobin 10.0 (L) 12.0 - 16.0 g/dL   HCT 45.431.0 (L) 09.835.0 - 11.947.0 %   MCV 94.1 80.0 - 100.0 fL   MCH 30.2 26.0 - 34.0 pg   MCHC 32.1 32.0 - 36.0  g/dL   RDW 14.715.5 (H) 82.911.5 - 56.214.5 %   Platelets 252 150 - 440 K/uL  Basic metabolic panel     Status: Abnormal   Collection Time: 09/27/15  5:09 AM  Result Value Ref Range   Sodium 131 (L) 135 - 145 mmol/L   Potassium 4.1 3.5 - 5.1 mmol/L   Chloride 99 (L) 101 - 111 mmol/L   CO2 27 22 - 32 mmol/L   Glucose, Bld 97 65 - 99 mg/dL   BUN 11 6 - 20 mg/dL   Creatinine, Ser 1.300.44 0.44 - 1.00 mg/dL   Calcium 8.0 (L) 8.9 - 10.3 mg/dL   GFR calc non Af Amer >60 >60 mL/min   GFR calc Af Amer >60 >60 mL/min   Anion gap 5 5 - 15  Protime-INR     Status: Abnormal   Collection Time: 09/27/15  5:09 AM  Result Value Ref Range   Prothrombin Time 19.8 (H) 11.4 - 15.2 seconds   INR 1.66   Glucose, capillary     Status: None   Collection Time: 09/27/15  5:29 AM  Result Value Ref Range   Glucose-Capillary 92 65 - 99 mg/dL  Glucose, capillary     Status: Abnormal   Collection Time: 09/27/15 11:15 AM  Result Value Ref Range   Glucose-Capillary 103 (H) 65 - 99 mg/dL   Comment 1 Notify RN     Dg Hip Port Unilat With Pelvis 1v Right  Result Date: 09/25/2015 CLINICAL DATA:  Status post ORIF for right femoral neck fracture. EXAM: DG HIP (WITH OR WITHOUT PELVIS) 1V PORT RIGHT COMPARISON:  09/23/2015. FINDINGS: AP and cross-table lateral view of the right hip shows the patient to be status post dynamic hip screw placement for intertrochanteric femoral neck fracture. Bony alignment is improved compared to the pre reduction film. No evidence for immediate hardware complications. Gas in the soft tissues is compatible with the immediate postoperative state. IMPRESSION: Status post ORIF for intertrochanteric right femoral neck fracture. Electronically Signed   By: Kennith Center M.D.   On: 09/25/2015 16:26   Dg Hip Operative Unilat W Or W/o Pelvis Right  Result Date: 09/25/2015 CLINICAL DATA:  Right hip nailing EXAM: OPERATIVE RIGHT HIP (WITH PELVIS IF PERFORMED) 4 VIEWS TECHNIQUE: Fluoroscopic spot image(s) were  submitted for interpretation post-operatively. COMPARISON:  [id number] foul since 17 FINDINGS: Internal fixation across the right intertrochanteric fracture. Anatomic alignment. No hardware complicating feature. IMPRESSION: Internal fixation.  No complicating feature. Electronically Signed   By: Charlett Nose M.D.   On: 09/25/2015 15:26    Assessment/Plan: 2 Days Post-Op   Principal Problem:   Closed right hip fracture (HCC) Active Problems:   Diabetes (HCC)   HTN (hypertension)   Hypothyroidism   Atrial fibrillation [facility])  Patient continues to make progress postop. Her pain is improving. She is progressing with physical and occupational therapy.  Patient was restarted on her Coumadin. INR today is 1.66. Patient has a stable hemoglobin and hematocrit. Plan is for discharge TOMORROW. She will remain partial weightbearing on the right lower extremity with her walker for 4 weeks postop. She will continue Coumadin for DVT prophylaxis. Patient will follow up with me in 2 weeks in the office for wound check and staple removal.    Juanell Fairly , MD 09/27/2015, 12:02 PM

## 2015-09-27 NOTE — Progress Notes (Signed)
Bladder scanned patient because she had not urinated. Bladder scan showed 503 ml of urine. Dr called, order given for I&O cath, 600 ml or urine out. Will continue to monitor.

## 2015-09-28 LAB — CBC
HCT: 30 % — ABNORMAL LOW (ref 35.0–47.0)
Hemoglobin: 10 g/dL — ABNORMAL LOW (ref 12.0–16.0)
MCH: 30.5 pg (ref 26.0–34.0)
MCHC: 33.3 g/dL (ref 32.0–36.0)
MCV: 91.7 fL (ref 80.0–100.0)
PLATELETS: 296 10*3/uL (ref 150–440)
RBC: 3.28 MIL/uL — AB (ref 3.80–5.20)
RDW: 15.4 % — ABNORMAL HIGH (ref 11.5–14.5)
WBC: 14.4 10*3/uL — ABNORMAL HIGH (ref 3.6–11.0)

## 2015-09-28 LAB — GLUCOSE, CAPILLARY
GLUCOSE-CAPILLARY: 80 mg/dL (ref 65–99)
Glucose-Capillary: 109 mg/dL — ABNORMAL HIGH (ref 65–99)
Glucose-Capillary: 94 mg/dL (ref 65–99)

## 2015-09-28 LAB — PROTIME-INR
INR: 1.93
PROTHROMBIN TIME: 22.3 s — AB (ref 11.4–15.2)

## 2015-09-28 LAB — BODY FLUID CULTURE
CULTURE: NO GROWTH
SPECIAL REQUESTS: NORMAL

## 2015-09-28 MED ORDER — WARFARIN SODIUM 1 MG PO TABS: 2.0000 mg | ORAL_TABLET | Freq: Once | ORAL | Status: DC

## 2015-09-28 MED ORDER — HYDROCODONE-ACETAMINOPHEN 5-325 MG PO TABS: 1.0000 | ORAL_TABLET | Freq: Four times a day (QID) | ORAL | 0 refills | Status: DC | PRN

## 2015-09-28 MED ORDER — SENNA 8.6 MG PO TABS: 1.0000 | ORAL_TABLET | Freq: Two times a day (BID) | ORAL | 0 refills | Status: DC

## 2015-09-28 MED ORDER — FERROUS SULFATE 325 (65 FE) MG PO TABS: 325.0000 mg | ORAL_TABLET | Freq: Three times a day (TID) | ORAL | Status: DC

## 2015-09-28 MED ORDER — LACTULOSE 10 GM/15ML PO SOLN: 30.0000 g | Freq: Two times a day (BID) | ORAL | Status: DC | PRN

## 2015-09-28 MED ORDER — POLYETHYLENE GLYCOL 3350 17 G PO PACK: 17.0000 g | PACK | Freq: Every day | ORAL | 0 refills | Status: DC | PRN

## 2015-09-28 NOTE — Progress Notes (Signed)
Patient was bladder scanned, it showed 450 ml of urine. Patient was I&O,  400 ml of urine was removed.

## 2015-09-28 NOTE — Discharge Summary (Addendum)
[facility] [place] [facility]   PATIENT NAME: Yolanda Cantu    MR#:  [id number]  DATE OF BIRTH:  02-08-1925  DATE OF ADMISSION:  09/23/2015   ADMITTING PHYSICIAN: Oralia Manis, MD  DATE OF DISCHARGE: 09/28/2015  PRIMARY CARE PHYSICIAN: Marisue Ivan, MD   ADMISSION DIAGNOSIS:  Closed right hip fracture, initial encounter (HCC) [S72.001A] DISCHARGE DIAGNOSIS:  Principal Problem:   Closed right hip fracture (HCC) Active Problems:   Diabetes (HCC)   HTN (hypertension)   Hypothyroidism   Atrial fibrillation (HCC) Anemia of acute blood loss, SECONDARY DIAGNOSIS:   Past Medical History:  Diagnosis Date  . Arthritis    knees  . Atrial fibrillation (HCC)   . Diabetes mellitus without complication (HCC)   . Hypercholesteremia   . Hypertension   . Hypothyroidism   . Thyroid disease   . Wears dentures    partial lower   HOSPITAL COURSE:   Closed right hip fracture. POD3. Pain control,  Warfarin was hold for surger, she was given Vit K X 2. Resumed coumadin, f/u INR is 1.93. On lovenox DVT prophylaxis.  Diabetes (HCC) - on sliding scale.  HTN (hypertension).  Continue Cardizem but hold losartan due to low side blood pressure.  Atrial fibrillation with RVR - continue Cardizem, resumed warfarin.  Hypothyroidism - home dose thyroid replacement  Leukocytosis. Due to reaction. Follow-up CBC as outpatient. Hypomagnesemia. Improved with IV magnesium. Hypokalemia. improved with potassium supplement. Hyponatremia, looks like chronic, discontinued normal saline IV.  Anemia of acute blood loss, possible due to hip fracture and surgery. Hb is stable.  DISCHARGE CONDITIONS:  Stable, discharge to SNF today. CONSULTS OBTAINED:  Treatment Team:  Juanell Fairly, MD DRUG ALLERGIES:  No Active Allergies DISCHARGE MEDICATIONS:     Medication List    STOP taking these medications   amoxicillin 500 MG capsule Commonly known as:  AMOXIL   losartan 50 MG tablet Commonly known as:  COZAAR     TAKE these medications   acetaminophen 500 MG tablet Commonly known as:  TYLENOL Take 500 mg by mouth every 6 (six) hours as needed.   atorvastatin 40 MG tablet Commonly known as:  LIPITOR Take 20 mg by mouth daily.   diltiazem 240 MG 24 hr capsule Commonly known as:  TIAZAC Take 240 mg by mouth daily.   diphenhydramine-acetaminophen 25-500 MG Tabs tablet Commonly known as:  TYLENOL PM Take 2 tablets by mouth at bedtime as needed.   ferrous sulfate 325 (65 FE) MG tablet Take 1 tablet (325 mg total) by mouth 3 (three) times daily after meals.   HYDROcodone-acetaminophen 5-325 MG tablet Commonly known as:  NORCO/VICODIN Take 1-2 tablets by mouth every 6 (six) hours as needed for moderate pain.   latanoprost 0.005 % ophthalmic solution Commonly known as:  XALATAN Place 1 drop into both eyes at bedtime.   levothyroxine 125 MCG tablet Commonly known as:  SYNTHROID, LEVOTHROID Take 125 mcg by mouth daily before breakfast.   metFORMIN 500 MG tablet Commonly known as:  GLUCOPHAGE Take 250 mg by mouth 2 (two) times daily with a meal.   polyethylene glycol packet Commonly known as:  MIRALAX / GLYCOLAX Take 17 g by mouth daily as needed for mild constipation.   senna 8.6 MG Tabs tablet Commonly known as:  SENOKOT Take 1 tablet (8.6 mg total) by mouth 2 (two) times daily.   timolol 0.5 % ophthalmic solution Commonly known as:  BETIMOL Place 1 drop into both eyes daily.  VITAMIN B-12 IJ Inject as directed every 30 (thirty) days.   warfarin 2 MG tablet Commonly known as:  COUMADIN Take 2 mg by mouth daily.        DISCHARGE INSTRUCTIONS:   DIET:  Heart healthy and ADA diet. DISCHARGE CONDITION:  Stable. ACTIVITY:  As tolerated. DISCHARGE LOCATION:    If you experience worsening of your admission symptoms, develop shortness of breath, life threatening emergency, suicidal or homicidal thoughts you must  seek medical attention immediately by calling 911 or calling your MD immediately  if symptoms less severe.  You Must read complete instructions/literature along with all the possible adverse reactions/side effects for all the Medicines you take and that have been prescribed to you. Take any new Medicines after you have completely understood and accpet all the possible adverse reactions/side effects.   Please note  You were cared for by a hospitalist during your hospital stay. If you have any questions about your discharge medications or the care you received while you were in the hospital after you are discharged, you can call the unit and asked to speak with the hospitalist on call if the hospitalist that took care of you is not available. Once you are discharged, your primary care physician will handle any further medical issues. Please note that NO REFILLS for any discharge medications will be authorized once you are discharged, as it is imperative that you return to your primary care physician (or establish a relationship with a primary care physician if you do not have one) for your aftercare needs so that they can reassess your need for medications and monitor your lab values.    On the day of Discharge:  VITAL SIGNS:  Blood pressure (!) 110/58, pulse 78, temperature 97.9 F (36.6 C), temperature source Oral, resp. rate 18, height 5\' 6"  (1.676 m), weight 172 lb 14.4 oz (78.4 kg), SpO2 98 %. PHYSICAL EXAMINATION:  GENERAL:  82 y.o.-year-old patient lying in the bed with no acute distress.  EYES: Pupils equal, round, reactive to light and accommodation. No scleral icterus. Extraocular muscles intact.  HEENT: Head atraumatic, normocephalic. Oropharynx and nasopharynx clear.  NECK:  Supple, no jugular venous distention. No thyroid enlargement, no tenderness.  LUNGS: Normal breath sounds bilaterally, no wheezing, rales,rhonchi or crepitation. No use of accessory muscles of respiration.    CARDIOVASCULAR: S1, S2 normal. No murmurs, rubs, or gallops.  ABDOMEN: Soft, non-tender, non-distended. Bowel sounds present. No organomegaly or mass.  EXTREMITIES: No pedal edema, cyanosis, or clubbing.  NEUROLOGIC: Cranial nerves II through XII are intact. Muscle strength 3-4/5 in all extremities. Sensation intact. Gait not checked.  PSYCHIATRIC: The patient is alert and oriented x 3.  SKIN: No obvious rash, lesion, or ulcer.  DATA REVIEW:   CBC  Recent Labs Lab 09/28/15 0653  WBC 14.4*  HGB 10.0*  HCT 30.0*  PLT 296    Chemistries   Recent Labs Lab 09/25/15 0349  09/27/15 0509  NA 131*  < > 131*  K 4.1  < > 4.1  CL 96*  < > 99*  CO2 29  < > 27  GLUCOSE 117*  < > 97  BUN 8  < > 11  CREATININE 0.68  < > 0.44  CALCIUM 8.5*  < > 8.0*  MG 1.8  --   --   < > = values in this interval not displayed.   Microbiology Results  Results for orders placed or performed during the hospital encounter of 09/23/15  Surgical PCR  screen     Status: None   Collection Time: 09/24/15 12:28 AM  Result Value Ref Range Status   MRSA, PCR NEGATIVE NEGATIVE Final   Staphylococcus aureus NEGATIVE NEGATIVE Final    Comment:        The Xpert SA Assay (FDA approved for NASAL specimens in patients over 51 years of age), is one component of a comprehensive surveillance program.  Test performance has been validated by [facility] for patients greater than or equal to 47 year old. It is not intended to diagnose infection nor to guide or monitor treatment.   Body fluid culture     Status: None (Preliminary result)   Collection Time: 09/25/15  3:46 PM  Result Value Ref Range Status   Specimen Description SYNOVIAL  Final   Special Requests Normal  Final   Gram Stain   Final    ABUNDANT WBC PRESENT, PREDOMINANTLY PMN NO ORGANISMS SEEN    Culture   Final    NO GROWTH 2 DAYS NO ANAEROBES ISOLATED; CULTURE IN PROGRESS FOR 5 DAYS Performed at [facility]    Report Status PENDING   Incomplete    RADIOLOGY:  No results found.   Management plans discussed with the patient, family and they are in agreement.  CODE STATUS:  Code Status History    Date Active Date Inactive Code Status Order ID Comments User Context   09/23/2015 11:23 PM 09/25/2015  4:49 PM Full Code [id number]  Oralia Manis, MD ED   09/23/2015 10:21 PM 09/23/2015 11:23 PM Full Code [id number]  Juanell Fairly, MD ED      TOTAL TIME TAKING CARE OF THIS PATIENT: 35 minutes.    Shaune Pollack M.D on 09/28/2015 at 9:02 AM  Between 7am to 6pm - Pager - [phone]  After 6pm go to [email or web address] - Social research officer, government  [facility] [place] [facility]  Office  [phone]  CC: Primary care physician; Marisue Ivan, MD   Note: This dictation was prepared with Dragon dictation along with smaller phrase technology. Any transcriptional errors that result from this process are unintentional.

## 2015-09-28 NOTE — Care Management Important Message (Signed)
Important Message  Patient Details  Name: Yolanda Cantu MRN: [id number] Date of Birth: 01/07/1926   Medicare Important Message Given:  Yes    Adonis HugueninBerkhead, Silviano Neuser L, RN 09/28/2015, 8:29 AM

## 2015-09-28 NOTE — Progress Notes (Signed)
Patient was bladder scanned and showed 175 ml of urine in bladder. I will bladder scan her later on this morning. Patient was c /o constipation to me as well. Patient was given mag. citrate and still has not had a BM at this time, will continue to monitor.

## 2015-09-28 NOTE — Progress Notes (Addendum)
Pt reports feeling tired, short of breath. VS taken, o2 sat 92-95% on room air, pulse 82. MD notified. No new orders at this time.

## 2015-09-28 NOTE — Progress Notes (Signed)
Physical Therapy Treatment Patient Details Name: Yolanda Cantu MRN: [id number] DOB: 06/29/1925 Today's Date: 09/28/2015    History of Present Illness Pt is an 82 y.o. female s/p fall slipping on steps and then fell in kitchen landing on R hip.  Pt sustained a R femoral neck fx and R large knee effusion.  Pt s/p R intertrochanteric IMN 8/27 and s/p R knee aspiration in PACU post-op.  PMH significant for a-fib on Coumadin, DM, htn, L TKA 2003, lower dentures, R distal radius fx and ulnar styloid fx November 2016.    PT Comments    Pt in bed fatigued but agrees to supine exercises as described below.  She declined out of bed at this time.  Discussed with nursing and EMS has been called for transport so it was deferred.    Follow Up Recommendations  SNF     Equipment Recommendations       Recommendations for Other Services       Precautions / Restrictions Precautions Precautions: Fall Restrictions Weight Bearing Restrictions: Yes RLE Weight Bearing: Partial weight bearing RLE Partial Weight Bearing Percentage or Pounds: PWB R LE (50%) with walker    Mobility  Bed Mobility               General bed mobility comments: not tested  Transfers                 General transfer comment: no tested - awaiting EMS transer to SNF  Ambulation/Gait                 Stairs            Wheelchair Mobility    Modified Rankin (Stroke Patients Only)       Balance                                    Cognition Arousal/Alertness: Lethargic Behavior During Therapy: Flat affect Overall Cognitive Status: Within Functional Limits for tasks assessed                      Exercises Total Joint Exercises Ankle Circles/Pumps: AROM;Both;20 reps;Supine Quad Sets: AROM;Both;20 reps;Supine Short Arc Quad: AROM;Right;20 reps;Supine Heel Slides: AAROM;Right;20 reps;Supine Hip ABduction/ADduction: AAROM;Right;20 reps;Supine    General Comments         Pertinent Vitals/Pain Pain Assessment: 0-10 Pain Score: 8  Pain Location: RLE Pain Descriptors / Indicators: Constant;Aching    Home Living                      Prior Function            PT Goals (current goals can now be found in the care plan section) Progress towards PT goals: Not progressing toward goals - comment    Frequency  BID    PT Plan Current plan remains appropriate    Co-evaluation             End of Session Equipment Utilized During Treatment: Oxygen Activity Tolerance: Patient limited by lethargy Patient left: in bed;with call bell/phone within reach;with bed alarm set     Time: 1025-1041 PT Time Calculation (min) (ACUTE ONLY): 16 min  Charges:  $Therapeutic Exercise: 8-22 mins                    G Codes:      Yolanda Cantu 09/28/2015, 11:15 AM

## 2015-09-28 NOTE — Progress Notes (Signed)
Updated report given to nurse at [facility], will contiue pt on 1L O2 and bladder scan in/out cath as needed. EMS called for transport.

## 2015-09-28 NOTE — Progress Notes (Signed)
Pt has continued to have issues with urinary retention since foley cath removal after surgery. Bladder scan and In/out cath as needed, 3 occurrences have been needed since 8/29. Pt stated she had this issue the last time she had surgery, was able to resolve on own after in/out cath several times. MD aware, discussed foley cath, per Dr. Imogene Burnhen prefer to avoid foley as this can be source of infection. Recommend continue bladder scan and cath PRN, monitor for urine output. Rep[facility] to [facility], pt is discharging to SNF today for rehab.

## 2015-09-28 NOTE — Clinical Social Work Placement (Signed)
   CLINICAL SOCIAL WORK PLACEMENT  NOTE  Date:  09/28/2015  Patient Details  Name: Yolanda Cantu MRN: [id number] Date of Birth: 06/04/1925  Clinical Social Work is seeking post-discharge placement for this patient at the Skilled  Nursing Facility level of care (*CSW will initial, date and re-position this form in  chart as items are completed):  Yes   Patient/family provided with [facility] Clinical Social Work Department's list of facilities offering this level of care within the geographic area requested by the patient (or if unable, by the patient's family).  Yes   Patient/family informed of their freedom to choose among providers that offer the needed level of care, that participate in Medicare, Medicaid or managed care program needed by the patient, have an available bed and are willing to accept the patient.  Yes   Patient/family informed of 's ownership interest in [facility] and [facility], as well as of the fact that they are under no obligation to receive care at these facilities.  PASRR submitted to EDS on 09/26/15     PASRR number received on 09/26/15     Existing PASRR number confirmed on       FL2 transmitted to all facilities in geographic area requested by pt/family on 09/26/15     FL2 transmitted to all facilities within larger geographic area on       Patient informed that his/her managed care company has contracts with or will negotiate with certain facilities, including the following:        Yes   Patient/family informed of bed offers received.  Patient chooses bed at  [facility] )     Physician recommends and patient chooses bed at      Patient to be transferred to  [facility] ) on 09/28/15.  Patient to be transferred to facility by  [facility] )     Patient family notified on 09/28/15 of transfer.  Name of family member notified:   (Patient asked CSW to not call anybody and that she has notifed her family and friends of her  D/C today. )     PHYSICIAN       Additional Comment:    _______________________________________________ Gerry Blanchfield, Darleen CrockerBailey M, LCSW 09/28/2015, 9:36 AM

## 2015-09-28 NOTE — Progress Notes (Signed)
Spoke with Yolanda Cantu, [employer] rep at [phone], to notify of non-emergent EMS transport.  Auth notification reference given as [id number].   Service date range good from 09/28/15 - 12/27/15.   Geographical gap exception requested to determine if services can be considered at an in-network level.

## 2015-09-28 NOTE — Progress Notes (Signed)
PT at bedside, pt laying supine while doing bed exercises, O2 checked 87%, pt mildly SOB. Applied O2 at 2L PRN. Will [facility].

## 2015-09-28 NOTE — Care Management (Signed)
Patient will discharge to SNF [facility] today , Signed off.

## 2015-09-28 NOTE — Progress Notes (Signed)
Patient is medically stable for D/C to [facility] today. [facility] patient will go to room E-14. RN will call report and arrange EMS for transport. Clinical Child psychotherapistocial Worker (CSW) sent [facility] orders to [facility] via [employer]. Patient is aware of above. Patient asked CSW to not call anybody and reported that she has made her friends and family aware of her D/C today. Please reconsult if future social work needs arise. CSW signing off.   Baker Hughes IncorporatedBailey Aztlan Coll, LCSW [phone]

## 2015-09-28 NOTE — Consult Note (Signed)
ANTICOAGULATION CONSULT NOTE - Follow up Consult  Pharmacy Consult for warfarin Indication: atrial fibrillation and VTE prophylaxis  No Active Allergies  Patient Measurements: Height: 5\' 6"  (167.6 cm) Weight: 172 lb 14.4 oz (78.4 kg) IBW/kg (Calculated) : 59.3  Vital Signs: Temp: 97.9 F (36.6 C) (08/30 0827) Temp Source: Oral (08/30 0827) BP: 110/58 (08/30 0827) Pulse Rate: 78 (08/30 0827)  Labs:  Recent Labs  09/26/15 0439 09/26/15 0442 09/27/15 0509 09/28/15 0523 09/28/15 0653  HGB 9.8*  --  10.0*  --  10.0*  HCT 29.5*  --  31.0*  --  30.0*  PLT 225  --  252  --  296  LABPROT  --  14.7 19.8* 22.3*  --   INR  --  1.14 1.66 1.93  --   CREATININE 0.42*  --  0.44  --   --     Estimated Creatinine Clearance: 51.3 mL/min (by C-G formula based on SCr of 0.8 mg/dL).   Medical History: Past Medical History:  Diagnosis Date  . Arthritis    knees  . Atrial fibrillation (HCC)   . Diabetes mellitus without complication (HCC)   . Hypercholesteremia   . Hypertension   . Hypothyroidism   . Thyroid disease   . Wears dentures    partial lower    Assessment: Pt is a 82 year old female with a PMH of afib s/p hip surgery. Pt is on warfarin at home. Home dose is 2 mg daily. Patient received 2 doses of IV vit K prior to surgery.   8/27 INR 1.13 - warfarin 5 mg 8/28 INR 1.14 - warfarin 5 mg 8/29 INR 1.66 - warfarin 2 mg 8/30 INR 1.93   Goal of Therapy:  INR 2-3 Monitor platelets by anticoagulation protocol: Yes   Plan:  INR trending up, close to therapeutic. Will order warfarin 2 mg (home dose) for tonight in case pt still here. Will continue to check daily INRs.    Pharmacy will continue to monitor and adjust per consult.    Crist FatWang, Shamiracle Gorden L 09/28/2015,10:13 AM

## 2015-10-25 ENCOUNTER — Ambulatory Visit (INDEPENDENT_AMBULATORY_CARE_PROVIDER_SITE_OTHER): Payer: Medicare Other | Admitting: Urology

## 2015-10-25 ENCOUNTER — Encounter: Payer: Self-pay | Admitting: Urology

## 2015-10-25 VITALS — BP 118/69 | HR 61 | Ht 66.0 in | Wt 165.0 lb

## 2015-10-25 DIAGNOSIS — R32 Unspecified urinary incontinence: Secondary | ICD-10-CM

## 2015-10-25 DIAGNOSIS — R339 Retention of urine, unspecified: Secondary | ICD-10-CM | POA: Diagnosis not present

## 2015-10-25 LAB — BLADDER SCAN AMB NON-IMAGING

## 2015-10-25 NOTE — Progress Notes (Signed)
10/25/2015 11:51 AM   Yolanda Cantu 04/21/1925 [id number]  Referring provider: Marisue IvanKanhka Linthavong, MD [phone] [facility] [place] , [place] [id number]  Chief Complaint  Patient presents with  . Urinary Incontinence    New Patient    HPI: Patient is an 82 year old Caucasian female who presents today as a referral for incontinence by Dr. Burnadette PopLinthavong.  She states she suffered a right hip fracture and had a Foley placed at that time.  It was then discontinued either prior to discharge or at the rehabilitation center.  She states they tried CIC x 2, but they were unsuccessful at the rehab center.  A Foley was never replaced.   Since her discharge from the rehab center, she has been complaining of frequency of urination, urgency, nocturia x 2 and incontinence.  She denies dysuria, gross  hematuria and suprapubic pain.  She is not experiencing fevers, chills, nausea or vomiting.   She states she was not having difficulty with urination prior to the hip fracture for the exception of the nocturia.  Her PVR at today's visit was 999 mL.  Her serum creatinine was 0.7 on 09/20/2015.    She is not very ambulatory at this time, but she is working with PT.   PMH: Past Medical History:  Diagnosis Date  . Arthritis    knees  . Atrial fibrillation (HCC)   . Diabetes mellitus without complication (HCC)   . Hypercholesteremia   . Hypertension   . Hypothyroidism   . Thyroid disease   . Wears dentures    partial lower    Surgical History: Past Surgical History:  Procedure Laterality Date  . ABDOMINAL HYSTERECTOMY    . APPENDECTOMY    . CATARACT EXTRACTION W/PHACO Left 05/09/2015   Procedure: CATARACT EXTRACTION PHACO AND INTRAOCULAR LENS PLACEMENT (IOC) left eye;  Surgeon: Sherald HessAnita Prakash Vin-Parikh, MD;  [facility];  Service: Ophthalmology;  Laterality: Left;  DIABETIC - oral meds per pt 8 or after for arrival time  . CHOLECYSTECTOMY    . INTRAMEDULLARY  (IM) NAIL INTERTROCHANTERIC Right 09/25/2015   Procedure: INTRAMEDULLARY (IM) NAIL INTERTROCHANTRIC;  Surgeon: Juanell FairlyKevin Krasinski, MD;  Location: [facility];  Service: Orthopedics;  Laterality: Right;  . SPLENECTOMY    . SPLENECTOMY    . TONSILLECTOMY    . TOTAL KNEE ARTHROPLASTY Left     Home Medications:    Medication List       Accurate as of 10/25/15 11:51 AM. Always use your most recent med list.          acetaminophen 500 MG tablet Commonly known as:  TYLENOL Take 500 mg by mouth every 6 (six) hours as needed.   atorvastatin 40 MG tablet Commonly known as:  LIPITOR Take 20 mg by mouth daily.   diltiazem 240 MG 24 hr capsule Commonly known as:  TIAZAC Take 240 mg by mouth daily.   furosemide 40 MG tablet Commonly known as:  LASIX Take by mouth.   latanoprost 0.005 % ophthalmic solution Commonly known as:  XALATAN Place 1 drop into both eyes at bedtime.   levothyroxine 125 MCG tablet Commonly known as:  SYNTHROID, LEVOTHROID Take 125 mcg by mouth daily before breakfast.   metFORMIN 500 MG tablet Commonly known as:  GLUCOPHAGE Take 250 mg by mouth 2 (two) times daily with a meal.   timolol 0.5 % ophthalmic solution Commonly known as:  BETIMOL Place 1 drop into both eyes daily.   warfarin 2  MG tablet Commonly known as:  COUMADIN Take 2 mg by mouth daily.       Allergies: No Known Allergies  Family History: Family History  Problem Relation Age of Onset  . Diabetes Father   . Stroke Father   . Lung cancer Brother   . Heart disease Mother   . Lung cancer Sister   . Prostate cancer Neg Hx   . Kidney cancer Neg Hx     Social History:  reports that she has never smoked. She has never used smokeless tobacco. She reports that she does not drink alcohol or use drugs.  ROS: UROLOGY Frequent Urination?: Yes Hard to postpone urination?: Yes Burning/pain with urination?: No Get up at night to urinate?: No Leakage of urine?: Yes Urine stream starts and  stops?: No Trouble starting stream?: No Do you have to strain to urinate?: No Blood in urine?: No Urinary tract infection?: No Sexually transmitted disease?: No Injury to kidneys or bladder?: No Painful intercourse?: No Weak stream?: No Currently pregnant?: No Vaginal bleeding?: No Last menstrual period?: n  Gastrointestinal Nausea?: No Vomiting?: No Indigestion/heartburn?: No Diarrhea?: No Constipation?: No  Constitutional Fever: No Night sweats?: No Weight loss?: No Fatigue?: No  Skin Skin rash/lesions?: No Itching?: No  Eyes Blurred vision?: No Double vision?: No  Ears/Nose/Throat Sore throat?: No Sinus problems?: No  Hematologic/Lymphatic Swollen glands?: No Easy bruising?: No  Cardiovascular Leg swelling?: No Chest pain?: No  Respiratory Cough?: No Shortness of breath?: No  Endocrine Excessive thirst?: No  Musculoskeletal Back pain?: No Joint pain?: No  Neurological Headaches?: No Dizziness?: No  Psychologic Depression?: No Anxiety?: No  Physical Exam: BP 118/69   Pulse 61   Ht 5\' 6"  (1.676 m)   Wt 165 lb (74.8 kg)   BMI 26.63 kg/m   Constitutional: Well nourished. Alert and oriented, No acute distress. HEENT: [place] AT, moist mucus membranes. Trachea midline, no masses. Cardiovascular: No clubbing, cyanosis, or edema. Respiratory: Normal respiratory effort, no increased work of breathing. GI: Abdomen is soft, non tender, non distended, no abdominal masses. Liver and spleen not palpable.  No hernias appreciated.  Stool sample for occult testing is not indicated.   GU: No CVA tenderness.  Bladder fullness is noted.     Skin: No rashes, bruises or suspicious lesions. Lymph: No cervical or inguinal adenopathy. Neurologic: Grossly intact, no focal deficits, moving all 4 extremities. Psychiatric: Normal mood and affect.  Laboratory Data: Lab Results  Component Value Date   WBC 14.4 (H) 09/28/2015   HGB 10.0 (L) 09/28/2015   HCT  30.0 (L) 09/28/2015   MCV 91.7 09/28/2015   PLT 296 09/28/2015    Lab Results  Component Value Date   CREATININE 0.44 09/27/2015   Pertinent Imaging: Results for IFRAH, VEST (MRN [id number]) as of 10/29/2015 14:22  Ref. Range 10/25/2015 11:19  Scan Result Unknown >996ml    Assessment & Plan:    1. Urinary incontinence  - due to a distended bladder  - BLADDER SCAN AMB NON-IMAGING  - Foley placed at today's visit  - Will reassess when or if the bladder recovers  2. Urinary retention  - Foley placed at today's visit  - RTC in one week in the am for voiding trial- if voiding successfully- RTC in one month for PVR and symptom recheck  - patient and niece are not interested in learning CIC at this time  - if bladder does not recover, this may be a possible solution, will revisit if  necessary  - advised patient they may see blood in the Foley bag due to the micro tears in the bladder and the lack of tamponade effect once the bladder has been decompressed  - advised to contact the office if starts passing clots, Foley stops draining or blood in the urine becomes very thick  Return in about 1 week (around 11/01/2015) for voiding trial in the am.  These notes generated with voice recognition software. I apologize for typographical errors.  Michiel Cowboy, PA-C  [facility] [address] [place], [place] [address] [phone]

## 2015-10-25 NOTE — Progress Notes (Signed)
Simple Catheter Placement  Due to urinary retention patient is present today for a foley cath placement.  Patient was cleaned and prepped in a sterile fashion with betadine and lidocaine jelly 2% was instilled into the urethra.  A 16 FR foley catheter was inserted, urine return was noted  1000ml, urine was dark yellow in color.  The balloon was filled with 10cc of sterile water.  A leg bag was attached for drainage. Patient was also given a night bag to take home and was given instruction on how to change from one bag to another.  Patient was given instruction on proper catheter care.  Patient tolerated well, no complications were noted   Preformed by: Eligha BridegroomSarah Judyth Demarais, CMA  Additional notes/ Follow up: voiding trial

## 2015-10-31 ENCOUNTER — Ambulatory Visit: Payer: Self-pay | Admitting: Urology

## 2015-11-01 ENCOUNTER — Ambulatory Visit (INDEPENDENT_AMBULATORY_CARE_PROVIDER_SITE_OTHER): Payer: Medicare Other

## 2015-11-01 VITALS — BP 128/84 | HR 93 | Ht 62.0 in | Wt 158.5 lb

## 2015-11-01 DIAGNOSIS — R339 Retention of urine, unspecified: Secondary | ICD-10-CM

## 2015-11-01 LAB — BLADDER SCAN AMB NON-IMAGING: Scan Result: >750

## 2015-11-01 NOTE — Progress Notes (Signed)
Simple Catheter Placement  Due to urinary retention patient is present today for a foley cath placement.  Patient was cleaned and prepped in a sterile fashion with betadine and lidocaine jelly 2% was instilled into the urethra.  A 16 FR foley catheter was inserted, urine return was noted  800ml, urine was yellow in color.  The balloon was filled with 10cc of sterile water.  A leg bag was attached for drainage. Patient was also given a night bag to take home and was given instruction on how to change from one bag to another.  Patient was given instruction on proper catheter care.  Patient tolerated well, no complications were noted   Preformed by: Rupert Stackshelsea Katalyna Socarras, LPN   Additional notes/ Follow up: [facility] Pt came in this morning and had foley removed. Pt went home and drank water until 3 and was not able to urinate.   Blood pressure 128/84, pulse 93, height 5\' 2"  (1.575 m), weight 158 lb 8 oz (71.9 kg).

## 2015-12-01 ENCOUNTER — Ambulatory Visit (INDEPENDENT_AMBULATORY_CARE_PROVIDER_SITE_OTHER): Payer: Medicare Other | Admitting: Urology

## 2015-12-01 ENCOUNTER — Encounter: Payer: Self-pay | Admitting: Urology

## 2015-12-01 VITALS — BP 168/79 | HR 99 | Ht 65.0 in | Wt 154.8 lb

## 2015-12-01 DIAGNOSIS — R339 Retention of urine, unspecified: Secondary | ICD-10-CM | POA: Diagnosis not present

## 2015-12-01 DIAGNOSIS — N39490 Overflow incontinence: Secondary | ICD-10-CM | POA: Diagnosis not present

## 2015-12-01 NOTE — Progress Notes (Signed)
Simple Catheter Placement  Due to urinary retention patient is present today for a foley cath placement.  Patient was cleaned and prepped in a sterile fashion with betadine and lidocaine jelly 2% was instilled into the urethra.  A 16 FR foley catheter was inserted, urine return was noted  260 ml, urine was light yellow in color.  The balloon was filled with 10cc of sterile water.  A leg bag was attached for drainage. Patient was also given a night bag to take home and was given instruction on how to change from one bag to another.  Patient was given instruction on proper catheter care.  Patient tolerated well, with no complicatons  Preformed by: Theotis BurrowK.Leiloni Smithers,CMA

## 2015-12-01 NOTE — Progress Notes (Signed)
Fill and Pull Catheter Removal  Patient is present today for a catheter removal.  Patient was cleaned and prepped in a sterile fashion 300ml of sterile water/ saline was instilled into the bladder when the patient felt the urge to urinate. 9ml of water was then drained from the balloon.  A 16FR foley cath was removed from the bladder no complications were noted .  Patient as then given some time to void on their own.  Patient cannot void on their own after some time. 16Foley catheter replaced, Patient tolerated well.  Preformed by: Dallas Schimkeamona Deloria Brassfield CMA Replacement of Catheter by Laurel DimmerKelita Russell CMA  Follow up: One Month for voiding Trial

## 2015-12-01 NOTE — Progress Notes (Signed)
12/01/2015 11:30 AM   Yolanda Cantu 07/02/1925 [id number]  Referring provider: Marisue IvanKanhka Linthavong, MD [phone] [facility] [facility] [place], [place] [id number]  Chief Complaint  Patient presents with  . Urinary Retention    1 month follow up    HPI: Patient is an 82 year old Caucasian female who presents today for a one month follow up after failing a voiding trial.  Background history Patient presented today as a referral for incontinence by Dr. Burnadette PopLinthavong.  She states she suffered a right hip fracture in 08/2015 and had a Foley placed at that time.  It was then discontinued either prior to discharge or at the rehabilitation center.  She states they tried CIC x 2, but they were unsuccessful at the rehab center.  A Foley was never replaced.  She states she was not having difficulty with urination prior to the hip fracture for the exception of the nocturia.  Her PVR at initial visit was 999 mL.  Her serum creatinine was 0.7 on 09/20/2015.  A Foley was placed at that time.  She then returned one week later for a voiding trial and failed. A Foley catheter was replaced at that time.  Today, she has no complaints regarding her Foley.  She has not had blood in her urine, fevers, chills, nausea or vomiting.  She is now at home an ambulating with a walker.     PMH: Past Medical History:  Diagnosis Date  . Arthritis    knees  . Atrial fibrillation (HCC)   . Diabetes mellitus without complication (HCC)   . Hypercholesteremia   . Hypertension   . Hypothyroidism   . Thyroid disease   . Wears dentures    partial lower    Surgical History: Past Surgical History:  Procedure Laterality Date  . ABDOMINAL HYSTERECTOMY    . APPENDECTOMY    . CATARACT EXTRACTION W/PHACO Left 05/09/2015   Procedure: CATARACT EXTRACTION PHACO AND INTRAOCULAR LENS PLACEMENT (IOC) left eye;  Surgeon: Sherald HessAnita Prakash Vin-Parikh, MD;  [facility];  Service: Ophthalmology;  Laterality:  Left;  DIABETIC - oral meds per pt 8 or after for arrival time  . CHOLECYSTECTOMY    . INTRAMEDULLARY (IM) NAIL INTERTROCHANTERIC Right 09/25/2015   Procedure: INTRAMEDULLARY (IM) NAIL INTERTROCHANTRIC;  Surgeon: Juanell FairlyKevin Krasinski, MD;  Location: [facility];  Service: Orthopedics;  Laterality: Right;  . SPLENECTOMY    . SPLENECTOMY    . TONSILLECTOMY    . TOTAL KNEE ARTHROPLASTY Left     Home Medications:    Medication List       Accurate as of 12/01/15 11:30 AM. Always use your most recent med list.          acetaminophen 500 MG tablet Commonly known as:  TYLENOL Take 500 mg by mouth every 6 (six) hours as needed.   atorvastatin 40 MG tablet Commonly known as:  LIPITOR Take 20 mg by mouth daily.   diltiazem 240 MG 24 hr capsule Commonly known as:  TIAZAC Take 240 mg by mouth daily.   furosemide 40 MG tablet Commonly known as:  LASIX Take by mouth.   latanoprost 0.005 % ophthalmic solution Commonly known as:  XALATAN Place 1 drop into both eyes at bedtime.   levothyroxine 125 MCG tablet Commonly known as:  SYNTHROID, LEVOTHROID Take 125 mcg by mouth daily before breakfast.   metFORMIN 500 MG tablet Commonly known as:  GLUCOPHAGE Take 250 mg by mouth 2 (two) times daily with  a meal.   timolol 0.5 % ophthalmic solution Commonly known as:  BETIMOL Place 1 drop into both eyes daily.   warfarin 2 MG tablet Commonly known as:  COUMADIN Take 2 mg by mouth daily.       Allergies: No Known Allergies  Family History: Family History  Problem Relation Age of Onset  . Diabetes Father   . Stroke Father   . Lung cancer Brother   . Heart disease Mother   . Lung cancer Sister   . Prostate cancer Neg Hx   . Kidney cancer Neg Hx     Social History:  reports that she has never smoked. She has never used smokeless tobacco. She reports that she does not drink alcohol or use drugs.  ROS: UROLOGY Frequent Urination?: Yes Hard to postpone urination?: Yes Burning/pain  with urination?: No Get up at night to urinate?: Yes Leakage of urine?: Yes Urine stream starts and stops?: No Trouble starting stream?: No Do you have to strain to urinate?: No Blood in urine?: No Urinary tract infection?: No Sexually transmitted disease?: No Injury to kidneys or bladder?: No Painful intercourse?: No Weak stream?: No Currently pregnant?: No Vaginal bleeding?: No Last menstrual period?: n  Gastrointestinal Nausea?: No Vomiting?: No Indigestion/heartburn?: No Diarrhea?: No Constipation?: No  Constitutional Fever: No Night sweats?: No Weight loss?: Yes Fatigue?: No  Skin Skin rash/lesions?: No Itching?: No  Eyes Blurred vision?: No Double vision?: No  Ears/Nose/Throat Sore throat?: No Sinus problems?: No  Hematologic/Lymphatic Swollen glands?: No Easy bruising?: Yes  Cardiovascular Leg swelling?: No Chest pain?: No  Respiratory Cough?: No Shortness of breath?: No  Endocrine Excessive thirst?: No  Musculoskeletal Back pain?: No Joint pain?: No  Neurological Headaches?: No Dizziness?: No  Psychologic Depression?: No Anxiety?: No  Physical Exam: BP (!) 168/79   Pulse 99   Ht 5\' 5"  (1.651 m)   Wt 154 lb 12.8 oz (70.2 kg)   BMI 25.76 kg/m   Constitutional: Well nourished. Alert and oriented, No acute distress. HEENT: [place] AT, moist mucus membranes. Trachea midline, no masses. Cardiovascular: No clubbing, cyanosis, or edema. Respiratory: Normal respiratory effort, no increased work of breathing. Skin: No rashes, bruises or suspicious lesions. Lymph: No cervical or inguinal adenopathy. Neurologic: Grossly intact, no focal deficits, moving all 4 extremities. Psychiatric: Normal mood and affect.  Laboratory Data: Lab Results  Component Value Date   WBC 14.4 (H) 09/28/2015   HGB 10.0 (L) 09/28/2015   HCT 30.0 (L) 09/28/2015   MCV 91.7 09/28/2015   PLT 296 09/28/2015    Lab Results  Component Value Date   CREATININE  0.44 09/27/2015    Assessment & Plan:    1. Urinary retention  - Patient failed her fill and pull at today's visit  - Foley replaced at today's visit  - We discussed learning CIC, management with an indwelling Foley or a SPT placement  - She would like to continue with the indwelling Foley at this time, explained that it could increase her risk for infection, it will need to be changed monthly and that she would need surveillance cystoscopy on a yearly basis to evaluate for a possible squamous cell carcinoma  2. Overflow urinary incontinence  - managed with indwelling Foley   Return in about 1 month (around 12/31/2015) for Foley catheter exchange on nurses schedule.  These notes generated with voice recognition software. I apologize for typographical errors.  Michiel CowboySHANNON Shamikia Linskey, PA-C  [facility] [address], [address] [place],  [place] [phone]

## 2015-12-30 ENCOUNTER — Ambulatory Visit (INDEPENDENT_AMBULATORY_CARE_PROVIDER_SITE_OTHER): Payer: Medicare Other | Admitting: *Deleted

## 2015-12-30 DIAGNOSIS — R339 Retention of urine, unspecified: Secondary | ICD-10-CM | POA: Diagnosis not present

## 2015-12-30 NOTE — Progress Notes (Signed)
Cath Change/ Replacement  Patient is present today for a catheter change due to urinary retention.  8ml of water was removed from the balloon, a 16FR foley cath was removed with out difficulty.  Patient was cleaned and prepped in a sterile fashion with betadine and 2% lidocaine jelly was instilled into the urethra. A 16FR foley cath was replaced into the bladder no complications were noted Urine return was noted 10ml and urine was yellow in color. The balloon was filled with 10ml of sterile water. A leg bag was attached for drainage.  A night bag was also given to the patient and patient was given instruction on how to change from one bag to another. Patient was given proper instruction on catheter care.    Preformed by: Dallas Schimkeamona Williams CMA  Follow up: One Month

## 2016-01-16 DIAGNOSIS — Z7185 Encounter for immunization safety counseling: Secondary | ICD-10-CM | POA: Insufficient documentation

## 2016-01-16 DIAGNOSIS — Z0000 Encounter for general adult medical examination without abnormal findings: Secondary | ICD-10-CM | POA: Insufficient documentation

## 2016-01-16 DIAGNOSIS — Z7189 Other specified counseling: Secondary | ICD-10-CM | POA: Insufficient documentation

## 2016-02-01 ENCOUNTER — Ambulatory Visit (INDEPENDENT_AMBULATORY_CARE_PROVIDER_SITE_OTHER): Payer: Medicare Other

## 2016-02-01 VITALS — BP 170/72 | HR 64 | Ht 64.0 in | Wt 150.6 lb

## 2016-02-01 DIAGNOSIS — R339 Retention of urine, unspecified: Secondary | ICD-10-CM | POA: Diagnosis not present

## 2016-02-01 NOTE — Progress Notes (Signed)
Cath Change/ Replacement  Patient is present today for a catheter change due to urinary retention.  10ml of water was removed from the balloon, a 16FR foley cath was removed with out difficulty.  Patient was cleaned and prepped in a sterile fashion with betadine and 2% lidocaine jelly was instilled into the urethra. A 16 FR foley cath was replaced into the bladder no complications were noted Urine return was noted 20ml and urine was yellow in color. The balloon was filled with 10ml of sterile water. A leg bag was attached for drainage.  A night bag was also given to the patient and patient was given instruction on how to change from one bag to another. Patient was given proper instruction on catheter care.    Preformed by: Rupert Stackshelsea Watkins, LPN   Blood pressure (!) 170/72, pulse 64, height 5\' 4"  (1.626 m), weight 150 lb 9.6 oz (68.3 kg).

## 2016-03-01 ENCOUNTER — Ambulatory Visit (INDEPENDENT_AMBULATORY_CARE_PROVIDER_SITE_OTHER): Payer: Medicare Other

## 2016-03-01 VITALS — BP 165/99 | HR 86 | Ht 65.0 in | Wt 150.4 lb

## 2016-03-01 DIAGNOSIS — R339 Retention of urine, unspecified: Secondary | ICD-10-CM | POA: Diagnosis not present

## 2016-03-01 NOTE — Progress Notes (Signed)
Cath Change/ Replacement  Patient is present today for a catheter change due to urinary retention.  10ml of water was removed from the balloon, a 16FR foley cath was removed with out difficulty.  Patient was cleaned and prepped in a sterile fashion with betadine and 2% lidocaine jelly was instilled into the urethra. A 16 FR foley cath was replaced into the bladder no complications were noted Urine return was noted 50ml and urine was yellow in color. The balloon was filled with 10ml of sterile water. A leg bag was attached for drainage.  A night bag was also given to the patient and patient was given instruction on how to change from one bag to another. Patient was given proper instruction on catheter care.    Preformed by: Rupert Stackshelsea Watkins, LPN   Follow up: pt will see shannon next month  Blood pressure (!) 165/99, pulse 86, height 5\' 5"  (1.651 m), weight 150 lb 6.4 oz (68.2 kg).

## 2016-03-29 ENCOUNTER — Ambulatory Visit: Payer: Medicare Other | Admitting: Urology

## 2016-03-29 ENCOUNTER — Encounter: Payer: Self-pay | Admitting: Urology

## 2016-03-29 VITALS — BP 151/89 | HR 98 | Ht 66.0 in | Wt 151.7 lb

## 2016-03-29 DIAGNOSIS — N39490 Overflow incontinence: Secondary | ICD-10-CM | POA: Diagnosis not present

## 2016-03-29 DIAGNOSIS — R339 Retention of urine, unspecified: Secondary | ICD-10-CM

## 2016-03-29 NOTE — Progress Notes (Signed)
Cath Change/ Replacement  Patient is present today for a catheter change due to urinary retention.  9ml of water was removed from the balloon, a 16FR foley cath was removed with out difficulty.  Patient was cleaned and prepped in a sterile fashion with betadine and 2% lidocaine jelly was instilled into the urethra. A 16 FR foley cath was replaced into the bladder no complications were noted. Urine return was noted 1ml and urine was clear in color. The balloon was filled with 10ml of sterile water. A leg bag was attached for drainage.  A night bag was also given to the patient and patient was given instruction on how to change from one bag to another. Patient was given proper instruction on catheter care.    Preformed by: Dallas Schimkeamona Janessa Mickle CMA  Follow up: One Month

## 2016-03-29 NOTE — Progress Notes (Signed)
03/29/2016 11:28 AM   Yolanda Cantu 07/28/1925 [id number]  Referring provider: Marisue IvanKanhka Linthavong, MD [phone] [facility] [address] [facility] [place], [place] [id number]  Chief Complaint  Patient presents with  . Follow-up    Foley exchange    HPI: 83 yo WF who presents today for a monthly catheter exchange.  Background history Patient is an 83 year old Caucasian female who presents today as a referral for incontinence by Dr. Burnadette PopLinthavong.  She states she suffered a right hip fracture and had a Foley placed at that time.  It was then discontinued either prior to discharge or at the rehabilitation center.  She states they tried CIC x 2, but they were unsuccessful at the rehab center.  A Foley was never replaced.  Since her discharge from the rehab center, she has been complaining of frequency of urination, urgency, nocturia x 2 and incontinence.  She denies dysuria, gross  hematuria and suprapubic pain.  She is not experiencing fevers, chills, nausea or vomiting.  She states she was not having difficulty with urination prior to the hip fracture for the exception of the nocturia.  Her PVR at today's visit was 999 mL.  Her serum creatinine was 0.7 on 09/20/2015.  She is not very ambulatory at this time, but she is working with PT.  Today, she is having no complaints.   She is having no problems with her Foley at this time.  She has not had recent fevers, chills, nausea or vomiting.  She has not gross hematuria, suprapubic pain or dysuria.    Foley is exchanged without difficulty.      PMH: Past Medical History:  Diagnosis Date  . Arthritis    knees  . Atrial fibrillation (HCC)   . Diabetes mellitus without complication (HCC)   . Hypercholesteremia   . Hypertension   . Hypothyroidism   . Thyroid disease   . Wears dentures    partial lower    Surgical History: Past Surgical History:  Procedure Laterality Date  . ABDOMINAL HYSTERECTOMY    . APPENDECTOMY    . CATARACT EXTRACTION  W/PHACO Left 05/09/2015   Procedure: CATARACT EXTRACTION PHACO AND INTRAOCULAR LENS PLACEMENT (IOC) left eye;  Surgeon: Sherald HessAnita Prakash Vin-Parikh, MD;  [facility];  Service: Ophthalmology;  Laterality: Left;  DIABETIC - oral meds per pt 8 or after for arrival time  . CHOLECYSTECTOMY    . INTRAMEDULLARY (IM) NAIL INTERTROCHANTERIC Right 09/25/2015   Procedure: INTRAMEDULLARY (IM) NAIL INTERTROCHANTRIC;  Surgeon: Juanell FairlyKevin Krasinski, MD;  Location: [facility];  Service: Orthopedics;  Laterality: Right;  . SPLENECTOMY    . SPLENECTOMY    . TONSILLECTOMY    . TOTAL KNEE ARTHROPLASTY Left     Home Medications:  Allergies as of 03/29/2016   No Known Allergies     Medication List       Accurate as of 03/29/16 11:28 AM. Always use your most recent med list.          acetaminophen 500 MG tablet Commonly known as:  TYLENOL Take 500 mg by mouth every 6 (six) hours as needed.   atorvastatin 40 MG tablet Commonly known as:  LIPITOR Take 20 mg by mouth daily.   cyanocobalamin 1000 MCG/ML injection Commonly known as:  (VITAMIN B-12) Inject into the muscle.   diltiazem 240 MG 24 hr capsule Commonly known as:  TIAZAC Take 240 mg by mouth daily.   diltiazem 240 MG 24 hr capsule Commonly known as:  CARDIZEM CD TAKE 1 CAPSULE BY MOUTH ONCE A DAY   ferrous sulfate 325 (65 FE) MG tablet Take by mouth.   furosemide 40 MG tablet Commonly known as:  LASIX Take by mouth.   latanoprost 0.005 % ophthalmic solution Commonly known as:  XALATAN Place 1 drop into both eyes at bedtime.   levothyroxine 125 MCG tablet Commonly known as:  SYNTHROID, LEVOTHROID Take 125 mcg by mouth daily before breakfast.   metFORMIN 500 MG tablet Commonly known as:  GLUCOPHAGE Take 250 mg by mouth 2 (two) times daily with a meal.   senna 8.6 MG tablet Commonly known as:  SENOKOT Take by mouth.   timolol 0.5 % ophthalmic solution Commonly known as:  BETIMOL Place 1 drop into both eyes daily.     warfarin 2 MG tablet Commonly known as:  COUMADIN Take 2 mg by mouth daily.       Allergies: No Known Allergies  Family History: Family History  Problem Relation Age of Onset  . Diabetes Father   . Stroke Father   . Lung cancer Brother   . Heart disease Mother   . Lung cancer Sister   . Prostate cancer Neg Hx   . Kidney cancer Neg Hx   . Bladder Cancer Neg Hx     Social History:  reports that she has never smoked. She has never used smokeless tobacco. She reports that she does not drink alcohol or use drugs.  ROS: UROLOGY Frequent Urination?: No Hard to postpone urination?: No Burning/pain with urination?: No Get up at night to urinate?: No Leakage of urine?: No Urine stream starts and stops?: No Trouble starting stream?: No Do you have to strain to urinate?: No Blood in urine?: No Urinary tract infection?: No Sexually transmitted disease?: No Injury to kidneys or bladder?: No Painful intercourse?: No Weak stream?: No Currently pregnant?: No Vaginal bleeding?: No Last menstrual period?: n  Gastrointestinal Nausea?: No Vomiting?: No Indigestion/heartburn?: No Diarrhea?: No Constipation?: No  Constitutional Fever: No Night sweats?: No Weight loss?: No Fatigue?: No  Skin Skin rash/lesions?: No Itching?: No  Eyes Blurred vision?: No Double vision?: No  Ears/Nose/Throat Sore throat?: No Sinus problems?: No  Hematologic/Lymphatic Swollen glands?: No Easy bruising?: No  Cardiovascular Leg swelling?: No Chest pain?: No  Respiratory Cough?: No Shortness of breath?: No  Endocrine Excessive thirst?: No  Musculoskeletal Back pain?: No Joint pain?: No  Neurological Headaches?: No Dizziness?: No  Psychologic Depression?: No Anxiety?: No  Physical Exam: BP (!) 151/89   Pulse 98   Ht 5\' 6"  (1.676 m)   Wt 151 lb 11.2 oz (68.8 kg)   BMI 24.49 kg/m   Constitutional: Well nourished. Alert and oriented, No acute distress. HEENT: [place]  AT, moist mucus membranes. Trachea midline, no masses. Cardiovascular: No clubbing, cyanosis, or edema. Respiratory: Normal respiratory effort, no increased work of breathing. GI: Abdomen is soft, non tender, non distended, no abdominal masses. Liver and spleen not palpable.  No hernias appreciated.  Stool sample for occult testing is not indicated.   GU: No CVA tenderness.  Bladder fullness is noted.     Skin: No rashes, bruises or suspicious lesions. Lymph: No cervical or inguinal adenopathy. Neurologic: Grossly intact, no focal deficits, moving all 4 extremities. Psychiatric: Normal mood and affect.  Laboratory Data: Lab Results  Component Value Date   WBC 14.4 (H) 09/28/2015   HGB 10.0 (L) 09/28/2015   HCT 30.0 (L) 09/28/2015   MCV 91.7 09/28/2015   PLT 296  09/28/2015    Lab Results  Component Value Date   CREATININE 0.44 09/27/2015    Assessment & Plan:    1. Urinary incontinence  - due to overflow  - managed with indwelling Foley  2. Urinary retention  - Foley exchanged at this visit  - cystoscopy will need to be performed in 09/2016   Return in about 1 month (around 04/29/2016) for Foley catheter exchange.  These notes generated with voice recognition software. I apologize for typographical errors.  Michiel Cowboy, PA-C  [facility] [address] [place], [place] [address] [phone]

## 2016-04-17 ENCOUNTER — Emergency Department
Admission: EM | Admit: 2016-04-17 | Discharge: 2016-04-17 | Disposition: A | Discharge status: Home / Self Care | Payer: Medicare Other | Attending: Emergency Medicine | Admitting: Emergency Medicine

## 2016-04-17 DIAGNOSIS — Z7901 Long term (current) use of anticoagulants: Secondary | ICD-10-CM | POA: Insufficient documentation

## 2016-04-17 DIAGNOSIS — Z7984 Long term (current) use of oral hypoglycemic drugs: Secondary | ICD-10-CM | POA: Insufficient documentation

## 2016-04-17 DIAGNOSIS — R339 Retention of urine, unspecified: Secondary | ICD-10-CM | POA: Diagnosis present

## 2016-04-17 DIAGNOSIS — T83518A Infection and inflammatory reaction due to other urinary catheter, initial encounter: Secondary | ICD-10-CM | POA: Insufficient documentation

## 2016-04-17 DIAGNOSIS — I10 Essential (primary) hypertension: Secondary | ICD-10-CM | POA: Diagnosis not present

## 2016-04-17 DIAGNOSIS — Y828 Other medical devices associated with adverse incidents: Secondary | ICD-10-CM | POA: Diagnosis not present

## 2016-04-17 DIAGNOSIS — E039 Hypothyroidism, unspecified: Secondary | ICD-10-CM | POA: Diagnosis not present

## 2016-04-17 DIAGNOSIS — E119 Type 2 diabetes mellitus without complications: Secondary | ICD-10-CM | POA: Diagnosis not present

## 2016-04-17 DIAGNOSIS — Z79899 Other long term (current) drug therapy: Secondary | ICD-10-CM | POA: Diagnosis not present

## 2016-04-17 DIAGNOSIS — N390 Urinary tract infection, site not specified: Secondary | ICD-10-CM

## 2016-04-17 DIAGNOSIS — T83511A Infection and inflammatory reaction due to indwelling urethral catheter, initial encounter: Secondary | ICD-10-CM

## 2016-04-17 LAB — CBC WITH DIFFERENTIAL/PLATELET
Basophils Absolute: 0.1 10*3/uL (ref 0–0.1)
Basophils Relative: 1 %
EOS PCT: 2 %
Eosinophils Absolute: 0.2 10*3/uL (ref 0–0.7)
HCT: 37.3 % (ref 35.0–47.0)
HEMOGLOBIN: 12.2 g/dL (ref 12.0–16.0)
LYMPHS ABS: 1.6 10*3/uL (ref 1.0–3.6)
LYMPHS PCT: 24 %
MCH: 31.7 pg (ref 26.0–34.0)
MCHC: 32.9 g/dL (ref 32.0–36.0)
MCV: 96.6 fL (ref 80.0–100.0)
Monocytes Absolute: 0.6 10*3/uL (ref 0.2–0.9)
Monocytes Relative: 8 %
NEUTROS PCT: 65 %
Neutro Abs: 4.5 10*3/uL (ref 1.4–6.5)
Platelets: 334 10*3/uL (ref 150–440)
RBC: 3.86 MIL/uL (ref 3.80–5.20)
RDW: 15.8 % — ABNORMAL HIGH (ref 11.5–14.5)
WBC: 7 10*3/uL (ref 3.6–11.0)

## 2016-04-17 LAB — URINALYSIS, ROUTINE W REFLEX MICROSCOPIC
Bilirubin Urine: NEGATIVE
Glucose, UA: NEGATIVE mg/dL
KETONES UR: NEGATIVE mg/dL
NITRITE: NEGATIVE
PH: 6 (ref 5.0–8.0)
Protein, ur: 30 mg/dL — AB
Specific Gravity, Urine: 1.01 (ref 1.005–1.030)

## 2016-04-17 LAB — COMPREHENSIVE METABOLIC PANEL
ALT: 13 U/L — AB (ref 14–54)
AST: 23 U/L (ref 15–41)
Albumin: 3.9 g/dL (ref 3.5–5.0)
Alkaline Phosphatase: 63 U/L (ref 38–126)
Anion gap: 6 (ref 5–15)
BUN: 12 mg/dL (ref 6–20)
CALCIUM: 8.8 mg/dL — AB (ref 8.9–10.3)
CO2: 28 mmol/L (ref 22–32)
CREATININE: 0.56 mg/dL (ref 0.44–1.00)
Chloride: 104 mmol/L (ref 101–111)
Glucose, Bld: 104 mg/dL — ABNORMAL HIGH (ref 65–99)
Potassium: 3.6 mmol/L (ref 3.5–5.1)
Sodium: 138 mmol/L (ref 135–145)
TOTAL PROTEIN: 7.6 g/dL (ref 6.5–8.1)
Total Bilirubin: 0.6 mg/dL (ref 0.3–1.2)

## 2016-04-17 MED ORDER — CEPHALEXIN 500 MG PO CAPS: 500.0000 mg | ORAL_CAPSULE | Freq: Two times a day (BID) | ORAL | 0 refills | Status: AC

## 2016-04-17 NOTE — ED Triage Notes (Signed)
Pt in with co catheter displacement states "it came out". Noted this am that she is voiding around it and feels like it needs to be replaced. Has had it in place since September.

## 2016-04-17 NOTE — Discharge Instructions (Signed)
Take keflex twice daily for 5 days to prevent infection.   Get your INR checked tomorrow.   Make sure that your catheter drains well.   Recheck blood pressure with your doctor  See your urologist and primary care doctor  Return to ER if you have severe pain around catheter site, abdominal pain, vomiting, fever, catheter not draining or draining a lot of blood clots.

## 2016-04-17 NOTE — ED Notes (Signed)
Post foley bladder scan reads 0 mls

## 2016-04-17 NOTE — ED Notes (Addendum)
Bladder scan prior to foley insertion showed >999 mls

## 2016-04-17 NOTE — ED Provider Notes (Signed)
[facility]-EMERGENCY DEPARTMENT Provider Note   CSN: [id number] Arrival date & time: 04/17/16  4098     History   Chief Complaint Chief Complaint  Patient presents with  . Urinary Retention    HPI Yolanda Cantu is a 83 y.o. female history of A. fib on Coumadin, diabetes, hypertension, chronic urinary retention with indwelling Foley, here presenting with Foley unable to drain. Patient states that she gets her Foley changed every month by urology and last time it was changed was about 3 weeks ago. She was doing well until this morning. She noticed that there was no urine in the foley This morning and she felt that her bladder was very distended. Denies vomiting . Denies blood in urine. Patient is not on chronic abx prophylaxis.   The history is provided by the patient.    Past Medical History:  Diagnosis Date  . Arthritis    knees  . Atrial fibrillation (HCC)   . Diabetes mellitus without complication (HCC)   . Hypercholesteremia   . Hypertension   . Hypothyroidism   . Thyroid disease   . Wears dentures    partial lower    Patient Active Problem List   Diagnosis Date Noted  . Closed right hip fracture (HCC) 09/23/2015  . Diabetes (HCC) 09/23/2015  . HTN (hypertension) 09/23/2015  . Hypothyroidism 09/23/2015  . Atrial fibrillation (HCC) 09/23/2015  . Pure hypercholesterolemia 10/18/2014  . B12 deficiency 08/24/2013  . Cobalamin deficiency 08/24/2013    Past Surgical History:  Procedure Laterality Date  . ABDOMINAL HYSTERECTOMY    . APPENDECTOMY    . CATARACT EXTRACTION W/PHACO Left 05/09/2015   Procedure: CATARACT EXTRACTION PHACO AND INTRAOCULAR LENS PLACEMENT (IOC) left eye;  Surgeon: Sherald Hess, MD;  [facility];  Service: Ophthalmology;  Laterality: Left;  DIABETIC - oral meds per pt 8 or after for arrival time  . CHOLECYSTECTOMY    . INTRAMEDULLARY (IM) NAIL INTERTROCHANTERIC Right 09/25/2015   Procedure: INTRAMEDULLARY (IM) NAIL  INTERTROCHANTRIC;  Surgeon: Juanell Fairly, MD;  Location: [facility] OR[facility];  Service: Orthopedics;  Laterality: Right;  . SPLENECTOMY    . SPLENECTOMY    . TONSILLECTOMY    . TOTAL KNEE ARTHROPLASTY Left     OB History    No data available       Home Medications    Prior to Admission medications   Medication Sig Start Date End Date Taking? Authorizing Provider  acetaminophen (TYLENOL) 500 MG tablet Take 500 mg by mouth every 6 (six) hours as needed.    Historical Provider, MD  atorvastatin (LIPITOR) 40 MG tablet Take 20 mg by mouth daily.    Historical Provider, MD  cyanocobalamin (,VITAMIN B-12,) 1000 MCG/ML injection Inject into the muscle. 06/16/13   Historical Provider, MD  diltiazem (CARDIZEM CD) 240 MG 24 hr capsule TAKE 1 CAPSULE BY MOUTH ONCE A DAY 01/16/16   Historical Provider, MD  diltiazem (TIAZAC) 240 MG 24 hr capsule Take 240 mg by mouth daily.    Historical Provider, MD  ferrous sulfate 325 (65 FE) MG tablet Take by mouth.    Historical Provider, MD  furosemide (LASIX) 40 MG tablet Take by mouth. 10/18/15   Historical Provider, MD  latanoprost (XALATAN) 0.005 % ophthalmic solution Place 1 drop into both eyes at bedtime.    Historical Provider, MD  levothyroxine (SYNTHROID, LEVOTHROID) 125 MCG tablet Take 125 mcg by mouth daily before breakfast.    Historical Provider, MD  metFORMIN (GLUCOPHAGE) 500 MG tablet Take  250 mg by mouth 2 (two) times daily with a meal.    Historical Provider, MD  senna (SENOKOT) 8.6 MG tablet Take by mouth.    Historical Provider, MD  timolol (BETIMOL) 0.5 % ophthalmic solution Place 1 drop into both eyes daily.    Historical Provider, MD  warfarin (COUMADIN) 2 MG tablet Take 2 mg by mouth daily.    Historical Provider, MD    Family History Family History  Problem Relation Age of Onset  . Diabetes Father   . Stroke Father   . Lung cancer Brother   . Heart disease Mother   . Lung cancer Sister   . Prostate cancer Neg Hx   . Kidney cancer Neg Hx    . Bladder Cancer Neg Hx     Social History Social History  Substance Use Topics  . Smoking status: Never Smoker  . Smokeless tobacco: Never Used  . Alcohol use No     Allergies   Patient has no known allergies.   Review of Systems Review of Systems  Genitourinary: Positive for difficulty urinating.  All other systems reviewed and are negative.    Physical Exam Updated Vital Signs BP (!) 165/99   Pulse 93   Temp (!) 96.8 F (36 C) (Oral)   Resp 18   Ht 5\' 6"  (1.676 m)   Wt 151 lb (68.5 kg)   SpO2 95%   BMI 24.37 kg/m   Physical Exam  Constitutional: She is oriented to person, place, and time.  Chronically ill   HENT:  Head: Normocephalic.  Mouth/Throat: Oropharynx is clear and moist.  Eyes: EOM are normal. Pupils are equal, round, and reactive to light.  Neck: Normal range of motion. Neck supple.  Cardiovascular: Normal rate, regular rhythm and normal heart sounds.   Pulmonary/Chest: Effort normal and breath sounds normal. No respiratory distress. She has no wheezes.  Abdominal: Soft.  + lower abdominal distended, ? Enlarged bladder, mildly tender. No CVAT   Musculoskeletal: Normal range of motion.  Neurological: She is alert and oriented to person, place, and time. No cranial nerve deficit.  Skin: Skin is warm.  Psychiatric: She has a normal mood and affect.  Nursing note and vitals reviewed.    ED Treatments / Results  Labs (all labs ordered are listed, but only abnormal results are displayed) Labs Reviewed  CBC WITH DIFFERENTIAL/PLATELET - Abnormal; Notable for the following:       Result Value   RDW 15.8 (*)    All other components within normal limits  URINALYSIS, ROUTINE W REFLEX MICROSCOPIC - Abnormal; Notable for the following:    Color, Urine YELLOW (*)    APPearance HAZY (*)    Hgb urine dipstick MODERATE (*)    Protein, ur 30 (*)    Leukocytes, UA MODERATE (*)    Bacteria, UA RARE (*)    Squamous Epithelial / LPF 0-5 (*)    All  other components within normal limits  URINE CULTURE  COMPREHENSIVE METABOLIC PANEL    EKG  EKG Interpretation None       Radiology No results found.  Procedures Procedures (including critical care time)  Medications Ordered in ED Medications - No data to display   Initial Impression / Assessment and Plan / ED Course  I have reviewed the triage vital signs and the nursing notes.  Pertinent labs & imaging results that were available during my care of the patient were reviewed by me and considered in my medical decision making (  see chart for details).     Jonelle SportsMary R Cantu is a 83 y.o. female here with urinary retention. Patient's bladder scan showed > 1 L in bladder. She is on coumadin for afib. Will replace foley, check CBC, chemistry, PT / INR.   8:30 AM Labs unremarkable. CBC nl, Cr nl. UA ? UTI vs colonization. She has INR check tomorrow and prefer to get it done there. Recent INR was 3.7 but coumadin was decreased. About 800 cc came out of foley. Not sure if she had a small blood clot in the bladder or mild cystitis that caused the obstruction. Will give several doses of keflex as it will not affect her INR as much. She is hypertensive in the ED but states that she has white coat hypertension. BP 180s on arrival, down to 160s on discharge when retention has resolved. Has follow up with PCP, urology.    Final Clinical Impressions(s) / ED Diagnoses   Final diagnoses:  None    New Prescriptions New Prescriptions   No medications on file     Charlynne Panderavid Hsienta Yao, MD 04/17/16 [id number]

## 2016-04-19 LAB — URINE CULTURE: Culture: >=100000 — AB

## 2016-04-25 ENCOUNTER — Telehealth: Payer: Self-pay | Admitting: Urology

## 2016-04-25 NOTE — Telephone Encounter (Signed)
Pt was seen in ER 3/20.  She has an appt with Carollee HerterShannon 4/3.  She was told to follow up with urology.  She stopped by office to see if she should give a urine culture.  Chelsea said it would be O.K. To wait til 4/3.

## 2016-04-30 NOTE — Progress Notes (Signed)
05/01/2016 11:42 AM   Jonelle Sports 1925/10/23 [id number]  Referring provider: Marisue Ivan, MD [phone] [facility] [address] [facility] [place], [place] [address]  Chief Complaint  Patient presents with  . Urinary Retention    follow up from ER    HPI: 83 yo WF who presents today for an ED follow up.    Background history Patient is an 83 year old Caucasian female who presents today as a referral for incontinence by Dr. Burnadette Pop.  She states she suffered a right hip fracture and had a Foley placed at that time.  It was then discontinued either prior to discharge or at the rehabilitation center.  She states they tried CIC x 2, but they were unsuccessful at the rehab center.  A Foley was never replaced.   Since her discharge from the rehab center, she has been complaining of frequency of urination, urgency, nocturia x 2 and incontinence.  She denies dysuria, gross  hematuria and suprapubic pain.  She is not experiencing fevers, chills, nausea or vomiting.  She states she was not having difficulty with urination prior to the hip fracture for the exception of the nocturia.  Her PVR was 999 mL.  Her serum creatinine was 0.7 on 09/20/2015.  She is not very ambulatory at this time, but she is working with PT.  She was seen in the ED two weeks ago after her catheter stopped draining.  She was found to have > 700cc in her bladder and Foley was exchanged.  She denied any sediment or clots.  The new catheter has been draining well.   Today, she is having no complaints.   She is having no problems with her Foley at this time.  She has not had recent fevers, chills, nausea or vomiting.  She has not gross hematuria, suprapubic pain or dysuria.      PMH: Past Medical History:  Diagnosis Date  . Arthritis    knees  . Atrial fibrillation (HCC)   . Diabetes mellitus without complication (HCC)   . Hypercholesteremia   . Hypertension   . Hypothyroidism   . Thyroid disease   . Wears dentures     partial lower    Surgical History: Past Surgical History:  Procedure Laterality Date  . ABDOMINAL HYSTERECTOMY    . APPENDECTOMY    . CATARACT EXTRACTION W/PHACO Left 05/09/2015   Procedure: CATARACT EXTRACTION PHACO AND INTRAOCULAR LENS PLACEMENT (IOC) left eye;  Surgeon: Sherald Hess, MD;  [facility];  Service: Ophthalmology;  Laterality: Left;  DIABETIC - oral meds per pt 8 or after for arrival time  . CHOLECYSTECTOMY    . INTRAMEDULLARY (IM) NAIL INTERTROCHANTERIC Right 09/25/2015   Procedure: INTRAMEDULLARY (IM) NAIL INTERTROCHANTRIC;  Surgeon: Juanell Fairly, MD;  Location: [facility] OR[facility];  Service: Orthopedics;  Laterality: Right;  . SPLENECTOMY    . SPLENECTOMY    . TONSILLECTOMY    . TOTAL KNEE ARTHROPLASTY Left     Home Medications:  Allergies as of 05/01/2016   No Known Allergies     Medication List       Accurate as of 05/01/16 11:42 AM. Always use your most recent med list.          acetaminophen 500 MG tablet Commonly known as:  TYLENOL Take 500 mg by mouth every 6 (six) hours as needed.   atorvastatin 40 MG tablet Commonly known as:  LIPITOR Take 20 mg by mouth daily.   cyanocobalamin 1000 MCG/ML injection  Commonly known as:  (VITAMIN B-12) Inject into the muscle.   diltiazem 240 MG 24 hr capsule Commonly known as:  TIAZAC Take 240 mg by mouth daily.   diltiazem 240 MG 24 hr capsule Commonly known as:  CARDIZEM CD TAKE 1 CAPSULE BY MOUTH ONCE A DAY   ferrous sulfate 325 (65 FE) MG tablet Take by mouth.   furosemide 40 MG tablet Commonly known as:  LASIX Take by mouth.   latanoprost 0.005 % ophthalmic solution Commonly known as:  XALATAN Place 1 drop into both eyes at bedtime.   levothyroxine 125 MCG tablet Commonly known as:  SYNTHROID, LEVOTHROID Take 125 mcg by mouth daily before breakfast.   metFORMIN 500 MG tablet Commonly known as:  GLUCOPHAGE Take 250 mg by mouth 2 (two) times daily with a meal.     senna 8.6 MG tablet Commonly known as:  SENOKOT Take by mouth.   timolol 0.5 % ophthalmic solution Commonly known as:  BETIMOL Place 1 drop into both eyes daily.   warfarin 2 MG tablet Commonly known as:  COUMADIN Take 2 mg by mouth daily.       Allergies: No Known Allergies  Family History: Family History  Problem Relation Age of Onset  . Diabetes Father   . Stroke Father   . Lung cancer Brother   . Heart disease Mother   . Lung cancer Sister   . Prostate cancer Neg Hx   . Kidney cancer Neg Hx   . Bladder Cancer Neg Hx     Social History:  reports that she has never smoked. She has never used smokeless tobacco. She reports that she does not drink alcohol or use drugs.  ROS: UROLOGY Frequent Urination?: No Hard to postpone urination?: No Burning/pain with urination?: No Get up at night to urinate?: No Leakage of urine?: No Urine stream starts and stops?: No Trouble starting stream?: No Do you have to strain to urinate?: No Blood in urine?: No Urinary tract infection?: No Sexually transmitted disease?: No Injury to kidneys or bladder?: No Painful intercourse?: No Weak stream?: No Currently pregnant?: No Vaginal bleeding?: No Last menstrual period?: n  Gastrointestinal Nausea?: No Vomiting?: No Indigestion/heartburn?: No Diarrhea?: No Constipation?: No  Constitutional Fever: No Night sweats?: No Weight loss?: No Fatigue?: No  Skin Skin rash/lesions?: No Itching?: No  Eyes Blurred vision?: No Double vision?: No  Ears/Nose/Throat Sore throat?: No Sinus problems?: No  Hematologic/Lymphatic Swollen glands?: No Easy bruising?: No  Cardiovascular Leg swelling?: No Chest pain?: No  Respiratory Cough?: No Shortness of breath?: No  Endocrine Excessive thirst?: No  Musculoskeletal Back pain?: No Joint pain?: No  Neurological Headaches?: No Dizziness?: No  Psychologic Depression?: No Anxiety?: No  Physical Exam: BP (!)  180/72   Pulse 71   Ht  (1.676 m)   Wt 151 lb 1.6 oz (68.5 kg)   BMI 24.39 kg/m   Constitutional: Well nourished. Alert and oriented, No acute distress. HEENT: [place] AT, moist mucus membranes. Trachea midline, no masses. Cardiovascular: No clubbing, cyanosis, or edema. Respiratory: Normal respiratory effort, no increased work of breathing. GI: Abdomen is soft, non tender, non distended, no abdominal masses. Liver and spleen not palpable.  No hernias appreciated.  Stool sample for occult testing is not indicated.   GU: No CVA tenderness.  Bladder fullness is noted.     Skin: No rashes, bruises or suspicious lesions. Lymph: No cervical or inguinal adenopathy. Neurologic: Grossly intact, no focal deficits, moving all 4 extremities.  Psychiatric: Normal mood and affect.  Laboratory Data: Lab Results  Component Value Date   WBC 7.0 04/17/2016   HGB 12.2 04/17/2016   HCT 37.3 04/17/2016   MCV 96.6 04/17/2016   PLT 334 04/17/2016    Lab Results  Component Value Date   CREATININE 0.56 04/17/2016    Assessment & Plan:    1. Urinary retention  - KUB to rule out bladder stones as a source of catheter clogging  - RTC in 2 weeks for catheter exchange  - cystoscopy will need to be performed in 09/2016   Return in about 2 weeks (around 05/15/2016) for catheter exchange on the nurse schedule.  These notes generated with voice recognition software. I apologize for typographical errors.  Michiel Cowboy, PA-C  [facility] [address] [place], [place] [address] [phone]

## 2016-05-01 ENCOUNTER — Ambulatory Visit
Admission: RE | Admit: 2016-05-01 | Discharge: 2016-05-01 | Disposition: A | Discharge status: Home / Self Care | Payer: Medicare Other | Source: Ambulatory Visit | Attending: Urology | Admitting: Urology

## 2016-05-01 ENCOUNTER — Ambulatory Visit (INDEPENDENT_AMBULATORY_CARE_PROVIDER_SITE_OTHER): Payer: Medicare Other | Admitting: Urology

## 2016-05-01 ENCOUNTER — Encounter: Payer: Self-pay | Admitting: Urology

## 2016-05-01 VITALS — BP 180/72 | HR 71 | Ht 66.0 in | Wt 151.1 lb

## 2016-05-01 DIAGNOSIS — R109 Unspecified abdominal pain: Secondary | ICD-10-CM | POA: Insufficient documentation

## 2016-05-01 DIAGNOSIS — R339 Retention of urine, unspecified: Secondary | ICD-10-CM | POA: Insufficient documentation

## 2016-05-02 ENCOUNTER — Telehealth: Payer: Self-pay

## 2016-05-02 NOTE — Telephone Encounter (Signed)
-----   Message from Harle Battiest, PA-C sent at 05/01/2016  3:58 PM EDT ----- Please let the patient know that no bladder stones were seen on her x-ray.

## 2016-05-02 NOTE — Telephone Encounter (Signed)
LMOM

## 2016-05-02 NOTE — Telephone Encounter (Signed)
Spoke with pt in reference to xray results. Pt voiced understanding.  

## 2016-05-11 ENCOUNTER — Encounter: Payer: Self-pay | Admitting: Emergency Medicine

## 2016-05-11 DIAGNOSIS — I10 Essential (primary) hypertension: Secondary | ICD-10-CM | POA: Insufficient documentation

## 2016-05-11 DIAGNOSIS — N390 Urinary tract infection, site not specified: Secondary | ICD-10-CM | POA: Diagnosis not present

## 2016-05-11 DIAGNOSIS — Z7901 Long term (current) use of anticoagulants: Secondary | ICD-10-CM | POA: Insufficient documentation

## 2016-05-11 DIAGNOSIS — Z7984 Long term (current) use of oral hypoglycemic drugs: Secondary | ICD-10-CM | POA: Insufficient documentation

## 2016-05-11 DIAGNOSIS — E039 Hypothyroidism, unspecified: Secondary | ICD-10-CM | POA: Diagnosis not present

## 2016-05-11 DIAGNOSIS — E119 Type 2 diabetes mellitus without complications: Secondary | ICD-10-CM | POA: Insufficient documentation

## 2016-05-11 DIAGNOSIS — Z960 Presence of urogenital implants: Secondary | ICD-10-CM | POA: Insufficient documentation

## 2016-05-11 DIAGNOSIS — Z79899 Other long term (current) drug therapy: Secondary | ICD-10-CM | POA: Diagnosis not present

## 2016-05-11 DIAGNOSIS — R1030 Lower abdominal pain, unspecified: Secondary | ICD-10-CM | POA: Diagnosis present

## 2016-05-11 LAB — COMPREHENSIVE METABOLIC PANEL
ALT: 13 U/L — ABNORMAL LOW (ref 14–54)
ANION GAP: 7 (ref 5–15)
AST: 27 U/L (ref 15–41)
Albumin: 4 g/dL (ref 3.5–5.0)
Alkaline Phosphatase: 61 U/L (ref 38–126)
BUN: 7 mg/dL (ref 6–20)
CO2: 26 mmol/L (ref 22–32)
Calcium: 9.3 mg/dL (ref 8.9–10.3)
Chloride: 99 mmol/L — ABNORMAL LOW (ref 101–111)
Creatinine, Ser: 0.73 mg/dL (ref 0.44–1.00)
GFR calc Af Amer: >60 mL/min (ref >60)
Glucose, Bld: 101 mg/dL — ABNORMAL HIGH (ref 65–99)
POTASSIUM: 3.7 mmol/L (ref 3.5–5.1)
Sodium: 132 mmol/L — ABNORMAL LOW (ref 135–145)
TOTAL PROTEIN: 7.8 g/dL (ref 6.5–8.1)
Total Bilirubin: 0.3 mg/dL (ref 0.3–1.2)

## 2016-05-11 LAB — URINALYSIS, COMPLETE (UACMP) WITH MICROSCOPIC
Bilirubin Urine: NEGATIVE
GLUCOSE, UA: NEGATIVE mg/dL
Ketones, ur: NEGATIVE mg/dL
NITRITE: NEGATIVE
Protein, ur: NEGATIVE mg/dL
SPECIFIC GRAVITY, URINE: 1.002 — AB (ref 1.005–1.030)
pH: 9 — ABNORMAL HIGH (ref 5.0–8.0)

## 2016-05-11 LAB — CBC
HEMATOCRIT: 36 % (ref 35.0–47.0)
Hemoglobin: 11.7 g/dL — ABNORMAL LOW (ref 12.0–16.0)
MCH: 31.3 pg (ref 26.0–34.0)
MCHC: 32.6 g/dL (ref 32.0–36.0)
MCV: 96.1 fL (ref 80.0–100.0)
Platelets: 349 10*3/uL (ref 150–440)
RBC: 3.75 MIL/uL — ABNORMAL LOW (ref 3.80–5.20)
RDW: 15.1 % — AB (ref 11.5–14.5)
WBC: 6.9 10*3/uL (ref 3.6–11.0)

## 2016-05-11 LAB — LIPASE, BLOOD: LIPASE: 26 U/L (ref 11–51)

## 2016-05-11 NOTE — ED Triage Notes (Signed)
Pt ambulatory to triage in NAD, report lower abd pressure, has long term foley in place, states draining more than usual and urine is almost clear.

## 2016-05-12 ENCOUNTER — Emergency Department
Admission: EM | Admit: 2016-05-12 | Discharge: 2016-05-12 | Disposition: A | Discharge status: Home / Self Care | Payer: Medicare Other | Attending: Emergency Medicine | Admitting: Emergency Medicine

## 2016-05-12 DIAGNOSIS — Z960 Presence of urogenital implants: Secondary | ICD-10-CM

## 2016-05-12 DIAGNOSIS — Z978 Presence of other specified devices: Secondary | ICD-10-CM

## 2016-05-12 DIAGNOSIS — R1030 Lower abdominal pain, unspecified: Secondary | ICD-10-CM

## 2016-05-12 DIAGNOSIS — N390 Urinary tract infection, site not specified: Secondary | ICD-10-CM

## 2016-05-12 MED ORDER — CEPHALEXIN 500 MG PO CAPS
500.0000 mg | ORAL_CAPSULE | Freq: Once | ORAL | Status: AC
  Administered 2016-05-12: 500 mg via ORAL
  Filled 2016-05-12: qty 1

## 2016-05-12 MED ORDER — CEPHALEXIN 500 MG PO CAPS: 500.0000 mg | ORAL_CAPSULE | Freq: Two times a day (BID) | ORAL | 0 refills | Status: DC

## 2016-05-12 NOTE — ED Provider Notes (Signed)
[facility] Emergency Department Provider Note   ____________________________________________   First MD Initiated Contact with Patient 05/12/16 0154     (approximate)  I have reviewed the triage vital signs and the nursing notes.   HISTORY  Chief Complaint Abdominal Pain    HPI Yolanda Cantu is a 83 y.o. female who presents to the ED from home with a chief complaint of suprapubic pressure and draining more urine than usual.Patient wears a Foley catheter chronically. Last changed on 3/20 when she was evaluated in the ED and placed on Keflex for UTI. Reports suprapubic discomfort starting yesterday. Denies associated fever, chills, chest pain, shortness of breath, nausea, vomiting. States she has had to change her leg bag more than usual today and feels like she has a UTI. Denies recent travel or trauma.   Past Medical History:  Diagnosis Date  . Arthritis    knees  . Atrial fibrillation (HCC)   . Diabetes mellitus without complication (HCC)   . Hypercholesteremia   . Hypertension   . Hypothyroidism   . Thyroid disease   . Wears dentures    partial lower    Patient Active Problem List   Diagnosis Date Noted  . Closed right hip fracture (HCC) 09/23/2015  . Diabetes (HCC) 09/23/2015  . HTN (hypertension) 09/23/2015  . Hypothyroidism 09/23/2015  . Atrial fibrillation (HCC) 09/23/2015  . Pure hypercholesterolemia 10/18/2014  . B12 deficiency 08/24/2013  . Cobalamin deficiency 08/24/2013    Past Surgical History:  Procedure Laterality Date  . ABDOMINAL HYSTERECTOMY    . APPENDECTOMY    . CATARACT EXTRACTION W/PHACO Left 05/09/2015   Procedure: CATARACT EXTRACTION PHACO AND INTRAOCULAR LENS PLACEMENT (IOC) left eye;  Surgeon: Sherald Hess, MD;  Location: [facility];  Service: Ophthalmology;  Laterality: Left;  DIABETIC - oral meds per pt 8 or after for arrival time  . CHOLECYSTECTOMY    . INTRAMEDULLARY (IM) NAIL  INTERTROCHANTERIC Right 09/25/2015   Procedure: INTRAMEDULLARY (IM) NAIL INTERTROCHANTRIC;  Surgeon: Juanell Fairly, MD;  Location: [facility] OR[facility];  Service: Orthopedics;  Laterality: Right;  . SPLENECTOMY    . SPLENECTOMY    . TONSILLECTOMY    . TOTAL KNEE ARTHROPLASTY Left     Prior to Admission medications   Medication Sig Start Date End Date Taking? Authorizing Provider  acetaminophen (TYLENOL) 500 MG tablet Take 500 mg by mouth every 6 (six) hours as needed.    Historical Provider, MD  atorvastatin (LIPITOR) 40 MG tablet Take 20 mg by mouth daily.    Historical Provider, MD  cephALEXin (KEFLEX) 500 MG capsule Take 1 capsule (500 mg total) by mouth 2 (two) times daily. 05/12/16   Irean Hong, MD  cyanocobalamin (,VITAMIN B-12,) 1000 MCG/ML injection Inject into the muscle. 06/16/13   Historical Provider, MD  diltiazem (CARDIZEM CD) 240 MG 24 hr capsule TAKE 1 CAPSULE BY MOUTH ONCE A DAY 01/16/16   Historical Provider, MD  diltiazem (TIAZAC) 240 MG 24 hr capsule Take 240 mg by mouth daily.    Historical Provider, MD  ferrous sulfate 325 (65 FE) MG tablet Take by mouth.    Historical Provider, MD  furosemide (LASIX) 40 MG tablet Take by mouth. 10/18/15   Historical Provider, MD  latanoprost (XALATAN) 0.005 % ophthalmic solution Place 1 drop into both eyes at bedtime.    Historical Provider, MD  levothyroxine (SYNTHROID, LEVOTHROID) 125 MCG tablet Take 125 mcg by mouth daily before breakfast.    Historical Provider, MD  metFORMIN (GLUCOPHAGE) 500 MG tablet Take 250 mg by mouth 2 (two) times daily with a meal.    Historical Provider, MD  senna (SENOKOT) 8.6 MG tablet Take by mouth.    Historical Provider, MD  timolol (BETIMOL) 0.5 % ophthalmic solution Place 1 drop into both eyes daily.    Historical Provider, MD  warfarin (COUMADIN) 2 MG tablet Take 2 mg by mouth daily.    Historical Provider, MD    Allergies Patient has no known allergies.  Family History  Problem Relation Age of Onset  .  Diabetes Father   . Stroke Father   . Lung cancer Brother   . Heart disease Mother   . Lung cancer Sister   . Prostate cancer Neg Hx   . Kidney cancer Neg Hx   . Bladder Cancer Neg Hx     Social History Social History  Substance Use Topics  . Smoking status: Never Smoker  . Smokeless tobacco: Never Used  . Alcohol use No    Review of Systems  Constitutional: No fever/chills. Eyes: No visual changes. ENT: No sore throat. Cardiovascular: Denies chest pain. Respiratory: Denies shortness of breath. Gastrointestinal: Positive for suprapubic abdominal pain.  No nausea, no vomiting.  No diarrhea.  No constipation. Negative for flank pain. Genitourinary: Positive for frequency. Negative for dysuria. Musculoskeletal: Negative for back pain. Skin: Negative for rash. Neurological: Negative for headaches, focal weakness or numbness.  10-point ROS otherwise negative.  ____________________________________________   PHYSICAL EXAM:  VITAL SIGNS: ED Triage Vitals  Enc Vitals Group     BP 05/11/16 2121 (!) 201/72     Pulse Rate 05/11/16 2121 60     Resp 05/11/16 2121 18     Temp 05/11/16 2121 97.8 F (36.6 C)     Temp Source 05/11/16 2121 Oral     SpO2 05/11/16 2121 98 %     Weight 05/11/16 2121 151 lb (68.5 kg)     Height 05/11/16 2121  (1.676 m)     Head Circumference --      Peak Flow --      Pain Score 05/11/16 2123 7     Pain Loc --      Pain Edu? --      Excl. in GC? --     Constitutional: Alert and oriented. Well appearing and in no acute distress. Eyes: Conjunctivae are normal. PERRL. EOMI. Head: Atraumatic. Nose: No congestion/rhinnorhea. Mouth/Throat: Mucous membranes are moist.  Oropharynx non-erythematous. Neck: No stridor.   Cardiovascular: Normal rate, regular rhythm. Grossly normal heart sounds.  Good peripheral circulation. Respiratory: Normal respiratory effort.  No retractions. Lungs CTAB. Gastrointestinal: Soft and minimally tender to palpation  suprapubic area without rebound or guarding. No distention. No abdominal bruits. No CVA tenderness. Musculoskeletal: No lower extremity tenderness nor edema.  No joint effusions. Neurologic:  Normal speech and language. No gross focal neurologic deficits are appreciated. No gait instability. Skin:  Skin is warm, dry and intact. No rash noted. Psychiatric: Mood and affect are normal. Speech and behavior are normal.  ____________________________________________   LABS (all labs ordered are listed, but only abnormal results are displayed)  Labs Reviewed  CBC - Abnormal; Notable for the following:       Result Value   RBC 3.75 (*)    Hemoglobin 11.7 (*)    RDW 15.1 (*)    All other components within normal limits  COMPREHENSIVE METABOLIC PANEL - Abnormal; Notable for the following:    Sodium 132 (*)  Chloride 99 (*)    Glucose, Bld 101 (*)    ALT 13 (*)    All other components within normal limits  URINALYSIS, COMPLETE (UACMP) WITH MICROSCOPIC - Abnormal; Notable for the following:    Color, Urine YELLOW (*)    APPearance CLOUDY (*)    Specific Gravity, Urine 1.002 (*)    pH 9.0 (*)    Hgb urine dipstick MODERATE (*)    Leukocytes, UA TRACE (*)    Bacteria, UA MANY (*)    Squamous Epithelial / LPF 0-5 (*)    All other components within normal limits  URINE CULTURE  LIPASE, BLOOD  LIPASE, BLOOD  COMPREHENSIVE METABOLIC PANEL  CBC  URINALYSIS, COMPLETE (UACMP) WITH MICROSCOPIC  LIPASE, BLOOD  COMPREHENSIVE METABOLIC PANEL  CBC  URINALYSIS, COMPLETE (UACMP) WITH MICROSCOPIC   ____________________________________________  EKG  None ____________________________________________  RADIOLOGY  None ____________________________________________   PROCEDURES  Procedure(s) performed: None  Procedures  Critical Care performed: No  ____________________________________________   INITIAL IMPRESSION / ASSESSMENT AND PLAN / ED COURSE  Pertinent labs & imaging results  that were available during my care of the patient were reviewed by me and considered in my medical decision making (see chart for details).  83 year old female with indwelling Foley who presents with suprapubic discomfort and urinary frequency. Urinalysis improved from 3/20 although trace leukocytes and 6-30 WBC remain. Patient states she only had a 3 day course of Keflex. Will obtain urine culture, placed on a one-week course of Keflex (patient takes Coumadin) and patient will follow-up with her PCP early next week. Offered to change her Foley this morning but patient states she has an appointment for Foley change in 4 days and would rather keep that appointment. Strict return precautions given. Patient and spouse verbalize understanding and agree with plan of care.      ____________________________________________   FINAL CLINICAL IMPRESSION(S) / ED DIAGNOSES  Final diagnoses:  Lower urinary tract infectious disease  Lower abdominal pain  Foley catheter in place      NEW MEDICATIONS STARTED DURING THIS VISIT:  New Prescriptions   CEPHALEXIN (KEFLEX) 500 MG CAPSULE    Take 1 capsule (500 mg total) by mouth 2 (two) times daily.     Note:  This document was prepared using Dragon voice recognition software and may include unintentional dictation errors.    Irean Hong, MD 05/12/16 [phone]

## 2016-05-12 NOTE — ED Notes (Signed)
Pt. Has leg bag on at this time.  Urine bag empted in room.

## 2016-05-12 NOTE — ED Notes (Signed)
Pt. States lower traverse abdominal pain that started this afternoon.  Pt. States foley catheter was put in after rt. Hip surgery.  Pt. States she went to rehab and catheter was kept due to inability for pt. To control urine flow.

## 2016-05-12 NOTE — Discharge Instructions (Signed)
1. Take antibiotic as prescribed (Keflex 500 mg twice daily 7 days). 2. Return to the ER for worsening symptoms, fever, persistent vomiting or other concerns.

## 2016-05-12 NOTE — ED Notes (Signed)
Pt. Going home with family. 

## 2016-05-13 LAB — URINE CULTURE

## 2016-05-14 ENCOUNTER — Ambulatory Visit (INDEPENDENT_AMBULATORY_CARE_PROVIDER_SITE_OTHER): Payer: Medicare Other | Admitting: Urology

## 2016-05-14 ENCOUNTER — Encounter: Payer: Self-pay | Admitting: Urology

## 2016-05-14 ENCOUNTER — Telehealth: Payer: Self-pay

## 2016-05-14 VITALS — BP 165/80 | HR 78 | Ht 66.0 in | Wt 150.0 lb

## 2016-05-14 DIAGNOSIS — R339 Retention of urine, unspecified: Secondary | ICD-10-CM

## 2016-05-14 DIAGNOSIS — R358 Other polyuria: Secondary | ICD-10-CM | POA: Diagnosis not present

## 2016-05-14 DIAGNOSIS — N3289 Other specified disorders of bladder: Secondary | ICD-10-CM

## 2016-05-14 DIAGNOSIS — R3589 Other polyuria: Secondary | ICD-10-CM

## 2016-05-14 MED ORDER — OXYBUTYNIN CHLORIDE 5 MG PO TABS: 5.0000 mg | ORAL_TABLET | Freq: Three times a day (TID) | ORAL | 3 refills | Status: DC

## 2016-05-14 NOTE — Progress Notes (Signed)
Cath Change/ Replacement  Patient is present today for a catheter change due to urinary retention.  8ml of water was removed from the balloon, a 16FR foley cath was removed with out difficulty.  Patient was cleaned and prepped in a sterile fashion with betadine and 2% lidocaine jelly was instilled into the urethra. A 16 FR foley cath was replaced into the bladder no complications were noted Urine return was noted 5ml and urine was clear yellow in color. The balloon was filled with 10ml of sterile water. A leg bag was attached for drainage.  A night bag was also given to the patient and patient was given instruction on how to change from one bag to another. Patient was given proper instruction on catheter care.    Preformed by: Eligha Bridegroom, CMA

## 2016-05-14 NOTE — Progress Notes (Signed)
05/14/2016 10:32 AM   Yolanda Cantu [birth year] [id number]  Referring provider: Marisue Ivan, MD [phone] [facility] [address] [facility] [place], [place] [address]  Chief Complaint  Patient presents with  . Urinary Retention    HPI: 83 yo WF who presents today for an ED follow up.    Background history Patient with chronic urinary retention since a right hip fracture.  Patient has failed several TOV's and has chosen to manage her retention with an indwelling Foley.     She was seen in the [facility] ED on 05/12/2016 for suprapubic pressure and draining more urine than usual.  She was diagnosed with an UTI, given Keflex and discharged to home.  Urine culture from the emergency room was positive for multiple species present.  Today, she still complains of the suprapubic pressure and draining more urine than usual.  She is experiencing leakage around the catheter.  Urine in the catheter is clear and yellow.  She states that she is drinking sixteen 8 ounces bottles of water daily.   She is not having gross hematuria. She denies fever, chills, nausea and vomiting.  PMH: Past Medical History:  Diagnosis Date  . Arthritis    knees  . Atrial fibrillation (HCC)   . Diabetes mellitus without complication (HCC)   . Hypercholesteremia   . Hypertension   . Hypothyroidism   . Thyroid disease   . Wears dentures    partial lower    Surgical History: Past Surgical History:  Procedure Laterality Date  . ABDOMINAL HYSTERECTOMY    . APPENDECTOMY    . CATARACT EXTRACTION W/PHACO Left 05/09/2015   Procedure: CATARACT EXTRACTION PHACO AND INTRAOCULAR LENS PLACEMENT (IOC) left eye;  Surgeon: Sherald Hess, MD;  Location: [facility];  Service: Ophthalmology;  Laterality: Left;  DIABETIC - oral meds per pt 8 or after for arrival time  . CHOLECYSTECTOMY    . INTRAMEDULLARY (IM) NAIL INTERTROCHANTERIC Right 09/25/2015   Procedure: INTRAMEDULLARY (IM) NAIL INTERTROCHANTRIC;   Surgeon: Juanell Fairly, MD;  Location: [facility];  Service: Orthopedics;  Laterality: Right;  . SPLENECTOMY    . SPLENECTOMY    . TONSILLECTOMY    . TOTAL KNEE ARTHROPLASTY Left     Home Medications:  Allergies as of 05/14/2016   No Known Allergies     Medication List       Accurate as of 05/14/16 11:59 PM. Always use your most recent med list.          acetaminophen 500 MG tablet Commonly known as:  TYLENOL Take 500 mg by mouth every 6 (six) hours as needed.   atorvastatin 40 MG tablet Commonly known as:  LIPITOR Take 20 mg by mouth daily.   cephALEXin 500 MG capsule Commonly known as:  KEFLEX Take 1 capsule (500 mg total) by mouth 2 (two) times daily.   cyanocobalamin 1000 MCG/ML injection Commonly known as:  (VITAMIN B-12) Inject into the muscle.   diltiazem 240 MG 24 hr capsule Commonly known as:  TIAZAC Take 240 mg by mouth daily.   diltiazem 240 MG 24 hr capsule Commonly known as:  CARDIZEM CD TAKE 1 CAPSULE BY MOUTH ONCE A DAY   ferrous sulfate 325 (65 FE) MG tablet Take by mouth.   furosemide 40 MG tablet Commonly known as:  LASIX Take by mouth.   latanoprost 0.005 % ophthalmic solution Commonly known as:  XALATAN Place 1 drop into both eyes at bedtime.   levothyroxine 125 MCG tablet Commonly  known as:  SYNTHROID, LEVOTHROID Take 125 mcg by mouth daily before breakfast.   metFORMIN 500 MG tablet Commonly known as:  GLUCOPHAGE Take 250 mg by mouth 2 (two) times daily with a meal.   oxybutynin 5 MG tablet Commonly known as:  DITROPAN Take 1 tablet (5 mg total) by mouth 3 (three) times daily.   senna 8.6 MG tablet Commonly known as:  SENOKOT Take by mouth.   timolol 0.5 % ophthalmic solution Commonly known as:  BETIMOL Place 1 drop into both eyes daily.   warfarin 2 MG tablet Commonly known as:  COUMADIN Take 2 mg by mouth daily.       Allergies: No Known Allergies  Family History: Family History  Problem Relation Age of Onset    . Diabetes Father   . Stroke Father   . Lung cancer Brother   . Heart disease Mother   . Lung cancer Sister   . Prostate cancer Neg Hx   . Kidney cancer Neg Hx   . Bladder Cancer Neg Hx     Social History:  reports that she has never smoked. She has never used smokeless tobacco. She reports that she does not drink alcohol or use drugs.  ROS: UROLOGY Frequent Urination?: No Hard to postpone urination?: No Burning/pain with urination?: No Get up at night to urinate?: No Leakage of urine?: Yes Urine stream starts and stops?: No Trouble starting stream?: No Do you have to strain to urinate?: No Blood in urine?: No Urinary tract infection?: No Sexually transmitted disease?: No Injury to kidneys or bladder?: No Painful intercourse?: No Weak stream?: No Currently pregnant?: No Vaginal bleeding?: No Last menstrual period?: n  Gastrointestinal Nausea?: No Vomiting?: No Indigestion/heartburn?: No Diarrhea?: No Constipation?: No  Constitutional Fever: No Night sweats?: No Weight loss?: No Fatigue?: No  Skin Skin rash/lesions?: No Itching?: No  Eyes Blurred vision?: No Double vision?: No  Ears/Nose/Throat Sore throat?: No Sinus problems?: No  Hematologic/Lymphatic Swollen glands?: No Easy bruising?: No  Cardiovascular Leg swelling?: No Chest pain?: No  Respiratory Cough?: No Shortness of breath?: No  Endocrine Excessive thirst?: No  Musculoskeletal Back pain?: No Joint pain?: No  Neurological Headaches?: No Dizziness?: No  Psychologic Depression?: No Anxiety?: No  Physical Exam: BP (!) 165/80   Pulse 78   Ht  (1.676 m)   Wt 150 lb (68 kg)   BMI 24.21 kg/m   Constitutional: Well nourished. Alert and oriented, No acute distress. HEENT: [place] AT, moist mucus membranes. Trachea midline, no masses. Cardiovascular: No clubbing, cyanosis, or edema. Respiratory: Normal respiratory effort, no increased work of breathing. Skin: No rashes,  bruises or suspicious lesions. Lymph: No cervical or inguinal adenopathy. Neurologic: Grossly intact, no focal deficits, moving all 4 extremities. Psychiatric: Normal mood and affect.  Laboratory Data: Lab Results  Component Value Date   WBC 6.9 05/11/2016   HGB 11.7 (L) 05/11/2016   HCT 36.0 05/11/2016   MCV 96.1 05/11/2016   PLT 349 05/11/2016    Lab Results  Component Value Date   CREATININE 0.73 05/11/2016    Assessment & Plan:    1. Urinary retention  - Foley catheter exchanged today  - RTC in 1 month for catheter exchange  - cystoscopy will need to be performed in 09/2016  2. Bladder spasms  - Prescribed oxybutynin 5 mg 3 times a day when necessary for bladder spasms  - RTC in one month for symptom recheck  3. Increase urine output  - Patient  advised to cut her fluid intake and half  - RTC in one month for symptom recheck   Return in about 1 month (around 06/13/2016) for  symptom recheck and Foley exchange on my schedule.  These notes generated with voice recognition software. I apologize for typographical errors.  Michiel Cowboy, PA-C  [facility] [address] [place], [place] [address] [phone]

## 2016-05-14 NOTE — Telephone Encounter (Signed)
Pt called stating she went to the ER Friday due to foley discomfort. Pt stated ER placed pt on abx. Pt c/o severe pain and feeling as though bladder is falling out. Made pt aware has a nurse visit appt tomorrow to change foley. Then offered pt to come in this afternoon on nurse schedule. Pt denied both and demanded to see [facility] today. Was able to add pt to [facility] schedule today at 3:30. Pt voiced understanding.

## 2016-05-15 ENCOUNTER — Ambulatory Visit: Payer: Medicare Other

## 2016-05-17 ENCOUNTER — Ambulatory Visit: Payer: Medicare Other

## 2016-06-12 NOTE — Progress Notes (Signed)
06/14/2016 12:04 PM   Yolanda OttoMary Rose Cantu 03/11/1925 [id number]  Referring provider: Marisue IvanLinthavong, Kanhka, MD [phone] [facility] [place], [place] [id number]  Chief Complaint  Patient presents with  . Urinary Retention    follow up     HPI: 83 yo WF who presents today for a one month follow up for bladder spasms and urinary retention managed with an indwelling Foley.   83 yo WF who presents today for an one year follow up.    Background history Patient with chronic urinary retention since a right hip fracture.  Patient has failed several TOV's and has chosen to manage her retention with an indwelling Foley.  [facility] for suprapubic pressure and draining more urine than usual.  She was diagnosed with an UTI, given Keflex and discharged to home.  Urine culture from the emergency room was positive for multiple species present.  Today, she still complains of the suprapubic pressure and draining more urine than usual.  She is experiencing leakage around the catheter.  Urine in the catheter is clear and yellow.  She states that she is drinking sixteen 8 ounces bottles of water daily.   She is not having gross hematuria. She denies fever, chills, nausea and vomiting.  At her visit one month ago, her catheter was exchanged, she was prescribed oxybutynin IR 5 mg when necessary for bladder spasms and instructed to reduce her fluid intake in an effort to reduce her bladder spasms.  Today, she states that reducing her fluid intake and the oxybutynin have relieved her spasms. She has not had any further spasms since her visit with us 1 month ago. She also has noticed that the urine is clear in the Foley catheter is not had sediment or hematuria. She's not had fevers, chills, nausea or vomiting.  PMH: Past Medical History:  Diagnosis Date  . Arthritis    knees  . Atrial fibrillation (HCC)   . Diabetes mellitus without complication (HCC)   .  Hypercholesteremia   . Hypertension   . Hypothyroidism   . Thyroid disease   . Wears dentures    partial lower    Surgical History: Past Surgical History:  Procedure Laterality Date  . ABDOMINAL HYSTERECTOMY    . APPENDECTOMY    . CATARACT EXTRACTION W/PHACO Left 05/09/2015   Procedure: CATARACT EXTRACTION PHACO AND INTRAOCULAR LENS PLACEMENT (IOC) left eye;  Surgeon: Sherald HessAnita Prakash Vin-Parikh, MD;  [facility];  Service: Ophthalmology;  Laterality: Left;  DIABETIC - oral meds per pt 8 or after for arrival time  . CHOLECYSTECTOMY    . INTRAMEDULLARY (IM) NAIL INTERTROCHANTERIC Right 09/25/2015   Procedure: INTRAMEDULLARY (IM) NAIL INTERTROCHANTRIC;  Surgeon: Juanell FairlyKevin Krasinski, MD;  Location: [facility];  Service: Orthopedics;  Laterality: Right;  . SPLENECTOMY    . SPLENECTOMY    . TONSILLECTOMY    . TOTAL KNEE ARTHROPLASTY Left     Home Medications:  Allergies as of 06/14/2016   No Known Allergies     Medication List       Accurate as of 06/14/16 12:04 PM. Always use your most recent med list.          acetaminophen 500 MG tablet Commonly known as:  TYLENOL Take 500 mg by mouth every 6 (six) hours as needed.   atorvastatin 40 MG tablet Commonly known as:  LIPITOR Take 20 mg by mouth daily.   cephALEXin 500 MG capsule Commonly known  as:  KEFLEX Take 1 capsule (500 mg total) by mouth 2 (two) times daily.   diltiazem 240 MG 24 hr capsule Commonly known as:  TIAZAC Take 240 mg by mouth daily.   diltiazem 240 MG 24 hr capsule Commonly known as:  CARDIZEM CD TAKE 1 CAPSULE BY MOUTH ONCE A DAY   ferrous sulfate 325 (65 FE) MG tablet Take by mouth.   furosemide 40 MG tablet Commonly known as:  LASIX Take by mouth.   latanoprost 0.005 % ophthalmic solution Commonly known as:  XALATAN Place 1 drop into both eyes at bedtime.   levothyroxine 125 MCG tablet Commonly known as:  SYNTHROID, LEVOTHROID Take 125 mcg by mouth daily before breakfast.     metFORMIN 500 MG tablet Commonly known as:  GLUCOPHAGE Take 250 mg by mouth 2 (two) times daily with a meal.   oxybutynin 5 MG tablet Commonly known as:  DITROPAN Take 1 tablet (5 mg total) by mouth 3 (three) times daily.   senna 8.6 MG tablet Commonly known as:  SENOKOT Take by mouth.   timolol 0.5 % ophthalmic solution Commonly known as:  BETIMOL Place 1 drop into both eyes daily.   warfarin 2 MG tablet Commonly known as:  COUMADIN Take 2 mg by mouth daily.       Allergies: No Known Allergies  Family History: Family History  Problem Relation Age of Onset  . Diabetes Father   . Stroke Father   . Lung cancer Brother   . Heart disease Mother   . Lung cancer Sister   . Prostate cancer Neg Hx   . Kidney cancer Neg Hx   . Bladder Cancer Neg Hx     Social History:  reports that she has never smoked. She has never used smokeless tobacco. She reports that she does not drink alcohol or use drugs.  ROS: UROLOGY Frequent Urination?: No Hard to postpone urination?: No Burning/pain with urination?: No Get up at night to urinate?: No Leakage of urine?: No Urine stream starts and stops?: No Trouble starting stream?: No Do you have to strain to urinate?: No Blood in urine?: No Urinary tract infection?: No Sexually transmitted disease?: No Injury to kidneys or bladder?: No Painful intercourse?: No Weak stream?: No Currently pregnant?: No Vaginal bleeding?: No Last menstrual period?: n  Gastrointestinal Nausea?: No Vomiting?: No Indigestion/heartburn?: No Diarrhea?: No Constipation?: No  Constitutional Fever: No Night sweats?: No Weight loss?: No Fatigue?: No  Skin Skin rash/lesions?: No Itching?: No  Eyes Blurred vision?: No Double vision?: No  Ears/Nose/Throat Sore throat?: No Sinus problems?: No  Hematologic/Lymphatic Swollen glands?: No Easy bruising?: No  Cardiovascular Leg swelling?: No Chest pain?: No  Respiratory Cough?:  No Shortness of breath?: No  Endocrine Excessive thirst?: No  Musculoskeletal Back pain?: No Joint pain?: No  Neurological Headaches?: No Dizziness?: No  Psychologic Depression?: No Anxiety?: No  Physical Exam: BP (!) 162/78   Pulse 61   Ht 5\' 6"  (1.676 m)   Wt 148 lb 14.4 oz (67.5 kg)   BMI 24.03 kg/m   Constitutional: Well nourished. Alert and oriented, No acute distress. HEENT: [place] AT, moist mucus membranes. Trachea midline, no masses. Cardiovascular: No clubbing, cyanosis, or edema. Respiratory: Normal respiratory effort, no increased work of breathing. Skin: No rashes, bruises or suspicious lesions. Lymph: No cervical or inguinal adenopathy. Neurologic: Grossly intact, no focal deficits, moving all 4 extremities. Psychiatric: Normal mood and affect.  Laboratory Data: Lab Results  Component Value Date  WBC 6.9 05/11/2016   HGB 11.7 (L) 05/11/2016   HCT 36.0 05/11/2016   MCV 96.1 05/11/2016   PLT 349 05/11/2016    Lab Results  Component Value Date   CREATININE 0.73 05/11/2016    Assessment & Plan:    1. Urinary retention  - Foley catheter exchanged today  - RTC in 1 month for catheter exchange  - cystoscopy will need to be performed in 09/2016  2. Bladder spasms  - Continue oxybutynin 5 mg 3 times a day when necessary for bladder spasms    3. Increase urine output  - Patient advised to cut her fluid intake and half  - Resolved   Return in about 1 month (around 07/15/2016) for cath exchange.  These notes generated with voice recognition software. I apologize for typographical errors.  Michiel Cowboy, PA-C  [facility] [address] [place], [place] [address] [phone]

## 2016-06-14 ENCOUNTER — Ambulatory Visit: Payer: Medicare Other | Admitting: Urology

## 2016-06-14 ENCOUNTER — Encounter: Payer: Self-pay | Admitting: Urology

## 2016-06-14 VITALS — BP 162/78 | HR 61 | Ht 66.0 in | Wt 148.9 lb

## 2016-06-14 DIAGNOSIS — R3589 Other polyuria: Secondary | ICD-10-CM

## 2016-06-14 DIAGNOSIS — R358 Other polyuria: Secondary | ICD-10-CM | POA: Diagnosis not present

## 2016-06-14 DIAGNOSIS — N3289 Other specified disorders of bladder: Secondary | ICD-10-CM

## 2016-06-14 DIAGNOSIS — R339 Retention of urine, unspecified: Secondary | ICD-10-CM

## 2016-06-14 NOTE — Progress Notes (Signed)
Cath Change/ Replacement  Patient is present today for a catheter change due to urinary retention.  8ml of water was removed from the balloon, a 16FR foley cath was removed with out difficulty.  Patient was cleaned and prepped in a sterile fashion with betadine and 2% lidocaine jelly was instilled into the urethra. A 16 FR foley cath was replaced into the bladder no complications were noted Urine return was noted 1ml and urine was yellow in color. The balloon was filled with 10ml of sterile water. A le bag was attached for drainage.  A night bag was also given to the patient and patient was given instruction on how to change from one bag to another. Patient was given proper instruction on catheter care.    Preformed by: Dallas Schimkeamona Williams CMA  Follow up: One Month

## 2016-07-17 ENCOUNTER — Ambulatory Visit (INDEPENDENT_AMBULATORY_CARE_PROVIDER_SITE_OTHER): Payer: Medicare Other

## 2016-07-17 VITALS — BP 151/97 | HR 137 | Ht 66.0 in | Wt 141.5 lb

## 2016-07-17 DIAGNOSIS — R339 Retention of urine, unspecified: Secondary | ICD-10-CM | POA: Diagnosis not present

## 2016-07-17 NOTE — Progress Notes (Signed)
Cath Change/ Replacement  Patient is present today for a catheter change due to urinary retention.  10ml of water was removed from the balloon, a 16FR foley cath was removed with out difficulty.  Patient was cleaned and prepped in a sterile fashion with betadine and 2% lidocaine jelly was instilled into the urethra. A 16FR foley cath was replaced into the bladder no complications were noted Urine return was noted in legbag and urine was yellow in color. The balloon was filled with 10ml of sterile water.  Performed by: C. Rana SnareLowe, CMA

## 2016-08-07 ENCOUNTER — Ambulatory Visit (INDEPENDENT_AMBULATORY_CARE_PROVIDER_SITE_OTHER): Payer: Medicare Other

## 2016-08-07 VITALS — BP 159/61 | HR 65 | Ht 66.0 in | Wt 145.4 lb

## 2016-08-07 DIAGNOSIS — R339 Retention of urine, unspecified: Secondary | ICD-10-CM

## 2016-08-07 NOTE — Progress Notes (Signed)
Patient came in with c/o of UTI. Pt only c/o bladder pressure but denies all other UTI symptoms. Explained to patient we would not treat for UTI due to chronic foley and colonization of bacteria. Irrigation was done to confirm catheter draining properly. Pt was instructed to keep upcoming appt for foley change 07/19, and to call if having new sx(s) or current sx worsens. Patient verbalized understanding.

## 2016-08-09 ENCOUNTER — Other Ambulatory Visit: Payer: Self-pay

## 2016-08-09 MED ORDER — OXYBUTYNIN CHLORIDE 5 MG PO TABS: 5.0000 mg | ORAL_TABLET | Freq: Three times a day (TID) | ORAL | 0 refills | Status: DC

## 2016-08-16 ENCOUNTER — Ambulatory Visit (INDEPENDENT_AMBULATORY_CARE_PROVIDER_SITE_OTHER): Payer: Medicare Other

## 2016-08-16 VITALS — BP 154/72

## 2016-08-16 DIAGNOSIS — R339 Retention of urine, unspecified: Secondary | ICD-10-CM

## 2016-08-16 NOTE — Progress Notes (Signed)
Cath Change/ Replacement  Patient is present today for a catheter change due to urinary retention.  10ml of water was removed from the balloon, a 16FR foley cath was removed with out difficulty.  Patient was cleaned and prepped in a sterile fashion with betadine and 2% lidocaine jelly was instilled into the urethra. A 16 FR foley cath was replaced into the bladder no complications were noted. Urine return was noted 50ml and urine was yellow in color. The balloon was filled with 10ml of sterile water. A leg bag was attached for drainage.   Patient was given proper instruction on catheter care.    Preformed by: C. Rana SnareLowe, CMA and Eligha BridegroomSarah Watts, CMA  Follow up: 1 month

## 2016-09-14 ENCOUNTER — Encounter: Payer: Self-pay | Admitting: Emergency Medicine

## 2016-09-14 ENCOUNTER — Emergency Department: Payer: Medicare Other

## 2016-09-14 ENCOUNTER — Emergency Department
Admission: EM | Admit: 2016-09-14 | Discharge: 2016-09-14 | Disposition: A | Discharge status: Home / Self Care | Payer: Medicare Other | Attending: Emergency Medicine | Admitting: Emergency Medicine

## 2016-09-14 ENCOUNTER — Ambulatory Visit (INDEPENDENT_AMBULATORY_CARE_PROVIDER_SITE_OTHER): Payer: Medicare Other

## 2016-09-14 VITALS — BP 149/80 | HR 71 | Ht 66.0 in | Wt 139.9 lb

## 2016-09-14 DIAGNOSIS — W010XXA Fall on same level from slipping, tripping and stumbling without subsequent striking against object, initial encounter: Secondary | ICD-10-CM | POA: Insufficient documentation

## 2016-09-14 DIAGNOSIS — Z7901 Long term (current) use of anticoagulants: Secondary | ICD-10-CM | POA: Diagnosis not present

## 2016-09-14 DIAGNOSIS — S50902A Unspecified superficial injury of left elbow, initial encounter: Secondary | ICD-10-CM | POA: Diagnosis not present

## 2016-09-14 DIAGNOSIS — Y939 Activity, unspecified: Secondary | ICD-10-CM | POA: Diagnosis not present

## 2016-09-14 DIAGNOSIS — Y929 Unspecified place or not applicable: Secondary | ICD-10-CM | POA: Insufficient documentation

## 2016-09-14 DIAGNOSIS — Z7984 Long term (current) use of oral hypoglycemic drugs: Secondary | ICD-10-CM | POA: Diagnosis not present

## 2016-09-14 DIAGNOSIS — E119 Type 2 diabetes mellitus without complications: Secondary | ICD-10-CM | POA: Diagnosis not present

## 2016-09-14 DIAGNOSIS — S62102A Fracture of unspecified carpal bone, left wrist, initial encounter for closed fracture: Secondary | ICD-10-CM

## 2016-09-14 DIAGNOSIS — I10 Essential (primary) hypertension: Secondary | ICD-10-CM | POA: Insufficient documentation

## 2016-09-14 DIAGNOSIS — Y999 Unspecified external cause status: Secondary | ICD-10-CM | POA: Insufficient documentation

## 2016-09-14 DIAGNOSIS — S52612A Displaced fracture of left ulna styloid process, initial encounter for closed fracture: Secondary | ICD-10-CM | POA: Insufficient documentation

## 2016-09-14 DIAGNOSIS — Z79899 Other long term (current) drug therapy: Secondary | ICD-10-CM | POA: Insufficient documentation

## 2016-09-14 DIAGNOSIS — R339 Retention of urine, unspecified: Secondary | ICD-10-CM | POA: Diagnosis not present

## 2016-09-14 DIAGNOSIS — E039 Hypothyroidism, unspecified: Secondary | ICD-10-CM | POA: Insufficient documentation

## 2016-09-14 DIAGNOSIS — S60912A Unspecified superficial injury of left wrist, initial encounter: Secondary | ICD-10-CM | POA: Diagnosis present
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
## 2016-09-14 NOTE — Discharge Instructions (Signed)
Advised extra strength Tylenol as needed for pain. °

## 2016-09-14 NOTE — Progress Notes (Signed)
Cath Change/ Replacement  Patient is present today for a catheter change due to urinary retention.  89ml of water was removed from the balloon, a 16FR foley cath was removed with out difficulty.  Patient was cleaned and prepped in a sterile fashion with betadine and 2% lidocaine jelly was instilled into the urethra. A 16 FR foley cath was replaced into the bladder no complications were noted Urine return was noted 43ml and urine was yellow in color. The balloon was filled with 76ml of sterile water. A leg bag was attached for drainage.  A night bag was also given to the patient and patient was given instruction on how to change from one bag to another. Patient was given proper instruction on catheter care.    Preformed by: Rupert Stacks, LPN   Follow up: Pt will f/u with Carollee Herter next month.
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
## 2016-09-14 NOTE — ED Notes (Signed)
Pt discharged home after verbalizing understanding of discharge instructions; nad noted. 

## 2016-10-12 NOTE — Progress Notes (Signed)
10/15/2016 11:44 AM   Yolanda Cantu [birth year] [id number]  Referring provider: Marisue Ivan, MD [phone] [facility] [address] [facility] [place], [place] [address]  No chief complaint on file.   HPI: 83 yo WF who presents today for a one month follow up for bladder spasms and urinary retention managed with an indwelling Foley.    Background history Patient with chronic urinary retention since a right hip fracture.  Patient has failed several TOV's and has chosen to manage her retention with an indwelling Foley.  She was seen in the [facility] ED on 05/12/2016 for suprapubic pressure and draining more urine than usual.  She was diagnosed with an UTI, given Keflex and discharged to home.  Urine culture from the emergency room was positive for multiple species present.  Today, she still complains of the suprapubic pressure and draining more urine than usual.  She is experiencing leakage around the catheter.  Urine in the catheter is clear and yellow.  She states that she is drinking sixteen 8 ounces bottles of water daily.   She is not having gross hematuria. She denies fever, chills, nausea and vomiting.  Today, she is not having any urinary complaints at this time.  She denies any dysuria, gross hematuria or suprapubic pain. She denies any fever, chills, nausea and vomiting.  She continues to take the oxybutynin for bladder spasms and this has been successful for her.  PMH: Past Medical History:  Diagnosis Date  . Arthritis    knees  . Atrial fibrillation (HCC)   . Diabetes mellitus without complication (HCC)   . Hypercholesteremia   . Hypertension   . Hypothyroidism   . Thyroid disease   . Wears dentures    partial lower    Surgical History: Past Surgical History:  Procedure Laterality Date  . ABDOMINAL HYSTERECTOMY    . APPENDECTOMY    . CATARACT EXTRACTION W/PHACO Left 05/09/2015   Procedure: CATARACT EXTRACTION PHACO AND INTRAOCULAR LENS PLACEMENT (IOC) left eye;   Surgeon: Sherald Hess, MD;  Location: [facility];  Service: Ophthalmology;  Laterality: Left;  DIABETIC - oral meds per pt 8 or after for arrival time  . CHOLECYSTECTOMY    . INTRAMEDULLARY (IM) NAIL INTERTROCHANTERIC Right 09/25/2015   Procedure: INTRAMEDULLARY (IM) NAIL INTERTROCHANTRIC;  Surgeon: Juanell Fairly, MD;  Location: [facility] ORS;  Service: Orthopedics;  Laterality: Right;  . SPLENECTOMY    . SPLENECTOMY    . TONSILLECTOMY    . TOTAL KNEE ARTHROPLASTY Left     Home Medications:  Allergies as of 10/15/2016   No Known Allergies     Medication List       Accurate as of 10/15/16 11:44 AM. Always use your most recent med list.          acetaminophen 500 MG tablet Commonly known as:  TYLENOL Take 500 mg by mouth every 6 (six) hours as needed.   atorvastatin 40 MG tablet Commonly known as:  LIPITOR Take 20 mg by mouth daily.   diltiazem 240 MG 24 hr capsule Commonly known as:  TIAZAC Take 240 mg by mouth daily.   diltiazem 240 MG 24 hr capsule Commonly known as:  CARDIZEM CD TAKE 1 CAPSULE BY MOUTH ONCE A DAY   ferrous sulfate 325 (65 FE) MG tablet Take by mouth.   furosemide 40 MG tablet Commonly known as:  LASIX Take by mouth.   latanoprost 0.005 % ophthalmic solution Commonly known as:  XALATAN Place 1 drop into  both eyes at bedtime.   levothyroxine 125 MCG tablet Commonly known as:  SYNTHROID, LEVOTHROID Take 125 mcg by mouth daily before breakfast.   metFORMIN 500 MG tablet Commonly known as:  GLUCOPHAGE Take 250 mg by mouth 2 (two) times daily with a meal.   oxybutynin 5 MG tablet Commonly known as:  DITROPAN Take 1 tablet (5 mg total) by mouth 3 (three) times daily.   senna 8.6 MG tablet Commonly known as:  SENOKOT Take by mouth.   timolol 0.5 % ophthalmic solution Commonly known as:  BETIMOL Place 1 drop into both eyes daily.   warfarin 2 MG tablet Commonly known as:  COUMADIN Take 2 mg by mouth daily.             Discharge Care Instructions        Start     Ordered   10/15/16 0000  oxybutynin (DITROPAN) 5 MG tablet  3 times daily    Question:  Supervising Provider  Answer:  Vanna Scotland   10/15/16 1143      Allergies: No Known Allergies  Family History: Family History  Problem Relation Age of Onset  . Diabetes Father   . Stroke Father   . Lung cancer Brother   . Heart disease Mother   . Lung cancer Sister   . Prostate cancer Neg Hx   . Kidney cancer Neg Hx   . Bladder Cancer Neg Hx     Social History:  reports that she has never smoked. She has never used smokeless tobacco. She reports that she does not drink alcohol or use drugs.  ROS: UROLOGY Frequent Urination?: No Hard to postpone urination?: No Burning/pain with urination?: No Get up at night to urinate?: No Leakage of urine?: No Urine stream starts and stops?: No Trouble starting stream?: No Do you have to strain to urinate?: No Blood in urine?: No Urinary tract infection?: No Sexually transmitted disease?: No Injury to kidneys or bladder?: No Painful intercourse?: No Weak stream?: No Currently pregnant?: No Vaginal bleeding?: No Last menstrual period?: n  Gastrointestinal Nausea?: No Vomiting?: No Indigestion/heartburn?: No Diarrhea?: No Constipation?: No  Constitutional Fever: No Night sweats?: No Weight loss?: No Fatigue?: No  Skin Skin rash/lesions?: No Itching?: No  Eyes Blurred vision?: No Double vision?: No  Ears/Nose/Throat Sore throat?: No Sinus problems?: No  Hematologic/Lymphatic Swollen glands?: No Easy bruising?: No  Cardiovascular Leg swelling?: No Chest pain?: No  Respiratory Cough?: No Shortness of breath?: No  Endocrine Excessive thirst?: No  Musculoskeletal Back pain?: No Joint pain?: No  Neurological Headaches?: No Dizziness?: No  Psychologic Depression?: No Anxiety?: No  Physical Exam: BP (!) 164/83 (BP Location: Right Arm, Patient  Position: Sitting, Cuff Size: Normal)   Pulse 61   Ht  (1.676 m)   Wt 136 lb 11.2 oz (62 kg)   BMI 22.06 kg/m   Constitutional: Well nourished. Alert and oriented, No acute distress. HEENT: [place] AT, moist mucus membranes. Trachea midline, no masses. Cardiovascular: No clubbing, cyanosis, or edema. Respiratory: Normal respiratory effort, no increased work of breathing. Skin: No rashes, bruises or suspicious lesions. Lymph: No cervical or inguinal adenopathy. Neurologic: Grossly intact, no focal deficits, moving all 4 extremities. Psychiatric: Normal mood and affect.  Laboratory Data: Lab Results  Component Value Date   WBC 6.9 05/11/2016   HGB 11.7 (L) 05/11/2016   HCT 36.0 05/11/2016   MCV 96.1 05/11/2016   PLT 349 05/11/2016    Lab Results  Component Value Date  CREATININE 0.73 05/11/2016   I have reviewed the labs  Assessment & Plan:    1. Urinary retention  - Foley catheter exchanged today  - schedule cystoscopy   2. Bladder spasms  - Continue oxybutynin 5 mg 3 times a day when necessary for bladder spasms     Return for cystoscopy.  These notes generated with voice recognition software. I apologize for typographical errors.  Michiel Cowboy, PA-C  [facility] [address] [place], [place] [address] [phone]

## 2016-10-15 ENCOUNTER — Encounter: Payer: Self-pay | Admitting: Urology

## 2016-10-15 ENCOUNTER — Ambulatory Visit (INDEPENDENT_AMBULATORY_CARE_PROVIDER_SITE_OTHER): Payer: Medicare Other | Admitting: Urology

## 2016-10-15 VITALS — BP 164/83 | HR 61 | Ht 66.0 in | Wt 136.7 lb

## 2016-10-15 DIAGNOSIS — R339 Retention of urine, unspecified: Secondary | ICD-10-CM | POA: Diagnosis not present

## 2016-10-15 DIAGNOSIS — N3289 Other specified disorders of bladder: Secondary | ICD-10-CM

## 2016-10-15 MED ORDER — OXYBUTYNIN CHLORIDE 5 MG PO TABS: 5.0000 mg | ORAL_TABLET | Freq: Three times a day (TID) | ORAL | 0 refills | Status: DC

## 2016-10-15 NOTE — Progress Notes (Signed)
Cath Change/ Replacement  Patient is present today for a catheter change due to urinary retention.  10ml of water was removed from the balloon, a 16FR foley cath was removed with out difficulty.  Patient was cleaned and prepped in a sterile fashion with betadine and 2% lidocaine jelly was instilled into the urethra. A 16 FR foley cath was replaced into the bladder no complications were noted. Urine return was noted, urine was yellow in color. The balloon was filled with 10ml of sterile water. A leg bag was attached for drainage.  Patient was given proper instruction on catheter care.    Preformed by: C. Rana Snare, CMA  Follow up: 1 month

## 2016-11-12 ENCOUNTER — Emergency Department
Admission: EM | Admit: 2016-11-12 | Discharge: 2016-11-12 | Disposition: A | Discharge status: Home / Self Care | Payer: Medicare Other | Attending: Emergency Medicine | Admitting: Emergency Medicine

## 2016-11-12 ENCOUNTER — Encounter: Payer: Self-pay | Admitting: Emergency Medicine

## 2016-11-12 DIAGNOSIS — I10 Essential (primary) hypertension: Secondary | ICD-10-CM | POA: Insufficient documentation

## 2016-11-12 DIAGNOSIS — Z96652 Presence of left artificial knee joint: Secondary | ICD-10-CM | POA: Insufficient documentation

## 2016-11-12 DIAGNOSIS — R339 Retention of urine, unspecified: Secondary | ICD-10-CM | POA: Diagnosis not present

## 2016-11-12 DIAGNOSIS — Y658 Other specified misadventures during surgical and medical care: Secondary | ICD-10-CM | POA: Insufficient documentation

## 2016-11-12 DIAGNOSIS — R1030 Lower abdominal pain, unspecified: Secondary | ICD-10-CM | POA: Diagnosis present

## 2016-11-12 DIAGNOSIS — Z7984 Long term (current) use of oral hypoglycemic drugs: Secondary | ICD-10-CM | POA: Insufficient documentation

## 2016-11-12 DIAGNOSIS — T83091A Other mechanical complication of indwelling urethral catheter, initial encounter: Secondary | ICD-10-CM

## 2016-11-12 DIAGNOSIS — E119 Type 2 diabetes mellitus without complications: Secondary | ICD-10-CM | POA: Insufficient documentation

## 2016-11-12 DIAGNOSIS — N390 Urinary tract infection, site not specified: Secondary | ICD-10-CM

## 2016-11-12 DIAGNOSIS — Z7901 Long term (current) use of anticoagulants: Secondary | ICD-10-CM | POA: Diagnosis not present

## 2016-11-12 DIAGNOSIS — Z79899 Other long term (current) drug therapy: Secondary | ICD-10-CM | POA: Insufficient documentation

## 2016-11-12 DIAGNOSIS — E039 Hypothyroidism, unspecified: Secondary | ICD-10-CM | POA: Diagnosis not present

## 2016-11-12 LAB — URINALYSIS, COMPLETE (UACMP) WITH MICROSCOPIC
BILIRUBIN URINE: NEGATIVE
Bacteria, UA: NONE SEEN
GLUCOSE, UA: NEGATIVE mg/dL
KETONES UR: NEGATIVE mg/dL
Nitrite: NEGATIVE
PROTEIN: 100 mg/dL — AB
Specific Gravity, Urine: 1.009 (ref 1.005–1.030)
Squamous Epithelial / LPF: NONE SEEN
pH: 9 — ABNORMAL HIGH (ref 5.0–8.0)

## 2016-11-12 MED ORDER — CEPHALEXIN 500 MG PO CAPS: 500.0000 mg | ORAL_CAPSULE | Freq: Two times a day (BID) | ORAL | 0 refills | Status: DC

## 2016-11-12 NOTE — ED Provider Notes (Signed)
[facility] Emergency Department Provider Note   ____________________________________________    I have reviewed the triage vital signs and the nursing notes.   HISTORY  Chief Complaint Urinary Retention     HPI Yolanda Cantu is a 83 y.o. female Who presents with complaints of urinary retention. Patient reports chronic Foley catheter which she has changed every 4 weeks. She reports she woke up this morning with an empty Foley bag and feeling like her bladder was extremely full. She denies fevers or chills. Has been feeling well and has no other physical complaints besides bladder fullness today. Has not taken anything for this or manipulated the Foley   Past Medical History:  Diagnosis Date  . Arthritis    knees  . Atrial fibrillation (HCC)   . Diabetes mellitus without complication (HCC)   . Hypercholesteremia   . Hypertension   . Hypothyroidism   . Thyroid disease   . Wears dentures    partial lower    Patient Active Problem List   Diagnosis Date Noted  . Closed right hip fracture (HCC) 09/23/2015  . Diabetes (HCC) 09/23/2015  . HTN (hypertension) 09/23/2015  . Hypothyroidism 09/23/2015  . Atrial fibrillation (HCC) 09/23/2015  . Pure hypercholesterolemia 10/18/2014  . B12 deficiency 08/24/2013  . Cobalamin deficiency 08/24/2013    Past Surgical History:  Procedure Laterality Date  . ABDOMINAL HYSTERECTOMY    . APPENDECTOMY    . CATARACT EXTRACTION W/PHACO Left 05/09/2015   Procedure: CATARACT EXTRACTION PHACO AND INTRAOCULAR LENS PLACEMENT (IOC) left eye;  Surgeon: Sherald Hess, MD;  Location: [facility];  Service: Ophthalmology;  Laterality: Left;  DIABETIC - oral meds per pt 8 or after for arrival time  . CHOLECYSTECTOMY    . INTRAMEDULLARY (IM) NAIL INTERTROCHANTERIC Right 09/25/2015   Procedure: INTRAMEDULLARY (IM) NAIL INTERTROCHANTRIC;  Surgeon: Juanell Fairly, MD;  Location: [facility] ORS;  Service:  Orthopedics;  Laterality: Right;  . SPLENECTOMY    . SPLENECTOMY    . TONSILLECTOMY    . TOTAL KNEE ARTHROPLASTY Left     Prior to Admission medications   Medication Sig Start Date End Date Taking? Authorizing Provider  acetaminophen (TYLENOL) 500 MG tablet Take 500 mg by mouth every 6 (six) hours as needed.    [provider]  atorvastatin (LIPITOR) 40 MG tablet Take 20 mg by mouth daily.    [provider]  cephALEXin (KEFLEX) 500 MG capsule Take 1 capsule (500 mg total) by mouth 2 (two) times daily. 11/12/16   Jene Every, MD  diltiazem (CARDIZEM CD) 240 MG 24 hr capsule TAKE 1 CAPSULE BY MOUTH ONCE A DAY 01/16/16   [provider]  diltiazem (TIAZAC) 240 MG 24 hr capsule Take 240 mg by mouth daily.    [provider]  ferrous sulfate 325 (65 FE) MG tablet Take by mouth.    [provider]  furosemide (LASIX) 40 MG tablet Take by mouth. 10/18/15   [provider]  latanoprost (XALATAN) 0.005 % ophthalmic solution Place 1 drop into both eyes at bedtime.    [provider]  levothyroxine (SYNTHROID, LEVOTHROID) 125 MCG tablet Take 125 mcg by mouth daily before breakfast.    [provider]  metFORMIN (GLUCOPHAGE) 500 MG tablet Take 250 mg by mouth 2 (two) times daily with a meal.    [provider]  oxybutynin (DITROPAN) 5 MG tablet Take 1 tablet (5 mg total) by mouth 3 (three) times daily. 10/15/16  McGowan, Shannon A, PA-C  senna (SENOKOT) 8.6 MG tablet Take by mouth.    [provider]  timolol (BETIMOL) 0.5 % ophthalmic solution Place 1 drop into both eyes daily.    [provider]  warfarin (COUMADIN) 2 MG tablet Take 2 mg by mouth daily.    [provider]     Allergies Patient has no known allergies.  Family History  Problem Relation Age of Onset  . Diabetes Father   . Stroke Father   . Lung cancer Brother   . Heart disease Mother   . Lung cancer Sister   . Prostate  cancer Neg Hx   . Kidney cancer Neg Hx   . Bladder Cancer Neg Hx     Social History Social History  Substance Use Topics  . Smoking status: Never Smoker  . Smokeless tobacco: Never Used  . Alcohol use No    Review of Systems  Constitutional: No fever/chills Eyes: No visual changes.  ENT: No neck pain Cardiovascular: Denies chest pain. Respiratory: Denies shortness of breath. Gastrointestinal: suprapubic pressure. No nausea, no vomiting.   Genitourinary: as above Musculoskeletal: Negative for back pain. Skin: Negative for rash. Neurological: Negative for headaches    ____________________________________________   PHYSICAL EXAM:  VITAL SIGNS: ED Triage Vitals  Enc Vitals Group     BP 11/12/16 0718 (!) 182/69     Pulse Rate 11/12/16 0718 68     Resp 11/12/16 0718 15     Temp 11/12/16 0718 98 F (36.7 C)     Temp Source 11/12/16 0718 Oral     SpO2 11/12/16 0718 100 %     Weight 11/12/16 0719 61.7 kg (136 lb)     Height 11/12/16 0719 1.676 m ( )     Head Circumference --      Peak Flow --      Pain Score 11/12/16 0718 0     Pain Loc --      Pain Edu? --      Excl. in GC? --     Constitutional: Alert and oriented. No acute distress. Pleasant and interactive   Cardiovascular:  Good peripheral circulation. Respiratory: Normal respiratory effort.  Gastrointestinal: Soft and nontender. mild suprapubic distention.  No CVA tenderness. Genitourinary: empty Foley catheterback Musculoskeletal:   Warm and well perfused Neurologic:  Normal speech and language. No gross focal neurologic deficits are appreciated.  Skin:  Skin is warm, dry and intact. No rash noted. Psychiatric: Mood and affect are normal. Speech and behavior are normal.  ____________________________________________   LABS (all labs ordered are listed, but only abnormal results are displayed)  Labs Reviewed  URINALYSIS, COMPLETE (UACMP) WITH MICROSCOPIC - Abnormal; Notable for the following:        Result Value   Color, Urine AMBER (*)    APPearance CLOUDY (*)    pH 9.0 (*)    Hgb urine dipstick MODERATE (*)    Protein, ur 100 (*)    Leukocytes, UA MODERATE (*)    All other components within normal limits  URINE CULTURE   ____________________________________________  EKG  None ____________________________________________  RADIOLOGY  None ____________________________________________   PROCEDURES  Procedure(s) performed: No    Critical Care performed: No ____________________________________________   INITIAL IMPRESSION / ASSESSMENT AND PLAN / ED COURSE  Pertinent labs & imaging results that were available during my care of the patient were reviewed by me and considered in my medical decision making (see chart for details).  suspect catheter blockage.  Bladder scan shows 800 cc of urine.  Foley replaced by nurse with complete resolution of symptoms and significant output. We'll check urinalysis but anticipate discharge    ____________________________________________   FINAL CLINICAL IMPRESSION(S) / ED DIAGNOSES  Final diagnoses:  Urinary retention  Obstruction of Foley catheter, initial encounter (HCC)  Lower urinary tract infectious disease      NEW MEDICATIONS STARTED DURING THIS VISIT:  Discharge Medication List as of 11/12/2016  8:56 AM    START taking these medications   Details  cephALEXin (KEFLEX) 500 MG capsule Take 1 capsule (500 mg total) by mouth 2 (two) times daily., Starting Mon 11/12/2016, Print         Note:  This document was prepared using Dragon voice recognition software and may include unintentional dictation errors.    Jene Every, MD 11/12/16 [phone]

## 2016-11-12 NOTE — ED Notes (Signed)
Pt verbalizes understanding of d/c teaching and rx given. Pt in NAD at time of d/c. Pt in wheelchair to lobby with ride.

## 2016-11-12 NOTE — ED Triage Notes (Signed)
Patient presents to the ED with complaint of catheter problems.  Patient reports her urine is not draining well into her leg bag this morning.  Patient states, "I think my urine is backing up in my bladder."  Patient is in no obvious discomfort at this time.

## 2016-11-12 NOTE — ED Notes (Signed)
Bladder scan shows >800 mL of urine

## 2016-11-13 LAB — URINE CULTURE

## 2016-11-14 ENCOUNTER — Telehealth: Payer: Self-pay

## 2016-11-14 NOTE — Telephone Encounter (Signed)
Pt walked in stating she was seen in the ER Monday 11/12/16 for a UTI. Pt stated that the ER changed her foley for her and started her on abx. Pt voiced concern about having a cysto while on an abx for an infection. Reinforced with pt that due to foley her ucx would always be positive and could still have cysto on Friday. Pt was very persistent that her appt be changed until November. Oct cysto appt was cancelled. Nov appt made with Dr. Apolinar JunesBrandon for cysto and cath change. Pt voiced understanding.

## 2016-11-16 ENCOUNTER — Other Ambulatory Visit: Payer: Medicare Other | Admitting: Urology

## 2016-12-18 ENCOUNTER — Encounter: Payer: Self-pay | Admitting: Urology

## 2016-12-18 ENCOUNTER — Ambulatory Visit (INDEPENDENT_AMBULATORY_CARE_PROVIDER_SITE_OTHER): Payer: Medicare Other | Admitting: Urology

## 2016-12-18 VITALS — BP 157/108 | HR 63 | Ht 66.0 in | Wt 130.2 lb

## 2016-12-18 DIAGNOSIS — R339 Retention of urine, unspecified: Secondary | ICD-10-CM

## 2016-12-18 MED ORDER — LIDOCAINE HCL 2 % EX GEL
1.0000 "application " | Freq: Once | CUTANEOUS | Status: AC
  Administered 2016-12-18: 1 via URETHRAL

## 2016-12-18 MED ORDER — CIPROFLOXACIN HCL 500 MG PO TABS
500.0000 mg | ORAL_TABLET | Freq: Once | ORAL | Status: AC
  Administered 2016-12-18: 500 mg via ORAL

## 2016-12-18 NOTE — Progress Notes (Signed)
Cath Change/ Replacement  Patient is present today for a catheter change due to urinary retention.  10ml of water was removed from the balloon, a 16FR foley cath was removed with out difficulty.  Patient was cleaned and prepped in a sterile fashion with betadine and 2% lidocaine jelly was instilled into the urethra. A 16FR foley cath was replaced into the bladder by Dr. Apolinar JunesBrandon.   Urine return was noted ml and urine was yellow in color. The balloon was filled with 10ml of sterile water. A leg bag was attached for drainage. Patient was given proper instruction on catheter care.

## 2016-12-24 NOTE — Progress Notes (Signed)
   12/24/16  CC:  Chief Complaint  Patient presents with  . Cysto    HPI: 83 year old female with a chronic indwelling catheter who presents today for annual cystoscopy.  She developed chronic urinary retention after right hip fracture and failed multiple voiding trials.  Overall, she is tolerating the catheter well and has had minimal issues.  Blood pressure (!) 157/108, pulse 63, height 5\' 6"  (1.676 m), weight 130 lb 3.2 oz (59.1 kg). NED. A&Ox3.   No respiratory distress   Abd soft, NT, ND Normal external genitalia with patent urethral meatus  Cystoscopy Procedure Note  Patient identification was confirmed, informed consent was obtained, and patient was prepped using Betadine solution.  Lidocaine jelly was administered per urethral meatus.    Preoperative abx where received prior to procedure.    Procedure: - Flexible cystoscope introduced, without any difficulty.   - Thorough search of the bladder revealed:    normal urethral meatus    normal urothelium with slight edema on the posterior wall consistent with catheter cystitis    no stones    no ulcers     no tumors    no urethral polyps    no trabeculation  - Ureteral orifices were normal in position and appearance.  Post-Procedure: - Patient tolerated the procedure well  Assessment/ Plan:  1. Urinary retention Unremarkable cystoscopy today Continue monthly catheter exchanges Recommend continued annual upper tract imaging with renal ultrasound and cystoscopy in 1 year Upper tract imaging deferred this year as this is her first year with a catheter-creatinine stable - lidocaine (XYLOCAINE) 2 % jelly 1 application - ciprofloxacin (CIPRO) tablet 500 mg   Vanna ScotlandAshley Victorina Kable, MD

## 2017-01-05 ENCOUNTER — Other Ambulatory Visit: Payer: Self-pay

## 2017-01-05 ENCOUNTER — Encounter: Payer: Self-pay | Admitting: Emergency Medicine

## 2017-01-05 ENCOUNTER — Emergency Department
Admission: EM | Admit: 2017-01-05 | Discharge: 2017-01-05 | Disposition: A | Discharge status: Home / Self Care | Payer: Medicare Other | Attending: Emergency Medicine | Admitting: Emergency Medicine

## 2017-01-05 DIAGNOSIS — Y731 Therapeutic (nonsurgical) and rehabilitative gastroenterology and urology devices associated with adverse incidents: Secondary | ICD-10-CM | POA: Diagnosis not present

## 2017-01-05 DIAGNOSIS — Z96652 Presence of left artificial knee joint: Secondary | ICD-10-CM | POA: Diagnosis not present

## 2017-01-05 DIAGNOSIS — E119 Type 2 diabetes mellitus without complications: Secondary | ICD-10-CM | POA: Diagnosis not present

## 2017-01-05 DIAGNOSIS — T839XXA Unspecified complication of genitourinary prosthetic device, implant and graft, initial encounter: Secondary | ICD-10-CM | POA: Diagnosis present

## 2017-01-05 DIAGNOSIS — R339 Retention of urine, unspecified: Secondary | ICD-10-CM | POA: Insufficient documentation

## 2017-01-05 DIAGNOSIS — I10 Essential (primary) hypertension: Secondary | ICD-10-CM | POA: Insufficient documentation

## 2017-01-05 DIAGNOSIS — E039 Hypothyroidism, unspecified: Secondary | ICD-10-CM | POA: Diagnosis not present

## 2017-01-05 LAB — URINALYSIS, COMPLETE (UACMP) WITH MICROSCOPIC
BACTERIA UA: NONE SEEN
Bilirubin Urine: NEGATIVE
Glucose, UA: NEGATIVE mg/dL
Ketones, ur: NEGATIVE mg/dL
Nitrite: NEGATIVE
PROTEIN: 30 mg/dL — AB
Specific Gravity, Urine: 1.01 (ref 1.005–1.030)
pH: 7 (ref 5.0–8.0)

## 2017-01-05 MED ORDER — CEPHALEXIN 250 MG PO CAPS: 250.0000 mg | ORAL_CAPSULE | Freq: Four times a day (QID) | ORAL | 0 refills | Status: AC

## 2017-01-05 NOTE — ED Notes (Signed)
Pt went to the urologist on the 17th and had a catheter placed post cystography. Pt has had pressure on her bladder the last few days and urine leaking around the catheter. No fever or pain.

## 2017-01-05 NOTE — ED Provider Notes (Signed)
[facility] Emergency Department Provider Note       Time seen: ----------------------------------------- 4:10 PM on 01/05/2017 -----------------------------------------   I have reviewed the triage vital signs and the nursing notes.  HISTORY   Chief Complaint foley catheter leaking    HPI Yolanda Cantu is a 83 y.o. female with a history of a chronic indwelling Foley catheter for 15 months who presents to the ED for leakage around her Foley catheter.  She is also had some pelvic pressure but states urine has been coming out of her Foley catheter as well as around the Foley catheter.  She denies fevers or chills, does have a history of UTI.  Reportedly she had cystoscopy done within the past month which looked normal.  Past Medical History:  Diagnosis Date  . Arthritis    knees  . Atrial fibrillation (HCC)   . Diabetes mellitus without complication (HCC)   . Hypercholesteremia   . Hypertension   . Hypothyroidism   . Thyroid disease   . Wears dentures    partial lower    Patient Active Problem List   Diagnosis Date Noted  . Urinary retention 12/18/2016  . Closed right hip fracture (HCC) 09/23/2015  . Diabetes (HCC) 09/23/2015  . HTN (hypertension) 09/23/2015  . Hypothyroidism 09/23/2015  . Atrial fibrillation (HCC) 09/23/2015  . Pure hypercholesterolemia 10/18/2014  . B12 deficiency 08/24/2013  . Cobalamin deficiency 08/24/2013    Past Surgical History:  Procedure Laterality Date  . ABDOMINAL HYSTERECTOMY    . APPENDECTOMY    . CATARACT EXTRACTION W/PHACO Left 05/09/2015   Procedure: CATARACT EXTRACTION PHACO AND INTRAOCULAR LENS PLACEMENT (IOC) left eye;  Surgeon: Sherald HessAnita Prakash Vin-Parikh, MD;  [facility];  Service: Ophthalmology;  Laterality: Left;  DIABETIC - oral meds per pt 8 or after for arrival time  . CHOLECYSTECTOMY    . INTRAMEDULLARY (IM) NAIL INTERTROCHANTERIC Right 09/25/2015   Procedure: INTRAMEDULLARY (IM)  NAIL INTERTROCHANTRIC;  Surgeon: Juanell FairlyKevin Krasinski, MD;  Location: [facility] ORS;  Service: Orthopedics;  Laterality: Right;  . SPLENECTOMY    . SPLENECTOMY    . TONSILLECTOMY    . TOTAL KNEE ARTHROPLASTY Left     Allergies Patient has no known allergies.  Social History Social History   Tobacco Use  . Smoking status: Never Smoker  . Smokeless tobacco: Never Used  Substance Use Topics  . Alcohol use: No  . Drug use: No   Review of Systems Constitutional: Negative for fever. Cardiovascular: Negative for chest pain. Respiratory: Negative for shortness of breath. Gastrointestinal: Negative for abdominal pain, vomiting and diarrhea. Genitourinary: Positive for urinary pressure Musculoskeletal: Negative for back pain. Skin: Negative for rash. Neurological: Negative for headaches, focal weakness or numbness.  All systems negative/normal/unremarkable except as stated in the HPI  ____________________________________________   PHYSICAL EXAM:  VITAL SIGNS: ED Triage Vitals  Enc Vitals Group     BP 01/05/17 1434 (!) 162/79     Pulse Rate 01/05/17 1434 88     Resp 01/05/17 1434 16     Temp 01/05/17 1434 98.2 F (36.8 C)     Temp Source 01/05/17 1434 Oral     SpO2 01/05/17 1434 100 %     Weight 01/05/17 1433 130 lb (59 kg)     Height 01/05/17 1433 5\' 6"  (1.676 m)     Head Circumference --      Peak Flow --      Pain Score --      Pain Loc --  Pain Edu? --      Excl. in GC? --    Constitutional: Alert and oriented. Well appearing and in no distress. Eyes: Conjunctivae are normal. Normal extraocular movements. Cardiovascular: Normal rate, regular rhythm. No murmurs, rubs, or gallops. Respiratory: Normal respiratory effort without tachypnea nor retractions. Breath sounds are clear and equal bilaterally. No wheezes/rales/rhonchi. Gastrointestinal: Soft and nontender. Normal bowel sounds Musculoskeletal: Nontender with normal range of motion in extremities. No lower extremity  tenderness nor edema. Neurologic:  Normal speech and language. No gross focal neurologic deficits are appreciated.  Skin:  Skin is warm, dry and intact. No rash noted. Psychiatric: Mood and affect are normal. Speech and behavior are normal.  ____________________________________________  ED COURSE:  Pertinent labs & imaging results that were available during my care of the patient were reviewed by me and considered in my medical decision making (see chart for details). Patient presents for Foley catheter dysfunction, we will assess with labs as indicated.  We will likely replace a Foley catheter.   Procedures ____________________________________________   LABS (pertinent positives/negatives)  Labs Reviewed  URINALYSIS, COMPLETE (UACMP) WITH MICROSCOPIC - Abnormal; Notable for the following components:      Result Value   Color, Urine YELLOW (*)    APPearance CLEAR (*)    Hgb urine dipstick MODERATE (*)    Protein, ur 30 (*)    Leukocytes, UA TRACE (*)    Squamous Epithelial / LPF 0-5 (*)    All other components within normal limits  URINE CULTURE  ____________________________________________  DIFFERENTIAL DIAGNOSIS   UTI, Foley catheter dysfunction, urinary retention, bladder prolapse  FINAL ASSESSMENT AND PLAN  Foley catheter dysfunction   Plan: Patient had presented for pelvic pressure and leaking around the Foley catheter. Patient's labs are borderline for UTI.  We placed an 2618 French Foley catheter here and send a urine culture.  I will place her on low-dose Keflex and advise close follow-up with her doctor in 2 days for recheck.   Emily FilbertWilliams, Areli Frary E, MD   Note: This note was generated in part or whole with voice recognition software. Voice recognition is usually quite accurate but there are transcription errors that can and very often do occur. I apologize for any typographical errors that were not detected and corrected.     Emily FilbertWilliams, Joah Patlan E, MD 01/05/17  [phone]

## 2017-01-05 NOTE — ED Triage Notes (Signed)
Patient has had a foley catheter for the past 14 months.  Arrives today c/o leaking around catheter. Since around noon.  Catheter last changed 12/15/16.

## 2017-01-05 NOTE — ED Notes (Signed)
ED Provider at bedside. 

## 2017-01-05 NOTE — ED Notes (Signed)
No leakage noted with new catheter. Pt tolerating well. MD to bedside. Leg bag attached.

## 2017-01-07 LAB — URINE CULTURE: Special Requests: NORMAL

## 2017-01-09 ENCOUNTER — Telehealth: Payer: Self-pay | Admitting: Urology

## 2017-01-09 NOTE — Telephone Encounter (Signed)
Patient went to the ER Saturday for leakage outside of her catheter they gave her Keflex 250 mg 4 times a day for 10 days. It is giving her bad diarrhea and she said he can't take it anymore. Please call  Her and advise her on what she needs to do.   Yolanda DusterMichelle

## 2017-01-10 NOTE — Telephone Encounter (Signed)
Spoke with patient per Yolanda Cantu , patient instructed ok to stop abx and to utilize her oxybutinin for bladder spasms and leakage. Patient verbalized understanding and will keep apt on Tuesday for follow up and possibly exchange cath at that visit due to upsizing at the ER causing discomfort.

## 2017-01-15 ENCOUNTER — Ambulatory Visit (INDEPENDENT_AMBULATORY_CARE_PROVIDER_SITE_OTHER): Payer: Medicare Other

## 2017-01-15 VITALS — BP 139/66 | HR 63 | Ht 66.0 in | Wt 125.0 lb

## 2017-01-15 DIAGNOSIS — R339 Retention of urine, unspecified: Secondary | ICD-10-CM

## 2017-01-15 NOTE — Progress Notes (Signed)
Cath Change/ Replacement  Patient is present today for a catheter change due to urinary retention.  10ml of water was removed from the balloon, a 18FR foley cath was removed with out difficulty.  Patient was cleaned and prepped in a sterile fashion with betadine and 2% lidocaine jelly was instilled into the urethra. A 16 FR foley cath was replaced into the bladder no complications were noted Urine return was noted 20ml and urine was yellow in color. The balloon was filled with 10ml of sterile water. A leg bag was attached for drainage.  A night bag was also given to the patient and patient was given instruction on how to change from one bag to another. Patient was given proper instruction on catheter care.    Preformed by: Rupert Stackshelsea Mahamadou Weltz, LPN   Follow up: 1 month  Blood pressure 139/66, pulse 63, height 5\' 6"  (1.676 m), weight 125 lb (56.7 kg).

## 2017-01-24 DIAGNOSIS — Z978 Presence of other specified devices: Secondary | ICD-10-CM | POA: Insufficient documentation

## 2017-02-13 ENCOUNTER — Ambulatory Visit (INDEPENDENT_AMBULATORY_CARE_PROVIDER_SITE_OTHER): Payer: Medicare Other

## 2017-02-13 VITALS — BP 150/72 | HR 78 | Ht 66.0 in | Wt 123.0 lb

## 2017-02-13 DIAGNOSIS — R339 Retention of urine, unspecified: Secondary | ICD-10-CM | POA: Diagnosis not present

## 2017-02-13 NOTE — Progress Notes (Signed)
Cath Change/ Replacement  Patient is present today for a catheter change due to urinary retention.  10ml of water was removed from the balloon, a 16FR foley cath was removed with out difficulty.  Patient was cleaned and prepped in a sterile fashion with betadine and 2% lidocaine jelly was instilled into the urethra. A 16 FR foley cath was replaced into the bladder no complications were noted Urine return was noted 10ml and urine was yellow in color. The balloon was filled with 10ml of sterile water. A leg bag was attached for drainage.  A night bag was also given to the patient and patient was given instruction on how to change from one bag to another. Patient was given proper instruction on catheter care.    Preformed by: Yolanda Stackshelsea Skylah Delauter, LPN   Follow up: 1 month  Blood pressure (!) 150/72, pulse 78, height 5\' 6"  (1.676 m), weight 123 lb (55.8 kg).

## 2017-03-13 ENCOUNTER — Ambulatory Visit (INDEPENDENT_AMBULATORY_CARE_PROVIDER_SITE_OTHER): Payer: Medicare Other

## 2017-03-13 VITALS — BP 157/79 | HR 56 | Ht 66.0 in | Wt 123.0 lb

## 2017-03-13 DIAGNOSIS — R339 Retention of urine, unspecified: Secondary | ICD-10-CM | POA: Diagnosis not present

## 2017-03-13 NOTE — Progress Notes (Signed)
Cath Change/ Replacement  Patient is present today for a catheter change due to urinary retention.  10ml of water was removed from the balloon, a 16FR foley cath was removed with out difficulty.  Patient was cleaned and prepped in a sterile fashion with betadine and 2% lidocaine jelly was instilled into the urethra. A 16 FR foley cath was replaced into the bladder no complications were noted Urine return was noted 20ml and urine was yellow in color. The balloon was filled with 10ml of sterile water. A leg bag was attached for drainage.  A night bag was also given to the patient and patient was given instruction on how to change from one bag to another. Patient was given proper instruction on catheter care.    Preformed by: Rupert Stackshelsea Tangia Pinard, LPN   Follow up: 1 month  Blood pressure (!) 157/79, pulse (!) 56, height 5\' 6"  (1.676 m), weight 123 lb (55.8 kg).

## 2017-04-10 ENCOUNTER — Ambulatory Visit (INDEPENDENT_AMBULATORY_CARE_PROVIDER_SITE_OTHER): Payer: Medicare Other

## 2017-04-10 VITALS — BP 188/79 | HR 50 | Ht 66.0 in | Wt 121.0 lb

## 2017-04-10 DIAGNOSIS — R339 Retention of urine, unspecified: Secondary | ICD-10-CM | POA: Diagnosis not present

## 2017-04-10 NOTE — Progress Notes (Signed)
Cath Change/ Replacement  Patient is present today for a catheter change due to urinary retention.  10ml of water was removed from the balloon, a 16FR foley cath was removed with out difficulty.  Patient was cleaned and prepped in a sterile fashion with betadine and 2% lidocaine jelly was instilled into the urethra. A 16 FR foley cath was replaced into the bladder no complications were noted Urine return was noted 10ml and urine was clear in color. The balloon was filled with 10ml of sterile water. A leg bag was attached for drainage.  A night bag was also given to the patient and patient was given instruction on how to change from one bag to another. Patient was given proper instruction on catheter care.    Preformed by: Yolanda Stackshelsea Watkins, LPN   Follow up: 1 month  Blood pressure (!) 188/79, pulse (!) 50, height 5\' 6"  (1.676 m), weight 121 lb (54.9 kg).

## 2017-05-07 NOTE — Progress Notes (Signed)
05/08/2017 11:23 AM   Yolanda Cantu Jul 21, 1925 [id number]  Referring provider: Marisue Ivan, MD [phone] [facility] [address] [facility] [place], [place] [address]  Chief Complaint  Patient presents with  . Urinary Retention    HPI: 84 yo WF who presents today for a one month follow up for bladder spasms and urinary retention managed with an indwelling Foley.    Background history Patient with chronic urinary retention since a right hip fracture.  Patient has failed several TOV's and has chosen to manage her retention with an indwelling Foley.  She was seen in the [facility] ED on 05/12/2016 for suprapubic pressure and draining more urine than usual.  She was diagnosed with an UTI, given Keflex and discharged to home.  Urine culture from the emergency room was positive for multiple species present.  Today, she still complains of the suprapubic pressure and draining more urine than usual.  She is experiencing leakage around the catheter.  Urine in the catheter is clear and yellow.  She states that she is drinking sixteen 8 ounces bottles of water daily.   She is not having gross hematuria. She denies fever, chills, nausea and vomiting.  She underwent cystoscopy evaluation with Dr. Apolinar Junes on 12/18/2016 which was negative.  Today, she is not having any urinary complaints at this time.  She denies any dysuria, gross hematuria or suprapubic pain. She denies any fever, chills, nausea and vomiting.    PMH: Past Medical History:  Diagnosis Date  . Arthritis    knees  . Atrial fibrillation (HCC)   . Diabetes mellitus without complication (HCC)   . Hypercholesteremia   . Hypertension   . Hypothyroidism   . Thyroid disease   . Wears dentures    partial lower    Surgical History: Past Surgical History:  Procedure Laterality Date  . ABDOMINAL HYSTERECTOMY    . APPENDECTOMY    . CATARACT EXTRACTION W/PHACO Left 05/09/2015   Procedure: CATARACT EXTRACTION PHACO AND INTRAOCULAR  LENS PLACEMENT (IOC) left eye;  Surgeon: Sherald Hess, MD;  Location: [facility];  Service: Ophthalmology;  Laterality: Left;  DIABETIC - oral meds per pt 8 or after for arrival time  . CHOLECYSTECTOMY    . INTRAMEDULLARY (IM) NAIL INTERTROCHANTERIC Right 09/25/2015   Procedure: INTRAMEDULLARY (IM) NAIL INTERTROCHANTRIC;  Surgeon: Juanell Fairly, MD;  Location: [facility] OR[facility];  Service: Orthopedics;  Laterality: Right;  . SPLENECTOMY    . SPLENECTOMY    . TONSILLECTOMY    . TOTAL KNEE ARTHROPLASTY Left     Home Medications:  Allergies as of 05/08/2017   No Known Allergies     Medication List        Accurate as of 05/08/17 11:23 AM. Always use your most recent med list.          acetaminophen 500 MG tablet Commonly known as:  TYLENOL Take 500 mg by mouth every 6 (six) hours as needed.   atorvastatin 40 MG tablet Commonly known as:  LIPITOR Take 20 mg by mouth daily.   diltiazem 240 MG 24 hr capsule Commonly known as:  CARDIZEM CD TAKE 1 CAPSULE BY MOUTH ONCE A DAY   latanoprost 0.005 % ophthalmic solution Commonly known as:  XALATAN Place 1 drop into both eyes at bedtime.   levothyroxine 125 MCG tablet Commonly known as:  SYNTHROID, LEVOTHROID Take 125 mcg by mouth daily before breakfast.   metFORMIN 500 MG tablet Commonly known as:  GLUCOPHAGE Take 250 mg by mouth  2 (two) times daily with a meal.   oxybutynin 5 MG tablet Commonly known as:  DITROPAN Take 1 tablet (5 mg total) by mouth 3 (three) times daily.   senna 8.6 MG tablet Commonly known as:  SENOKOT Take by mouth.   timolol 0.5 % ophthalmic solution Commonly known as:  BETIMOL Place 1 drop into both eyes daily.   warfarin 2 MG tablet Commonly known as:  COUMADIN Take 2 mg by mouth daily.       Allergies: No Known Allergies  Family History: Family History  Problem Relation Age of Onset  . Diabetes Father   . Stroke Father   . Lung cancer Brother   . Heart disease Mother   .  Lung cancer Sister   . Prostate cancer Neg Hx   . Kidney cancer Neg Hx   . Bladder Cancer Neg Hx     Social History:  reports that she has never smoked. She has never used smokeless tobacco. She reports that she does not drink alcohol or use drugs.  ROS: UROLOGY Frequent Urination?: No Hard to postpone urination?: No Burning/pain with urination?: No Get up at night to urinate?: No Leakage of urine?: No Urine stream starts and stops?: No Trouble starting stream?: No Do you have to strain to urinate?: No Blood in urine?: No Urinary tract infection?: No Sexually transmitted disease?: No Injury to kidneys or bladder?: No Painful intercourse?: No Weak stream?: No Currently pregnant?: No Vaginal bleeding?: No Last menstrual period?: n  Gastrointestinal Nausea?: No Vomiting?: No Indigestion/heartburn?: No Diarrhea?: No Constipation?: No  Constitutional Fever: No Night sweats?: No Weight loss?: No Fatigue?: No  Skin Skin rash/lesions?: No Itching?: No  Eyes Blurred vision?: No Double vision?: No  Ears/Nose/Throat Sore throat?: No Sinus problems?: No  Hematologic/Lymphatic Swollen glands?: No Easy bruising?: No  Cardiovascular Leg swelling?: No Chest pain?: No  Respiratory Cough?: No Shortness of breath?: No  Endocrine Excessive thirst?: No  Musculoskeletal Back pain?: No Joint pain?: No  Neurological Headaches?: No Dizziness?: No  Psychologic Depression?: No Anxiety?: No  Physical Exam: BP (!) 178/91 (BP Location: Right Arm, Patient Position: Sitting, Cuff Size: Small)   Pulse 61   Ht 5\' 6"  (1.676 m)   Wt 119 lb 14.4 oz (54.4 kg)   BMI 19.35 kg/m   Constitutional: Well nourished. Alert and oriented, No acute distress. HEENT: [place] AT, moist mucus membranes. Trachea midline, no masses. Cardiovascular: No clubbing, cyanosis, or edema. Respiratory: Normal respiratory effort, no increased work of breathing. Skin: No rashes, bruises or  suspicious lesions. Neurologic: Grossly intact, no focal deficits, moving all 4 extremities. Psychiatric: Normal mood and affect.  Laboratory Data: Lab Results  Component Value Date   WBC 6.9 05/11/2016   HGB 11.7 (L) 05/11/2016   HCT 36.0 05/11/2016   MCV 96.1 05/11/2016   PLT 349 05/11/2016    Lab Results  Component Value Date   CREATININE 0.73 05/11/2016   I have reviewed the labs  Assessment & Plan:    1. Urinary retention  - Foley catheter exchanged today  - will need repeat cystoscopy in 11/2017 and RUS  2. Bladder spasms  - Continue oxybutynin 5 mg 3 times a day when necessary for bladder spasms     Return in about 1 month (around 06/07/2017) for Foley catheter exchange.  These notes generated with voice recognition software. I apologize for typographical errors.  Michiel CowboySHANNON Nerida Boivin, PA-C  [facility] [address], [address] [place], [place] [address] [phone]  [phone]  

## 2017-05-08 ENCOUNTER — Encounter: Payer: Self-pay | Admitting: Urology

## 2017-05-08 ENCOUNTER — Ambulatory Visit (INDEPENDENT_AMBULATORY_CARE_PROVIDER_SITE_OTHER): Payer: Medicare Other | Admitting: Urology

## 2017-05-08 VITALS — BP 178/91 | HR 61 | Ht 66.0 in | Wt 119.9 lb

## 2017-05-08 DIAGNOSIS — N3289 Other specified disorders of bladder: Secondary | ICD-10-CM

## 2017-05-08 DIAGNOSIS — R339 Retention of urine, unspecified: Secondary | ICD-10-CM

## 2017-05-08 NOTE — Progress Notes (Signed)
Cath Change/ Replacement  Patient is present today for a catheter change due to urinary retention.  10ml of water was removed from the balloon, a 16FR foley cath was removed with out difficulty.  Patient was cleaned and prepped in a sterile fashion with betadine and 2% lidocaine jelly was instilled into the urethra. A 16 FR foley cath was replaced into the bladder no complications were noted Urine return was noted 20ml and urine was light yellow in color. The balloon was filled with 10ml of sterile water. A leg bag was attached for drainage.  A night bag was also given to the patient and patient was given instruction on how to change from one bag to another. Patient was given proper instruction on catheter care.    Preformed by: Teressa Lowerarrie Garrison, CMA

## 2017-05-22 ENCOUNTER — Other Ambulatory Visit: Payer: Self-pay

## 2017-05-22 MED ORDER — OXYBUTYNIN CHLORIDE 5 MG PO TABS: 5.0000 mg | ORAL_TABLET | Freq: Three times a day (TID) | ORAL | 0 refills | Status: DC

## 2017-06-05 ENCOUNTER — Ambulatory Visit: Payer: Medicare Other

## 2017-06-06 ENCOUNTER — Ambulatory Visit (INDEPENDENT_AMBULATORY_CARE_PROVIDER_SITE_OTHER): Payer: Medicare Other

## 2017-06-06 DIAGNOSIS — N39490 Overflow incontinence: Secondary | ICD-10-CM | POA: Diagnosis not present

## 2017-06-06 NOTE — Progress Notes (Signed)
Cath Change/ Replacement  Patient is present today for a catheter change due to urinary retention.  8ml of water was removed from the balloon, a 16FR foley cath was removed with out difficulty.  Patient was cleaned and prepped in a sterile fashion with betadine and 2% lidocaine jelly was instilled into the urethra. A 16 FR foley cath was replaced into the bladder no complications were noted Urine return was noted 5ml and urine was yellow in color. The balloon was filled with 10ml of sterile water. A leg bag was attached for drainage.  A night bag was also given to the patient and patient was given instruction on how to change from one bag to another. Patient was given proper instruction on catheter care.    Preformed by: Luther Hearing, CMA and Eligha Bridegroom, CMA  Follow up: 1 month cath exchange

## 2017-07-10 ENCOUNTER — Ambulatory Visit (INDEPENDENT_AMBULATORY_CARE_PROVIDER_SITE_OTHER): Payer: Medicare Other

## 2017-07-10 DIAGNOSIS — R339 Retention of urine, unspecified: Secondary | ICD-10-CM | POA: Diagnosis not present

## 2017-07-10 NOTE — Progress Notes (Signed)
Cath Change/ Replacement  Patient is present today for a catheter change due to urinary retention.  8ml of water was removed from the balloon, a 16FR foley cath was removed with out difficulty.  Patient was cleaned and prepped in a sterile fashion with betadine and 2% lidocaine jelly was instilled into the urethra. A 16 FR foley cath was replaced into the bladder no complications were noted Urine return was noted 10ml and urine was yellow in color. The balloon was filled with 10ml of sterile water. A leg bag was attached for drainage.  A night bag was also given to the patient and patient was given instruction on how to change from one bag to another. Patient was given proper instruction on catheter care.    Preformed by: Eligha BridegroomSarah Thunder [place], CMA  Follow up: 1 month cath change

## 2017-08-06 DIAGNOSIS — Z8669 Personal history of other diseases of the nervous system and sense organs: Secondary | ICD-10-CM | POA: Insufficient documentation

## 2017-08-06 DIAGNOSIS — Z66 Do not resuscitate: Secondary | ICD-10-CM | POA: Insufficient documentation

## 2017-08-07 ENCOUNTER — Ambulatory Visit (INDEPENDENT_AMBULATORY_CARE_PROVIDER_SITE_OTHER): Payer: Medicare Other | Admitting: Family Medicine

## 2017-08-07 DIAGNOSIS — R339 Retention of urine, unspecified: Secondary | ICD-10-CM | POA: Diagnosis not present

## 2017-08-07 NOTE — Progress Notes (Signed)
Cath Change/ Replacement  Patient is present today for a catheter change due to urinary retention.  10ml of water was removed from the balloon, a 16FR foley cath was removed with out difficulty.  Patient was cleaned and prepped in a sterile fashion with betadine and 2% lidocaine jelly was instilled into the urethra. A 16 FR foley cath was replaced into the bladder no complications were noted Urine return was noted 60ml and urine was light yellow in color. The balloon was filled with 10ml of sterile water. A leg bag was attached for drainage. Patient was given proper instruction on catheter care.    Preformed by: Teressa Lowerarrie Esau Fridman, CMA  Follow up: 1 month

## 2017-09-11 ENCOUNTER — Ambulatory Visit (INDEPENDENT_AMBULATORY_CARE_PROVIDER_SITE_OTHER): Payer: Medicare Other

## 2017-09-11 DIAGNOSIS — R339 Retention of urine, unspecified: Secondary | ICD-10-CM | POA: Diagnosis not present

## 2017-09-11 NOTE — Progress Notes (Signed)
Cath Change/ Replacement  Patient is present today for a catheter change due to urinary retention.  6ml of water was removed from the balloon, a 16FR foley cath was removed with out difficulty.  Patient was cleaned and prepped in a sterile fashion with betadine and 2% lidocaine jelly was instilled into the urethra. A 16 FR foley cath was replaced into the bladder no complications were noted Urine return was noted 5ml and urine was yellow in color. The balloon was filled with 10ml of sterile water. A leg bag was attached for drainage.  A night bag was also given to the patient and patient was given instruction on how to change from one bag to another. Patient was given proper instruction on catheter care.    Preformed by: Yolanda Cantu, CMA  Follow up: 1 month

## 2017-10-16 ENCOUNTER — Ambulatory Visit (INDEPENDENT_AMBULATORY_CARE_PROVIDER_SITE_OTHER): Payer: Medicare Other

## 2017-10-16 VITALS — BP 164/68 | HR 57

## 2017-10-16 DIAGNOSIS — R339 Retention of urine, unspecified: Secondary | ICD-10-CM

## 2017-10-16 NOTE — Progress Notes (Signed)
Cath Change/ Replacement  Patient is present today for a catheter change due to urinary retention.  8ml of water was removed from the balloon, a 16FR foley cath was removed with out difficulty.  Patient was cleaned and prepped in a sterile fashion with betadine and 2% lidocaine jelly was instilled into the urethra. A 16 FR foley cath was replaced into the bladder no complications were noted Urine return was noted 5ml and urine was yellow in color. The balloon was filled with 10ml of sterile water. A leg bag was attached for drainage.  A night bag was also given to the patient and patient was given instruction on how to change from one bag to another. Patient was given proper instruction on catheter care.    Preformed by: Eligha BridegroomSarah Melvinia Ashby, CMA  Follow up:  56month cath change

## 2017-11-13 ENCOUNTER — Ambulatory Visit: Payer: Medicare Other | Admitting: Family Medicine

## 2017-12-11 ENCOUNTER — Ambulatory Visit (INDEPENDENT_AMBULATORY_CARE_PROVIDER_SITE_OTHER): Payer: Medicare Other | Admitting: Family Medicine

## 2017-12-11 DIAGNOSIS — R339 Retention of urine, unspecified: Secondary | ICD-10-CM

## 2017-12-11 NOTE — Progress Notes (Signed)
Cath Change/ Replacement  Patient is present today for a catheter change due to urinary retention.  10ml of water was removed from the balloon, a 16FR foley cath was removed with out difficulty.  Patient was cleaned and prepped in a sterile fashion with betadine and 2% lidocaine jelly was instilled into the urethra. A 16 FR foley cath was replaced into the bladder no complications were noted Urine return was noted 10ml and urine was light yellow in color. The balloon was filled with 10ml of sterile water. A leg bag was attached for drainage. Patient was given proper instruction on catheter care.    Preformed by: Teressa Lowerarrie Joyce Leckey, CMA  Follow up: 1 month

## 2018-01-15 ENCOUNTER — Ambulatory Visit (INDEPENDENT_AMBULATORY_CARE_PROVIDER_SITE_OTHER): Payer: Medicare Other | Admitting: Family Medicine

## 2018-01-15 DIAGNOSIS — R339 Retention of urine, unspecified: Secondary | ICD-10-CM | POA: Diagnosis not present

## 2018-01-15 NOTE — Progress Notes (Signed)
Cath Change/ Replacement  Patient is present today for a catheter change due to urinary retention.  10ml of water was removed from the balloon, a 16FR foley cath was removed with out difficulty.  Patient was cleaned and prepped in a sterile fashion with betadine and 2% lidocaine jelly was instilled into the urethra. A 16 FR foley cath was replaced into the bladder no complications were noted Urine return was noted 60ml and urine was light yellow in color. The balloon was filled with 10ml of sterile water. A leg bag was attached for drainage.  Preformed by: Yolanda Cantu, CMA  Follow up: 1 month

## 2018-02-13 ENCOUNTER — Ambulatory Visit (INDEPENDENT_AMBULATORY_CARE_PROVIDER_SITE_OTHER): Payer: Medicare Other | Admitting: Urology

## 2018-02-13 DIAGNOSIS — R339 Retention of urine, unspecified: Secondary | ICD-10-CM | POA: Diagnosis not present

## 2018-02-13 NOTE — Progress Notes (Signed)
Cath Change/ Replacement  Patient is present today for a catheter change due to urinary retention.  8ml of water was removed from the balloon, a 16FR foley cath was removed with out difficulty.  Patient was cleaned and prepped in a sterile fashion with betadine and 2% lidocaine jelly was instilled into the urethra. A 16 FR foley cath was replaced into the bladder no complications were noted Urine return was noted 5ml and urine was clear yellow in color. The balloon was filled with 10ml of sterile water. A leg bag was attached for drainage.  A night bag was also given to the patient and patient was given instruction on how to change from one bag to another. Patient was given proper instruction on catheter care.    Preformed by: Yolanda Cantu, CMA  Follow up: 1 month

## 2018-03-18 ENCOUNTER — Ambulatory Visit (INDEPENDENT_AMBULATORY_CARE_PROVIDER_SITE_OTHER): Payer: Medicare Other | Admitting: Urology

## 2018-03-18 ENCOUNTER — Encounter: Payer: Self-pay | Admitting: Urology

## 2018-03-18 VITALS — BP 154/68 | HR 63 | Ht 66.0 in | Wt 124.0 lb

## 2018-03-18 DIAGNOSIS — R339 Retention of urine, unspecified: Secondary | ICD-10-CM | POA: Diagnosis not present

## 2018-03-18 NOTE — Progress Notes (Signed)
Cath Change/ Replacement  Patient is present today for a catheter change due to urinary retention.  50ml of water was removed from the balloon, a 16FR foley cath was removed with out difficulty.  Patient was cleaned and prepped in a sterile fashion with betadine and 2% lidocaine jelly was instilled into the urethra. A 16 FR foley cath was replaced into the bladder no complications were noted Urine return was noted 55ml and urine was clear  in color. The balloon was filled with 82ml of sterile water. A leg bag was attached for drainage.  A night bag was also given to the patient and patient was given instruction on how to change from one bag to another. Patient was given proper instruction on catheter care.    Preformed by: Eligha Bridegroom, CMA  Follow up: 1 month cath exchange

## 2018-03-18 NOTE — Progress Notes (Signed)
   03/18/18  CC:  Chief Complaint  Patient presents with  . Cysto    HPI: 85 year old female with chronic urinary retention managed with an indwelling Foley catheter.  She presents today for cath change as well as cystoscopy.  Her last cystoscopy was in 11/2016.  She is tolerating the catheter well.   Today's Vitals   03/18/18 1050  BP: (!) 154/68  Pulse: 63  Weight: 124 lb (56.2 kg)  Height: 5\' 6"  (1.676 m)   Body mass index is 20.01 kg/m. NED. A&Ox3.   No respiratory distress   Abd soft, NT, ND Normal external genitalia with patent urethral meatus  Cystoscopy Procedure Note  Patient identification was confirmed, informed consent was obtained, and patient was prepped using Betadine solution.  Lidocaine jelly was administered per urethral meatus.    Procedure: - Flexible cystoscope introduced, without any difficulty.   - Thorough search of the bladder revealed:    normal urethral meatus    Posterior wall bullous edema secondary to catheter cystitis.  She also has chickenpox-like appearance of her bladder consistent with cystitis cystica.  No papillary tumors appreciated.  Small amount of debris.    no stones    no ulcers     no tumors    no urethral polyps    Moderate trabeculation  - Ureteral orifices were normal in position and appearance.  Post-Procedure: - Patient tolerated the procedure well  Assessment/ Plan:  1. Urinary retention Chronic urinary retention managed by indwelling Foley catheter Cystoscopy today unremarkable other than sequela from chronic indwelling Foley and irritation No evidence of malignancy Would recommend continuation with annual cystoscopy for surveillance Given her age and comorbidities, will forego renal ultrasound, Cr 1.6    Return for monthly cath changes with MA, cysto in 1 year.  Vanna Scotland, MD

## 2018-04-04 ENCOUNTER — Emergency Department
Admission: EM | Admit: 2018-04-04 | Discharge: 2018-04-04 | Disposition: A | Discharge status: Home / Self Care | Payer: Medicare Other | Attending: Emergency Medicine | Admitting: Emergency Medicine

## 2018-04-04 ENCOUNTER — Other Ambulatory Visit: Payer: Self-pay

## 2018-04-04 ENCOUNTER — Encounter: Payer: Self-pay | Admitting: Emergency Medicine

## 2018-04-04 DIAGNOSIS — Y69 Unspecified misadventure during surgical and medical care: Secondary | ICD-10-CM | POA: Diagnosis not present

## 2018-04-04 DIAGNOSIS — Z7901 Long term (current) use of anticoagulants: Secondary | ICD-10-CM | POA: Insufficient documentation

## 2018-04-04 DIAGNOSIS — Z7984 Long term (current) use of oral hypoglycemic drugs: Secondary | ICD-10-CM | POA: Insufficient documentation

## 2018-04-04 DIAGNOSIS — E039 Hypothyroidism, unspecified: Secondary | ICD-10-CM | POA: Diagnosis not present

## 2018-04-04 DIAGNOSIS — I10 Essential (primary) hypertension: Secondary | ICD-10-CM | POA: Diagnosis not present

## 2018-04-04 DIAGNOSIS — T83511A Infection and inflammatory reaction due to indwelling urethral catheter, initial encounter: Secondary | ICD-10-CM | POA: Insufficient documentation

## 2018-04-04 DIAGNOSIS — T839XXA Unspecified complication of genitourinary prosthetic device, implant and graft, initial encounter: Secondary | ICD-10-CM | POA: Insufficient documentation

## 2018-04-04 DIAGNOSIS — Z96652 Presence of left artificial knee joint: Secondary | ICD-10-CM | POA: Insufficient documentation

## 2018-04-04 DIAGNOSIS — E119 Type 2 diabetes mellitus without complications: Secondary | ICD-10-CM | POA: Insufficient documentation

## 2018-04-04 DIAGNOSIS — N390 Urinary tract infection, site not specified: Secondary | ICD-10-CM | POA: Insufficient documentation

## 2018-04-04 DIAGNOSIS — Z79899 Other long term (current) drug therapy: Secondary | ICD-10-CM | POA: Insufficient documentation

## 2018-04-04 LAB — URINALYSIS, COMPLETE (UACMP) WITH MICROSCOPIC
Bilirubin Urine: NEGATIVE
Glucose, UA: NEGATIVE mg/dL
Ketones, ur: NEGATIVE mg/dL
NITRITE: POSITIVE — AB
PROTEIN: 30 mg/dL — AB
SPECIFIC GRAVITY, URINE: 1.014 (ref 1.005–1.030)
pH: 7 (ref 5.0–8.0)

## 2018-04-04 MED ORDER — NITROFURANTOIN MONOHYD MACRO 100 MG PO CAPS
100.0000 mg | ORAL_CAPSULE | Freq: Once | ORAL | Status: AC
  Administered 2018-04-04: 100 mg via ORAL
  Filled 2018-04-04: qty 1

## 2018-04-04 MED ORDER — NITROFURANTOIN MONOHYD MACRO 100 MG PO CAPS: 100.0000 mg | ORAL_CAPSULE | Freq: Two times a day (BID) | ORAL | 0 refills | Status: AC

## 2018-04-04 NOTE — ED Provider Notes (Signed)
[facility] Emergency Department Provider Note   ____________________________________________    I have reviewed the triage vital signs and the nursing notes.   HISTORY  Chief Complaint foley problem and possible UTI     HPI Yolanda Cantu is a 85 y.o. female with history of indwelling Foley catheter.  Patient reports she woke up this morning with some suprapubic pressure and discomfort and when she stood up urine came out around her catheter.  Denies fevers or chills.  No nausea or vomiting.  Has not taken anything for this.  No back pain  Past Medical History:  Diagnosis Date  . Arthritis    knees  . Atrial fibrillation (HCC)   . Diabetes mellitus without complication (HCC)   . Hypercholesteremia   . Hypertension   . Hypothyroidism   . Thyroid disease   . Wears dentures    partial lower    Patient Active Problem List   Diagnosis Date Noted  . Urinary retention 12/18/2016  . Closed right hip fracture (HCC) 09/23/2015  . Diabetes (HCC) 09/23/2015  . HTN (hypertension) 09/23/2015  . Hypothyroidism 09/23/2015  . Atrial fibrillation (HCC) 09/23/2015  . Pure hypercholesterolemia 10/18/2014  . B12 deficiency 08/24/2013  . Cobalamin deficiency 08/24/2013    Past Surgical History:  Procedure Laterality Date  . ABDOMINAL HYSTERECTOMY    . APPENDECTOMY    . CATARACT EXTRACTION W/PHACO Left 05/09/2015   Procedure: CATARACT EXTRACTION PHACO AND INTRAOCULAR LENS PLACEMENT (IOC) left eye;  Surgeon: Sherald Hess, MD;  Location[facility];  Service: Ophthalmology;  Laterality: Left;  DIABETIC - oral meds per pt 8 or after for arrival time  . CHOLECYSTECTOMY    . INTRAMEDULLARY (IM) NAIL INTERTROCHANTERIC Right 09/25/2015   Procedure: INTRAMEDULLARY (IM) NAIL INTERTROCHANTRIC;  Surgeon: Juanell Fairly, MD;  Location: [facility] ORS;  Service: Orthopedics;  Laterality: Right;  . SPLENECTOMY    . SPLENECTOMY    . TONSILLECTOMY    .  TOTAL KNEE ARTHROPLASTY Left     Prior to Admission medications   Medication Sig Start Date End Date Taking? Authorizing Provider  acetaminophen (TYLENOL) 500 MG tablet Take 500 mg by mouth every 6 (six) hours as needed.    [provider]  atorvastatin (LIPITOR) 40 MG tablet Take 20 mg by mouth daily.    [provider]  diltiazem (CARDIZEM CD) 240 MG 24 hr capsule TAKE 1 CAPSULE BY MOUTH ONCE A DAY 01/16/16   [provider]  levothyroxine (SYNTHROID, LEVOTHROID) 125 MCG tablet Take 125 mcg by mouth daily before breakfast.    [provider]  metFORMIN (GLUCOPHAGE) 500 MG tablet Take 250 mg by mouth 2 (two) times daily with a meal.    [provider]  nitrofurantoin, macrocrystal-monohydrate, (MACROBID) 100 MG capsule Take 1 capsule (100 mg total) by mouth 2 (two) times daily for 10 days. 04/04/18 04/14/18  Jene Every, MD  warfarin (COUMADIN) 2 MG tablet Take 2 mg by mouth daily.    [provider]     Allergies Patient has no known allergies.  Family History  Problem Relation Age of Onset  . Diabetes Father   . Stroke Father   . Lung cancer Brother   . Heart disease Mother   . Lung cancer Sister   . Prostate cancer Neg Hx   . Kidney cancer Neg Hx   . Bladder Cancer Neg Hx     Social History Social History   Tobacco Use  .  Smoking status: Never Smoker  . Smokeless tobacco: Never Used  Substance Use Topics  . Alcohol use: No  . Drug use: No    Review of Systems  Constitutional: No fever/chills Eyes: No visual changes.  ENT: No sore throat. Cardiovascular: Denies chest pain. Respiratory: Denies shortness of breath. Gastrointestinal: As above Genitourinary: As above Musculoskeletal: As above Skin: Negative for rash. Neurological: Negative for headaches    ____________________________________________   PHYSICAL EXAM:  VITAL SIGNS: ED Triage Vitals  Enc Vitals Group     BP 04/04/18 0730 (!) 174/75      Pulse Rate 04/04/18 0730 63     Resp 04/04/18 0730 16     Temp 04/04/18 0730 (!) 97.5 F (36.4 C)     Temp Source 04/04/18 0730 Oral     SpO2 04/04/18 0730 100 %     Weight 04/04/18 0727 54.4 kg (120 lb)     Height 04/04/18 0727 1.676 m (5\' 6" )     Head Circumference --      Peak Flow --      Pain Score 04/04/18 0727 0     Pain Loc --      Pain Edu? --      Excl. in GC? --     Constitutional: Alert and oriented.  Eyes: Conjunctivae are normal.   Nose: No congestion/rhinnorhea. Mouth/Throat: Mucous membranes are moist.    Cardiovascular: Normal rate, regular rhythm. Grossly normal heart sounds.  Good peripheral circulation. Respiratory: Normal respiratory effort.  No retractions. Lungs CTAB. Gastrointestinal: Soft and nontender. No distention.  Genitourinary: Catheter appropriately positioned, some amount of urine in the bag, yellow Musculoskeletal:   Warm and well perfused Neurologic:  Normal speech and language. No gross focal neurologic deficits are appreciated.  Skin:  Skin is warm, dry and intact. No rash noted. Psychiatric: Mood and affect are normal. Speech and behavior are normal.  ____________________________________________   LABS (all labs ordered are listed, but only abnormal results are displayed)  Labs Reviewed  URINALYSIS, COMPLETE (UACMP) WITH MICROSCOPIC - Abnormal; Notable for the following components:      Result Value   Color, Urine YELLOW (*)    APPearance HAZY (*)    Hgb urine dipstick MODERATE (*)    Protein, ur 30 (*)    Nitrite POSITIVE (*)    Leukocytes,Ua SMALL (*)    Bacteria, UA MANY (*)    All other components within normal limits  URINE CULTURE   ____________________________________________  EKG  None ____________________________________________  RADIOLOGY  None ____________________________________________   PROCEDURES  Procedure(s) performed: No  Procedures   Critical Care performed:  No ____________________________________________   INITIAL IMPRESSION / ASSESSMENT AND PLAN / ED COURSE  Pertinent labs & imaging results that were available during my care of the patient were reviewed by me and considered in my medical decision making (see chart for details).  Patient presents with likely Foley obstruction, questionable UTI.  We will replace the Foley sample urine, and start on antibiotics    ____________________________________________   FINAL CLINICAL IMPRESSION(S) / ED DIAGNOSES  Final diagnoses:  Complication of Foley catheter, initial encounter (HCC)  Urinary tract infection associated with indwelling urethral catheter, initial encounter [facility])        Note:  This document was prepared using Dragon voice recognition software and may include unintentional dictation errors.   Jene Every, MD 04/04/18 1018

## 2018-04-04 NOTE — ED Triage Notes (Addendum)
Pt c/o burning in bladder since yesterday.  Also reports urinating around foley.  Still has some urine in foley.  No fevers. Foley changed 2/18

## 2018-04-04 NOTE — ED Notes (Signed)
Patient AOx4  NAD

## 2018-04-05 LAB — URINE CULTURE

## 2018-04-08 ENCOUNTER — Encounter: Payer: Self-pay | Admitting: Urology

## 2018-04-08 ENCOUNTER — Other Ambulatory Visit: Payer: Self-pay

## 2018-04-08 ENCOUNTER — Ambulatory Visit (INDEPENDENT_AMBULATORY_CARE_PROVIDER_SITE_OTHER): Payer: Medicare Other | Admitting: Urology

## 2018-04-08 ENCOUNTER — Telehealth: Payer: Self-pay | Admitting: Urology

## 2018-04-08 VITALS — BP 146/78 | HR 64 | Resp 15 | Ht 66.0 in

## 2018-04-08 DIAGNOSIS — N3289 Other specified disorders of bladder: Secondary | ICD-10-CM

## 2018-04-08 DIAGNOSIS — R339 Retention of urine, unspecified: Secondary | ICD-10-CM | POA: Diagnosis not present

## 2018-04-08 LAB — BLADDER SCAN AMB NON-IMAGING: Scan Result: 16

## 2018-04-08 MED ORDER — OXYBUTYNIN CHLORIDE 5 MG PO TABS: 5.0000 mg | ORAL_TABLET | Freq: Three times a day (TID) | ORAL | 3 refills | Status: DC | PRN

## 2018-04-08 NOTE — Telephone Encounter (Signed)
Pt is scared she will wake up in the middle of the night and be wet everywhere.  Her cath is leaking.  She wants to prevent going back to the ER if possible.

## 2018-04-08 NOTE — Progress Notes (Signed)
04/08/2018 3:37 PM   Yolanda Cantu 03-14-1925 [id number]  Referring provider: Marisue Ivan, MD [phone] [facility] [address] [facility] [place], [place] [address]  Chief Complaint  Patient presents with  . Urinary Retention    HPI: Yolanda Cantu is a 85 y.o. female Caucasian with bladder spasms and urinary retention managed with an indwelling Foley who presents today complaining of a leaking catheter.  Background history Patient with chronic urinary retention since a right hip fracture.  Patient has failed several TOV's and has chosen to manage her retention with an indwelling Foley.  She was seen in the [facility] ED on 05/12/2016 for suprapubic pressure and draining more urine than usual.  She was diagnosed with an UTI, given Keflex and discharged to home.  Urine culture from the emergency room was positive for multiple species present.  Today, she still complains of the suprapubic pressure and draining more urine than usual.  She is experiencing leakage around the catheter.  Urine in the catheter is clear and yellow.  She states that she is drinking sixteen 8 ounces bottles of water daily.   She is not having gross hematuria. She denies fever, chills, nausea and vomiting.  She underwent cystoscopy evaluation with Dr. Apolinar Junes on 12/18/2016 which was negative.  On 05/08/2017, she was not having any urinary complaints at that time.  She denied any dysuria, gross hematuria or suprapubic pain.  She denied any fever, chills, nausea and vomiting.  Today (04/08/2018), her PVR is 16 mL.  Patient had gone to the emergency department on 04/04/2018 with a possibly obstructed Foley and UTI; the Foley was exchanged and patient given antibiotics.  Patient denies pain now when her Foley catheter leaks, and reports that it is draining into the bag.  Patient confirms she is not on anything for bladder spasms.  Patient denies visible sediment on 04/04/2018, but admits occasion sediment.  PMH: Past  Medical History:  Diagnosis Date  . Arthritis    knees  . Atrial fibrillation (HCC)   . Diabetes mellitus without complication (HCC)   . Hypercholesteremia   . Hypertension   . Hypothyroidism   . Thyroid disease   . Wears dentures    partial lower    Surgical History: Past Surgical History:  Procedure Laterality Date  . ABDOMINAL HYSTERECTOMY    . APPENDECTOMY    . CATARACT EXTRACTION W/PHACO Left 05/09/2015   Procedure: CATARACT EXTRACTION PHACO AND INTRAOCULAR LENS PLACEMENT (IOC) left eye;  Surgeon: Sherald Hess, MD;  Location: [facility];  Service: Ophthalmology;  Laterality: Left;  DIABETIC - oral meds per pt 8 or after for arrival time  . CHOLECYSTECTOMY    . INTRAMEDULLARY (IM) NAIL INTERTROCHANTERIC Right 09/25/2015   Procedure: INTRAMEDULLARY (IM) NAIL INTERTROCHANTRIC;  Surgeon: Juanell Fairly, MD;  Location: [facility] ORS;  Service: Orthopedics;  Laterality: Right;  . SPLENECTOMY    . SPLENECTOMY    . TONSILLECTOMY    . TOTAL KNEE ARTHROPLASTY Left     Home Medications:  Allergies as of 04/08/2018   No Known Allergies     Medication List       Accurate as of April 08, 2018  3:37 PM. Always use your most recent med list.        acetaminophen 500 MG tablet Commonly known as:  TYLENOL Take 500 mg by mouth every 6 (six) hours as needed.   atorvastatin 40 MG tablet Commonly known as:  LIPITOR Take 20 mg by mouth daily.  Combigan 0.2-0.5 % ophthalmic solution Generic drug:  brimonidine-timolol INSTILL 1 DROP INTO BOTH EYES TWICE A DAY   diltiazem 240 MG 24 hr capsule Commonly known as:  CARDIZEM CD TAKE 1 CAPSULE BY MOUTH ONCE A DAY   latanoprost 0.005 % ophthalmic solution Commonly known as:  XALATAN INSTILL ONE DROP IN BOTH EYES AT BEDTIME.   levothyroxine 125 MCG tablet Commonly known as:  SYNTHROID, LEVOTHROID Take 125 mcg by mouth daily before breakfast.   metFORMIN 500 MG tablet Commonly known as:  GLUCOPHAGE Take 250 mg by  mouth 2 (two) times daily with a meal.   nitrofurantoin (macrocrystal-monohydrate) 100 MG capsule Commonly known as:  Macrobid Take 1 capsule (100 mg total) by mouth 2 (two) times daily for 10 days.   oxybutynin 5 MG tablet Commonly known as:  DITROPAN Take 1 tablet (5 mg total) by mouth every 8 (eight) hours as needed for bladder spasms.   warfarin 2 MG tablet Commonly known as:  COUMADIN Take 2 mg by mouth daily.       Allergies: No Known Allergies  Family History: Family History  Problem Relation Age of Onset  . Diabetes Father   . Stroke Father   . Lung cancer Brother   . Heart disease Mother   . Lung cancer Sister   . Prostate cancer Neg Hx   . Kidney cancer Neg Hx   . Bladder Cancer Neg Hx     Social History:  reports that she has never smoked. She has never used smokeless tobacco. She reports that she does not drink alcohol or use drugs.  ROS: UROLOGY Frequent Urination?: No Hard to postpone urination?: No Burning/pain with urination?: No Get up at night to urinate?: No Leakage of urine?: No Urine stream starts and stops?: No Trouble starting stream?: No Do you have to strain to urinate?: No Blood in urine?: No Urinary tract infection?: No Sexually transmitted disease?: No Injury to kidneys or bladder?: No Painful intercourse?: No Weak stream?: No Currently pregnant?: No Vaginal bleeding?: No Last menstrual period?: n  Gastrointestinal Nausea?: No Vomiting?: No Indigestion/heartburn?: No Constipation?: No  Constitutional Fever: No Night sweats?: No Weight loss?: No Fatigue?: No  Skin Skin rash/lesions?: No Itching?: No  Eyes Blurred vision?: No Double vision?: No  Ears/Nose/Throat Sore throat?: No Sinus problems?: No  Hematologic/Lymphatic Swollen glands?: No Easy bruising?: No  Cardiovascular Leg swelling?: No Chest pain?: No  Respiratory Cough?: No Shortness of breath?: No  Endocrine Excessive thirst?:  No  Musculoskeletal Back pain?: No Joint pain?: No  Neurological Headaches?: No Dizziness?: No  Psychologic Depression?: No Anxiety?: No  Physical Exam: BP (!) 146/78   Pulse 64   Resp 15   Ht 5\' 6"  (1.676 m)   BMI 19.37 kg/m   Constitutional: Well nourished. Alert and oriented, No acute distress. Cardiovascular: No clubbing, cyanosis, or edema. Respiratory: Normal respiratory effort, no increased work of breathing. Skin: No rashes, bruises or suspicious lesions. Neurologic: Grossly intact, no focal deficits, moving all 4 extremities. Psychiatric: Normal mood and affect.   Laboratory Data:  Urinalysis    Component Value Date/Time   COLORURINE YELLOW (A) 04/04/2018 0754   APPEARANCEUR HAZY (A) 04/04/2018 0754   LABSPEC 1.014 04/04/2018 0754   PHURINE 7.0 04/04/2018 0754   GLUCOSEU NEGATIVE 04/04/2018 0754   HGBUR MODERATE (A) 04/04/2018 0754   BILIRUBINUR NEGATIVE 04/04/2018 0754   KETONESUR NEGATIVE 04/04/2018 0754   PROTEINUR 30 (A) 04/04/2018 0754   NITRITE POSITIVE (A) 04/04/2018 0754  LEUKOCYTESUR SMALL (A) 04/04/2018 0754   I have reviewed the labs.  Assessment & Plan:    1. Urinary retention - Foley catheter exchanged last 04/04/2018 in emergency department  2. Bladder spasms - Continue oxybutynin 5 mg 3 times a day when necessary for bladder spasms; refills sent - Patient requested smaller perscriptions so she will use up a prescription in a reasonable time -  - Directed patient to take oxybutynin if the leakage around the Foley is not accompanied by other symptoms   Return in about 1 month (around 05/09/2018) for Foley exchange .  Michiel CowboySHANNON Hamp Moreland, PA-C  [facility] [address], [address] [place], [place] [address] [phone]  I, Duanne MoronKatharine Samson, am acting as a Neurosurgeonscribe for [employer]uc[employer] CorporationShannon Elliett Guarisco, PA-C.   I have reviewed the above documentation for accuracy and completeness, and I agree with the above.    Michiel CowboyShannon  Deigo Alonso, PA-C

## 2018-04-17 ENCOUNTER — Ambulatory Visit: Payer: Medicare Other

## 2018-05-06 ENCOUNTER — Other Ambulatory Visit: Payer: Self-pay

## 2018-05-06 ENCOUNTER — Ambulatory Visit (INDEPENDENT_AMBULATORY_CARE_PROVIDER_SITE_OTHER): Payer: Medicare Other

## 2018-05-06 VITALS — BP 125/77 | HR 56 | Ht 66.0 in | Wt 120.0 lb

## 2018-05-06 DIAGNOSIS — R339 Retention of urine, unspecified: Secondary | ICD-10-CM | POA: Diagnosis not present

## 2018-05-06 NOTE — Progress Notes (Signed)
Cath Change/ Replacement  Patient is present today for a catheter change due to urinary retention.  48ml of water was removed from the balloon, a 16FR foley cath was removed with out difficulty.  Patient was cleaned and prepped in a sterile fashion with betadine and 2% lidocaine jelly was instilled into the urethra. A 16 FR foley cath was replaced into the bladder no complications were noted Urine return was noted 70ml and urine was yellow in color. The balloon was filled with 14ml of sterile water. A leg bag was attached for drainage.  A night bag was also given to the patient and patient was given instruction on how to change from one bag to another. Patient was given proper instruction on catheter care.    Preformed by: Delena Bali, CMA   Follow up: 1 month for cath exchange

## 2018-06-04 ENCOUNTER — Ambulatory Visit (INDEPENDENT_AMBULATORY_CARE_PROVIDER_SITE_OTHER): Payer: Medicare Other

## 2018-06-04 ENCOUNTER — Other Ambulatory Visit: Payer: Self-pay

## 2018-06-04 DIAGNOSIS — R339 Retention of urine, unspecified: Secondary | ICD-10-CM

## 2018-06-04 NOTE — Progress Notes (Addendum)
Cath Change/ Replacement  Patient is present today for a catheter change due to urinary retention.  6ml of water was removed from the balloon, a 16FR foley cath was removed with out difficulty.  Patient was cleaned and prepped in a sterile fashion with betadine and 2% lidocaine jelly was instilled into the urethra. A 16 FR foley cath was replaced into the bladder no complications were noted Urine return was noted 10ml and urine was yellow in color. The balloon was filled with 10ml of sterile water. A leg bag was attached for drainage.  A night bag was also given to the patient and patient was given instruction on how to change from one bag to another. Patient was given proper instruction on catheter care.    Preformed by: Jaquesha Boroff, CMA  Follow up: 1 month  

## 2018-07-02 ENCOUNTER — Ambulatory Visit (INDEPENDENT_AMBULATORY_CARE_PROVIDER_SITE_OTHER): Payer: Medicare Other

## 2018-07-02 ENCOUNTER — Other Ambulatory Visit: Payer: Self-pay

## 2018-07-02 DIAGNOSIS — R339 Retention of urine, unspecified: Secondary | ICD-10-CM | POA: Diagnosis not present

## 2018-07-02 NOTE — Progress Notes (Signed)
Cath Change/ Replacement  Patient is present today for a catheter change due to urinary retention.  51ml of water was removed from the balloon, a 16FR foley cath was removed with out difficulty.  Patient was cleaned and prepped in a sterile fashion with betadine and 2% lidocaine jelly was instilled into the urethra. A 16FR foley cath was replaced into the bladder no complications were noted Urine return was noted 62ml and urine was pale yellow in color. The balloon was filled with 47ml of sterile water. A leg bag was attached for drainage.  A night bag was also given to the patient and patient was given instruction on how to change from one bag to another. Patient was given proper instruction on catheter care. Also of note patient educated on "purple bag".   Preformed by: Debbe Bales, CMA (AAMA)  Follow up: As scheduled in 1 month

## 2018-07-09 IMAGING — CR DG ABDOMEN 1V
1 series · 2 of 2 positions shown · non-contrast
Comparison: None.

CLINICAL DATA: Lower abdominal pain and pressure over the last 2
weeks.

EXAM:
ABDOMEN - 1 VIEW

[Series 1: dg abd 1 view · 0.14mm/px · 2 of 2 slices shown]
[im 1/2]
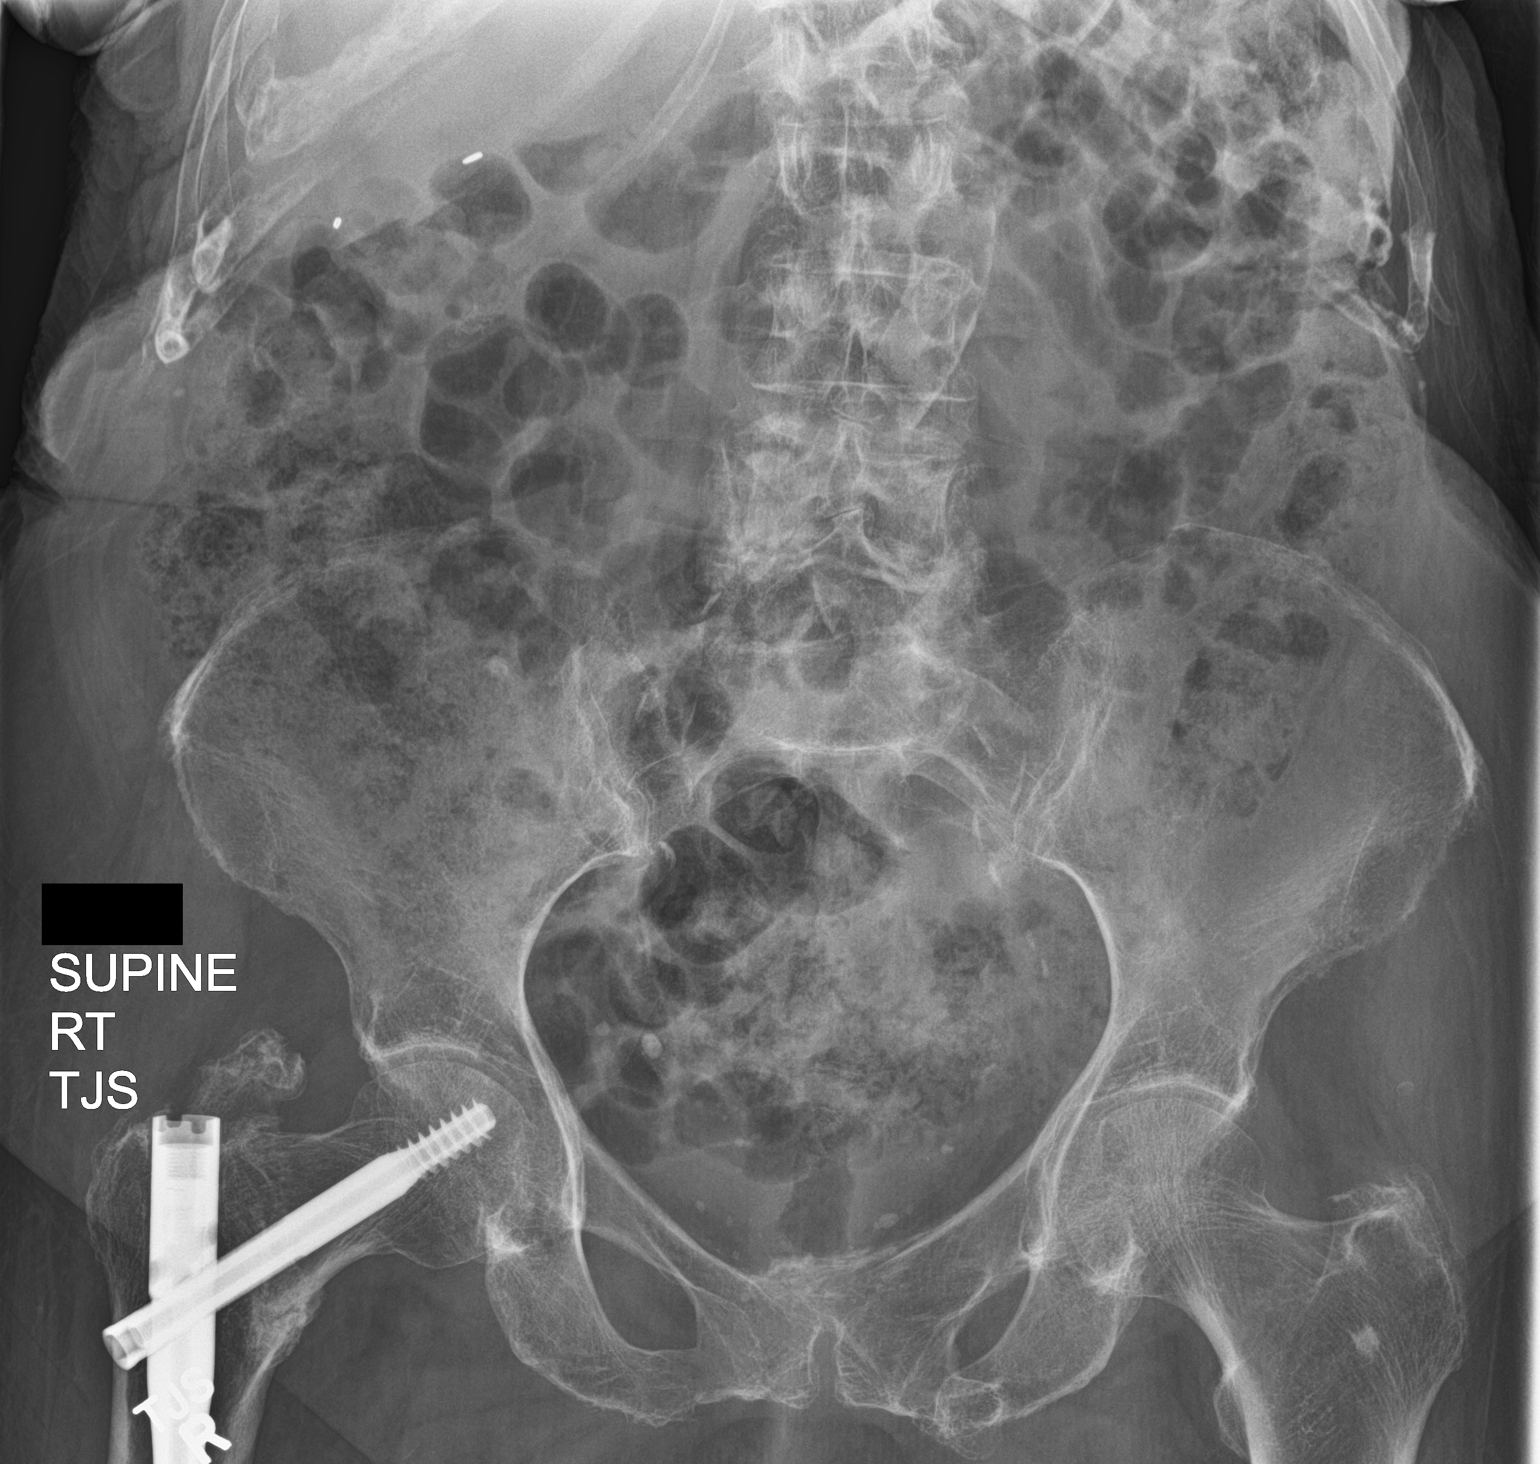
[im 2/2]
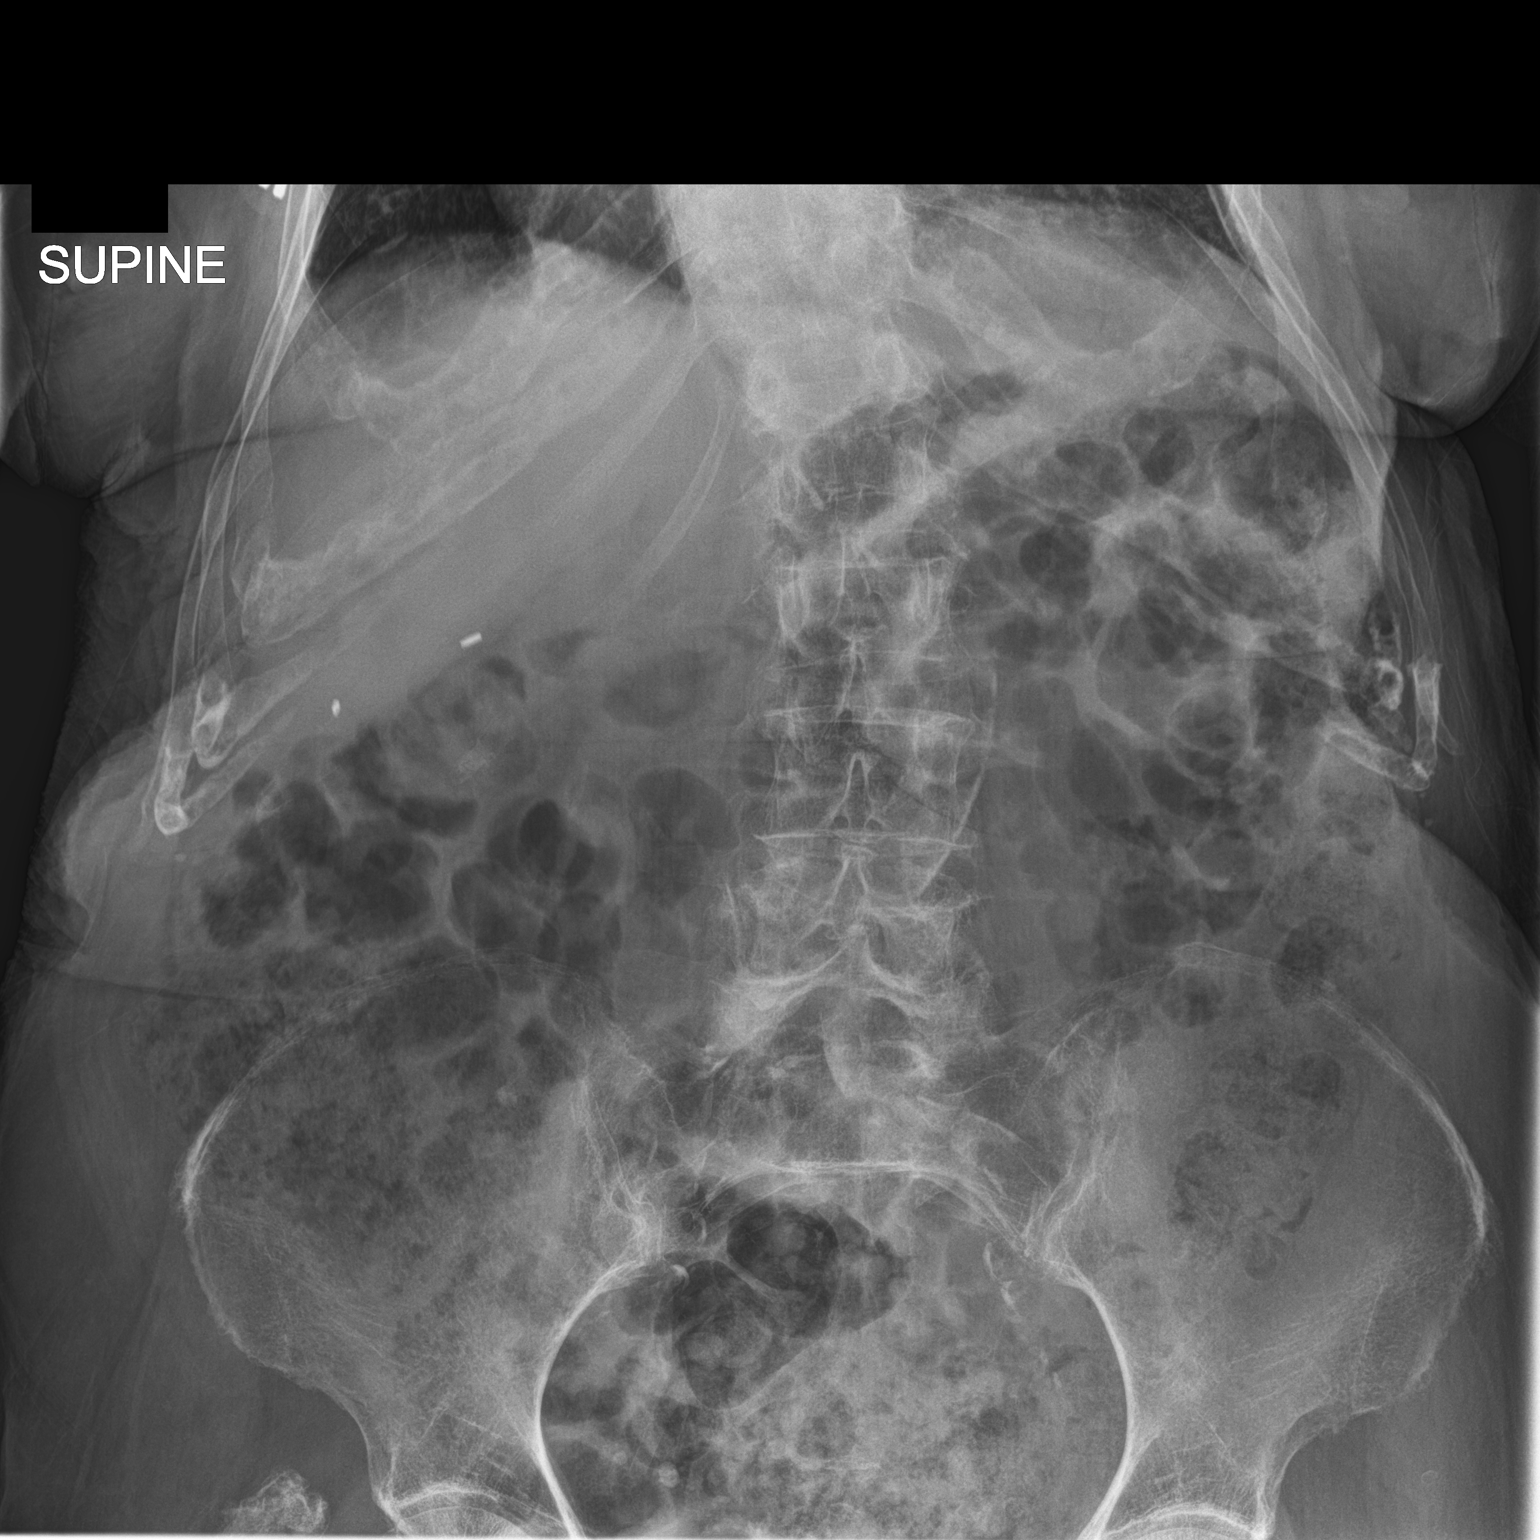

[2 of 2 positions shown; findings below may reference images not displayed]

FINDINGS: No evidence of ileus or obstruction. Moderate amount of fecal matter
in the colon. Clips in the right upper quadrant consistent with
previous cholecystectomy. Aortic calcification. Previous ORIF of
femur fracture. Chronic ankylosis of the sacroiliac joint. Old rami
fractures on the left. Curvature the spine convex to the left.
IMPRESSION: No acute finding.  Moderate amount of fecal matter in the colon.

## 2018-08-04 ENCOUNTER — Other Ambulatory Visit: Payer: Self-pay

## 2018-08-04 ENCOUNTER — Ambulatory Visit (INDEPENDENT_AMBULATORY_CARE_PROVIDER_SITE_OTHER): Payer: Medicare Other | Admitting: Family Medicine

## 2018-08-04 DIAGNOSIS — R339 Retention of urine, unspecified: Secondary | ICD-10-CM | POA: Diagnosis not present

## 2018-08-04 NOTE — Progress Notes (Signed)
Cath Change/ Replacement  Patient is present today for a catheter change due to urinary retention.  12ml of water was removed from the balloon, a 16FR foley cath was removed with out difficulty.  Patient was cleaned and prepped in a sterile fashion with betadine and 2% lidocaine jelly was instilled into the urethra. A 16 FR foley cath was replaced into the bladder no complications were noted Urine return was noted 60ml and urine was light yellow in color. The balloon was filled with 21ml of sterile water. A leg bag was attached for drainage.  A night bag was also given to the patient and patient was given instruction on how to change from one bag to another. Patient was given proper instruction on catheter care.    Preformed by: Elberta Leatherwood, CMA  Follow up: 1 month for cath change and see [facility]

## 2018-09-03 NOTE — Progress Notes (Signed)
04/08/2018 10:54 AM   Yolanda Cantu [birth year] [id number]  Referring provider: Dion Body, MD [place] [facility] [place],  [place] [address]  Chief Complaint  Patient presents with  . Urinary Retention    HPI: Yolanda Cantu is a 85 y.o. female with bladder spasms and urinary retention managed with an indwelling Foley who presents today for follow up.    Patient with chronic urinary retention since a right hip fracture.  Patient has failed several TOV's and has chosen to manage her retention with an indwelling Foley.    She underwent cystoscopy evaluation with Dr. Erlene Quan on 12/18/2016 which was negative.  She has no complaints regarding her Foley at this time.  Patient denies any gross hematuria or suprapubic/flank pain.  Patient denies any fevers, chills, nausea or vomiting.   She is having pain in her left tibialis anterior muscle for the last several weeks.  She states that it feels like it is on fire.  It is not worsening with walking.  She does not report calf tenderness.  It bothers her the most at night.    PMH: Past Medical History:  Diagnosis Date  . Arthritis    knees  . Atrial fibrillation ([place])   . Diabetes mellitus without complication ([place])   . Hypercholesteremia   . Hypertension   . Hypothyroidism   . Thyroid disease   . Wears dentures    partial lower    Surgical History: Past Surgical History:  Procedure Laterality Date  . ABDOMINAL HYSTERECTOMY    . APPENDECTOMY    . CATARACT EXTRACTION W/PHACO Left 05/09/2015   Procedure: CATARACT EXTRACTION PHACO AND INTRAOCULAR LENS PLACEMENT ([place]) left eye;  Surgeon: Ronnell Freshwater, MD;  Location: [place];  Service: Ophthalmology;  Laterality: Left;  DIABETIC - oral meds per pt 8 or after for arrival time  . CHOLECYSTECTOMY    . INTRAMEDULLARY (IM) NAIL INTERTROCHANTERIC Right 09/25/2015   Procedure: INTRAMEDULLARY (IM) NAIL INTERTROCHANTRIC;  Surgeon: Thornton Park, MD;  Location: [facility];  Service: Orthopedics;  Laterality: Right;  . SPLENECTOMY    . SPLENECTOMY    . TONSILLECTOMY    . TOTAL KNEE ARTHROPLASTY Left     Home Medications:  Allergies as of 09/04/2018   No Known Allergies     Medication List       Accurate as of September 04, 2018 10:54 AM. If you have any questions, ask your nurse or doctor.        acetaminophen 500 MG tablet Commonly known as: TYLENOL Take 500 mg by mouth every 6 (six) hours as needed.   atorvastatin 40 MG tablet Commonly known as: LIPITOR Take 20 mg by mouth daily.   Combigan 0.2-0.5 % ophthalmic solution Generic drug: brimonidine-timolol INSTILL 1 DROP INTO BOTH EYES TWICE A DAY   diltiazem 240 MG 24 hr capsule Commonly known as: CARDIZEM CD TAKE 1 CAPSULE BY MOUTH ONCE A DAY   latanoprost 0.005 % ophthalmic solution Commonly known as: XALATAN INSTILL ONE DROP IN BOTH EYES AT BEDTIME.   levothyroxine 125 MCG tablet Commonly known as: SYNTHROID Take 125 mcg by mouth daily before breakfast.   lisinopril 10 MG tablet Commonly known as: ZESTRIL Take by mouth.   metFORMIN 500 MG tablet Commonly known as: GLUCOPHAGE Take 250 mg by mouth 2 (two) times daily with a meal.   oxybutynin 5 MG tablet Commonly known as: DITROPAN Take 1 tablet (5 mg total) by mouth every 8 (eight)  hours as needed for bladder spasms.   warfarin 2 MG tablet Commonly known as: COUMADIN Take 2 mg by mouth daily.       Allergies: No Known Allergies  Family History: Family History  Problem Relation Age of Onset  . Diabetes Father   . Stroke Father   . Lung cancer Brother   . Heart disease Mother   . Lung cancer Sister   . Prostate cancer Neg Hx   . Kidney cancer Neg Hx   . Bladder Cancer Neg Hx     Social History:  reports that she has never smoked. She has never used smokeless tobacco. She reports that she does not drink alcohol or use drugs.  ROS: UROLOGY Frequent Urination?: No Hard to  postpone urination?: No Burning/pain with urination?: No Get up at night to urinate?: No Leakage of urine?: No Urine stream starts and stops?: No Trouble starting stream?: No Do you have to strain to urinate?: No Blood in urine?: No Urinary tract infection?: No Sexually transmitted disease?: No Injury to kidneys or bladder?: No Painful intercourse?: No Weak stream?: No Currently pregnant?: No Vaginal bleeding?: No Last menstrual period?: n  Gastrointestinal Nausea?: No Vomiting?: No Indigestion/heartburn?: No Diarrhea?: No Constipation?: No  Constitutional Fever: No Night sweats?: No Weight loss?: No Fatigue?: No  Skin Skin rash/lesions?: No Itching?: No  Eyes Blurred vision?: No Double vision?: No  Ears/Nose/Throat Sore throat?: No Sinus problems?: No  Hematologic/Lymphatic Swollen glands?: No Easy bruising?: No  Cardiovascular Leg swelling?: No Chest pain?: No  Respiratory Cough?: No Shortness of breath?: No  Endocrine Excessive thirst?: No  Musculoskeletal Back pain?: No Joint pain?: No  Neurological Headaches?: No Dizziness?: No  Psychologic Depression?: No Anxiety?: No  Physical Exam: BP (!) 163/84 (BP Location: Left Arm, Patient Position: Sitting, Cuff Size: Normal)   Pulse (!) 50   Ht 5\' 6"  (1.676 m)   Wt 132 lb (59.9 kg)   BMI 21.31 kg/m   Constitutional:  Well nourished. Alert and oriented, No acute distress. HEENT: [place] AT, moist mucus membranes.  Trachea midline, no masses. Cardiovascular: No clubbing, cyanosis, or edema. Respiratory: Normal respiratory effort, no increased work of breathing. Neurologic: Grossly intact, no focal deficits, moving all 4 extremities. Psychiatric: Normal mood and affect.   Laboratory Data: Urinalysis    Component Value Date/Time   COLORURINE YELLOW (A) 04/04/2018 0754   APPEARANCEUR HAZY (A) 04/04/2018 0754   LABSPEC 1.014 04/04/2018 0754   PHURINE 7.0 04/04/2018 0754   GLUCOSEU NEGATIVE  04/04/2018 0754   HGBUR MODERATE (A) 04/04/2018 0754   BILIRUBINUR NEGATIVE 04/04/2018 0754   KETONESUR NEGATIVE 04/04/2018 0754   PROTEINUR 30 (A) 04/04/2018 0754   NITRITE POSITIVE (A) 04/04/2018 0754   LEUKOCYTESUR SMALL (A) 04/04/2018 0754   I have reviewed the labs.  Assessment & Plan:    1. Urinary retention - Foley catheter exchanged today - RTC in one month for exchange  2. Bladder spasms - Continue oxybutynin 5 mg 3 times a day when necessary for bladder spasms  3. Leg pain - advised patient to notify PCP as she had an upcoming visit in two weeks - advised the patient to change the leg she attaches the leg bag as she continues to place it on the left leg    Return in about 1 month (around 10/05/2018) for Foley catheter exchange .  Michiel CowboySHANNON Quinnlan Abruzzo, PA-C  [facility] [address], [address] [place], [place] [address] [phone]

## 2018-09-04 ENCOUNTER — Encounter: Payer: Self-pay | Admitting: Urology

## 2018-09-04 ENCOUNTER — Ambulatory Visit (INDEPENDENT_AMBULATORY_CARE_PROVIDER_SITE_OTHER): Payer: Medicare Other | Admitting: Urology

## 2018-09-04 ENCOUNTER — Other Ambulatory Visit: Payer: Self-pay

## 2018-09-04 VITALS — BP 163/84 | HR 50 | Ht 66.0 in | Wt 132.0 lb

## 2018-09-04 DIAGNOSIS — N3289 Other specified disorders of bladder: Secondary | ICD-10-CM

## 2018-09-04 DIAGNOSIS — R339 Retention of urine, unspecified: Secondary | ICD-10-CM

## 2018-09-04 NOTE — Progress Notes (Signed)
Cath Change/ Replacement  Patient is present today for a catheter change due to urinary retention.  85ml of water was removed from the balloon, a 16FR foley cath was removed with out difficulty.  Patient was cleaned and prepped in a sterile fashion with betadine and 2% lidocaine jelly was instilled into the urethra. A 16 FR foley cath was replaced into the bladder no complications were noted Urine return was noted 62ml and urine was yellow in color. The balloon was filled with 57ml of sterile water. A leg bag was attached for drainage.  A night bag was also given to the patient and patient was given instruction on how to change from one bag to another. Patient was given proper instruction on catheter care.    Preformed by: Elberta Leatherwood  Follow up: 1 month cath change

## 2018-10-03 ENCOUNTER — Ambulatory Visit (INDEPENDENT_AMBULATORY_CARE_PROVIDER_SITE_OTHER): Payer: Medicare Other | Admitting: Family Medicine

## 2018-10-03 ENCOUNTER — Other Ambulatory Visit: Payer: Self-pay

## 2018-10-03 DIAGNOSIS — R339 Retention of urine, unspecified: Secondary | ICD-10-CM | POA: Diagnosis not present

## 2018-10-03 NOTE — Progress Notes (Signed)
Cath Change/ Replacement  Patient is present today for a catheter change due to urinary retention.  61ml of water was removed from the balloon, a 16FR foley cath was removed with out difficulty.  Patient was cleaned and prepped in a sterile fashion with betadine and 2% lidocaine jelly was instilled into the urethra. A 16 FR foley cath was replaced into the bladder no complications were noted Urine return was noted 9ml and urine was yellow in color. The balloon was filled with 29ml of sterile water. A leg bag was attached for drainage. Patient was given proper instruction on catheter care.    Performed by: Elberta Leatherwood, CMA  Follow up: 1 month cath change

## 2018-11-03 ENCOUNTER — Ambulatory Visit: Payer: Medicare Other

## 2018-11-03 ENCOUNTER — Other Ambulatory Visit: Payer: Self-pay

## 2018-11-03 ENCOUNTER — Ambulatory Visit (INDEPENDENT_AMBULATORY_CARE_PROVIDER_SITE_OTHER): Payer: Medicare Other | Admitting: Physician Assistant

## 2018-11-03 DIAGNOSIS — R339 Retention of urine, unspecified: Secondary | ICD-10-CM | POA: Diagnosis not present

## 2018-11-03 NOTE — Progress Notes (Signed)
Cath Change/ Replacement  Patient is present today for a catheter change due to urinary retention.  83ml of water was removed from the balloon, a 16FR foley cath was removed with out difficulty.  Patient was cleaned and prepped in a sterile fashion with betadine. A 16 FR foley cath was replaced into the bladder no complications were noted Urine return was noted 80ml and urine was clear yellow in color. The balloon was filled with 37ml of sterile water. A leg bag was attached for drainage.  A night bag and an additional leg bag were also given to the patient and patient was given instruction on how to change from one bag to another. Patient was given proper instruction on catheter care.    Performed by: Debroah Loop, PA-C  Follow up: Return in about 4 weeks (around 12/01/2018) for Catheter exchange.

## 2018-11-22 IMAGING — CR DG WRIST COMPLETE 3+V*L*
4 series · 4 of 4 positions shown · non-contrast
Comparison: None.

CLINICAL DATA: Fall, ulnar-sided wrist pain.

EXAM:
LEFT WRIST - COMPLETE 3+ VIEW

[wrist pa]
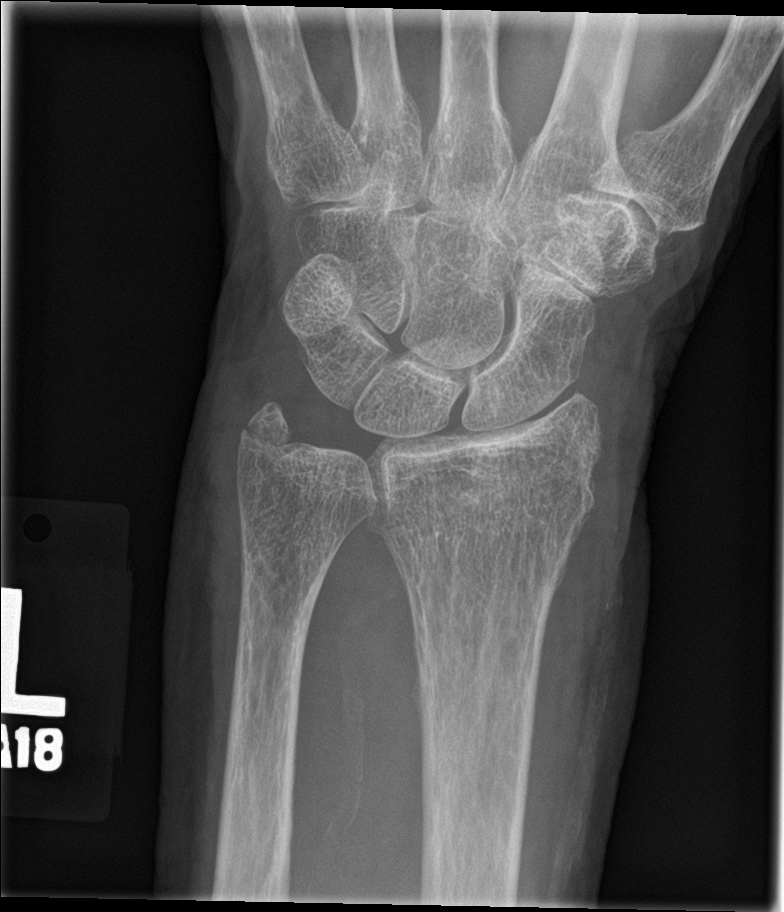

[wrist obl]
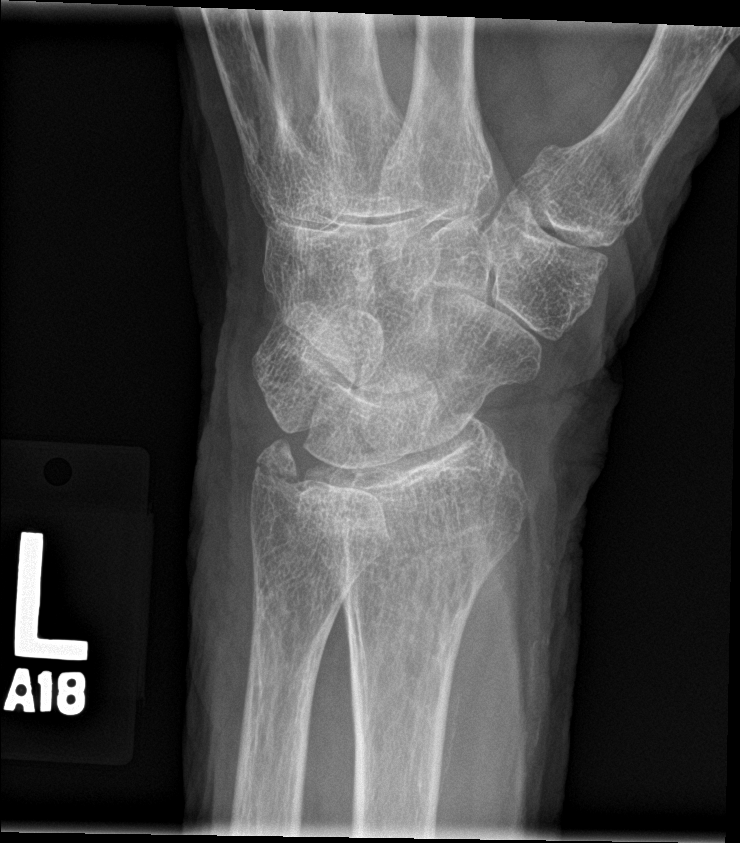

[wrist lat]
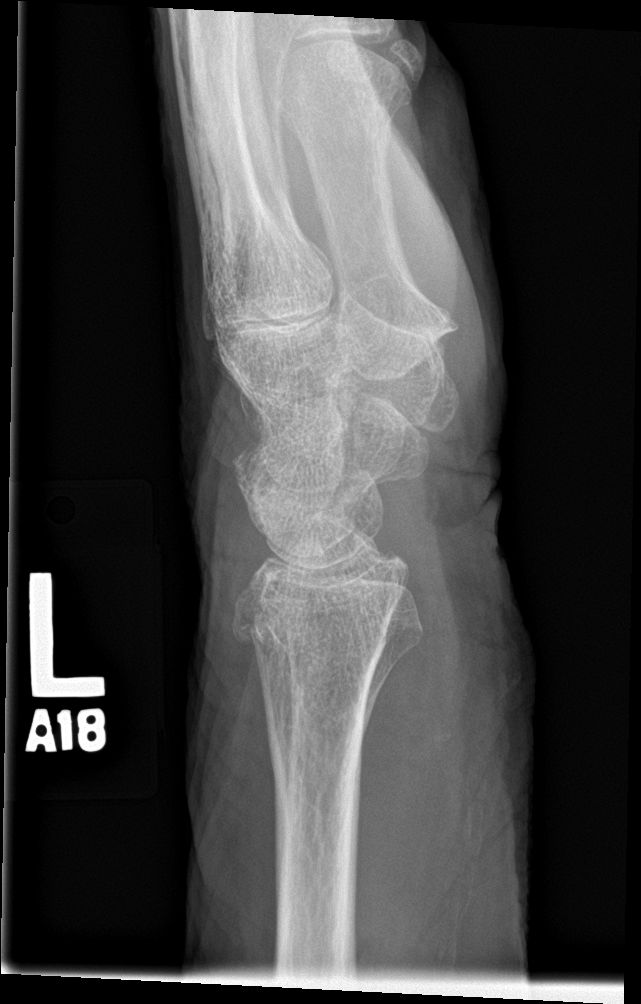

[navicular]
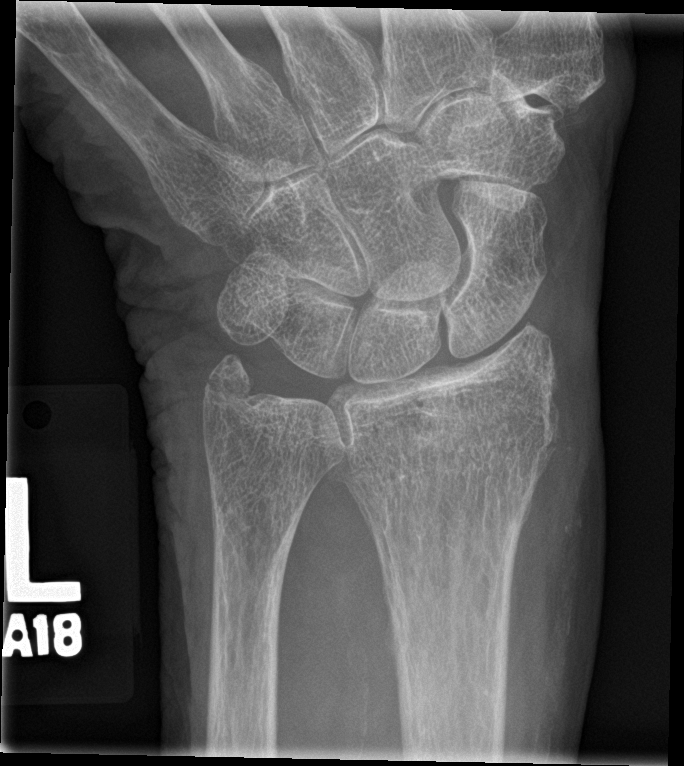

[4 of 4 positions shown; findings below may reference images not displayed]

FINDINGS: Nondisplaced fracture of the ulnar styloid. The radius appears
normal. Radiocarpal joint is intact. No carpal bone fracture.
IMPRESSION: Nondisplaced fracture of the ulnar styloid.

## 2018-11-22 IMAGING — CR DG ELBOW COMPLETE 3+V*L*
4 series · 4 of 4 positions shown · non-contrast
Comparison: None.

CLINICAL DATA: Ulnar sided pain.  Elbow pain.

EXAM:
LEFT ELBOW - COMPLETE 3+ VIEW

[elbow ap]
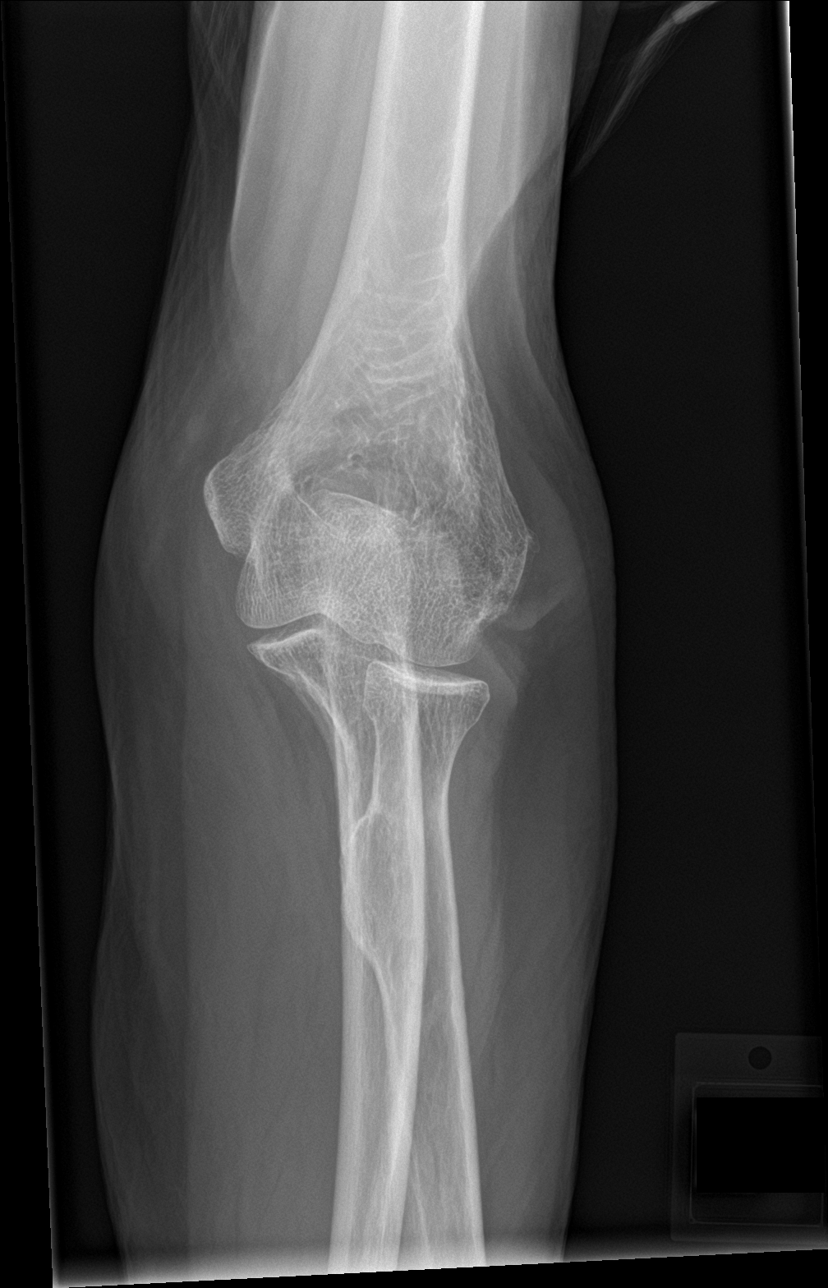

[elbow obl (1 of 2)]
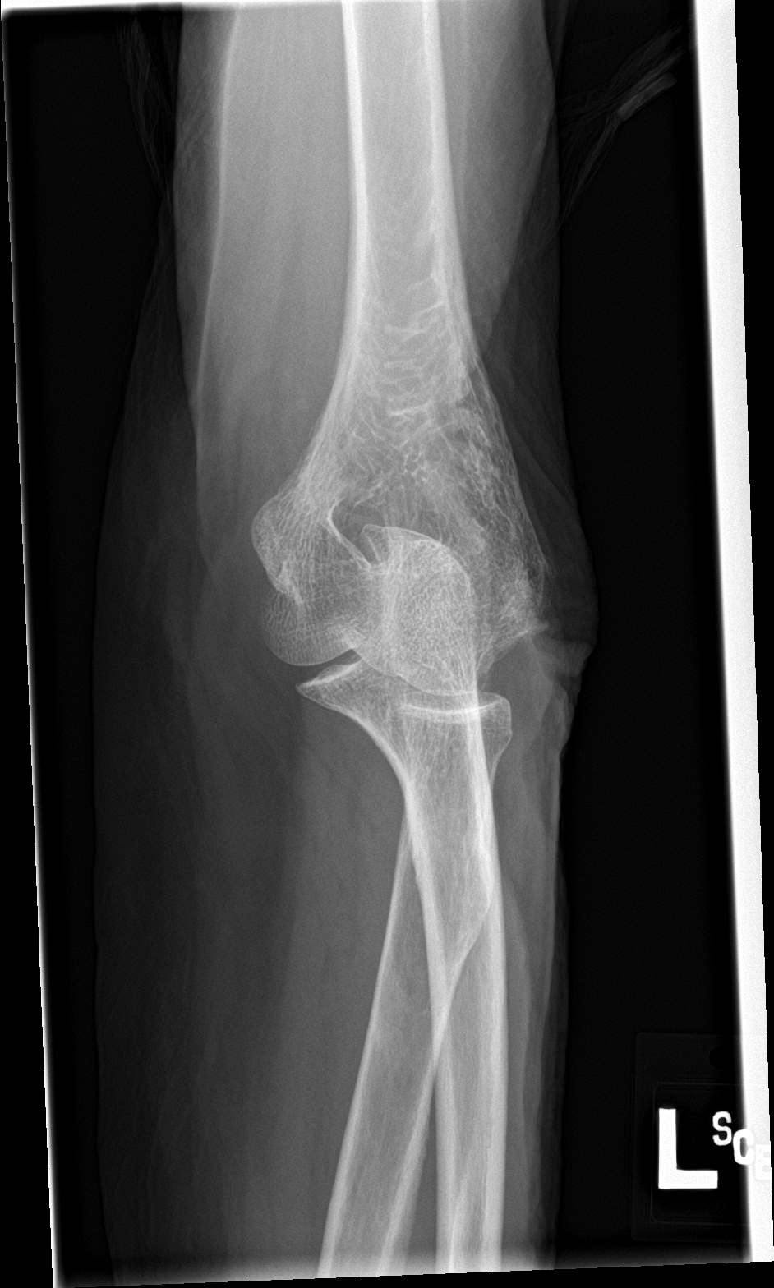

[elbow obl (2 of 2)]
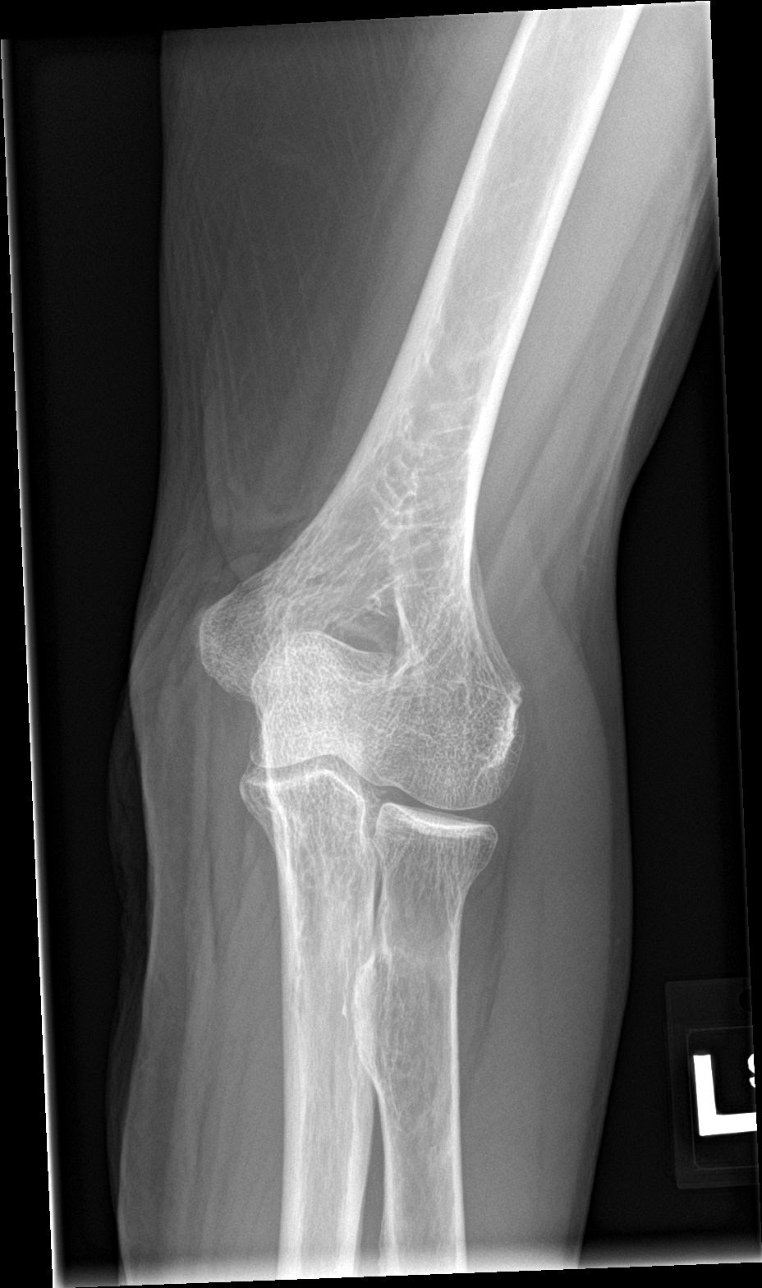

[elbow lat]
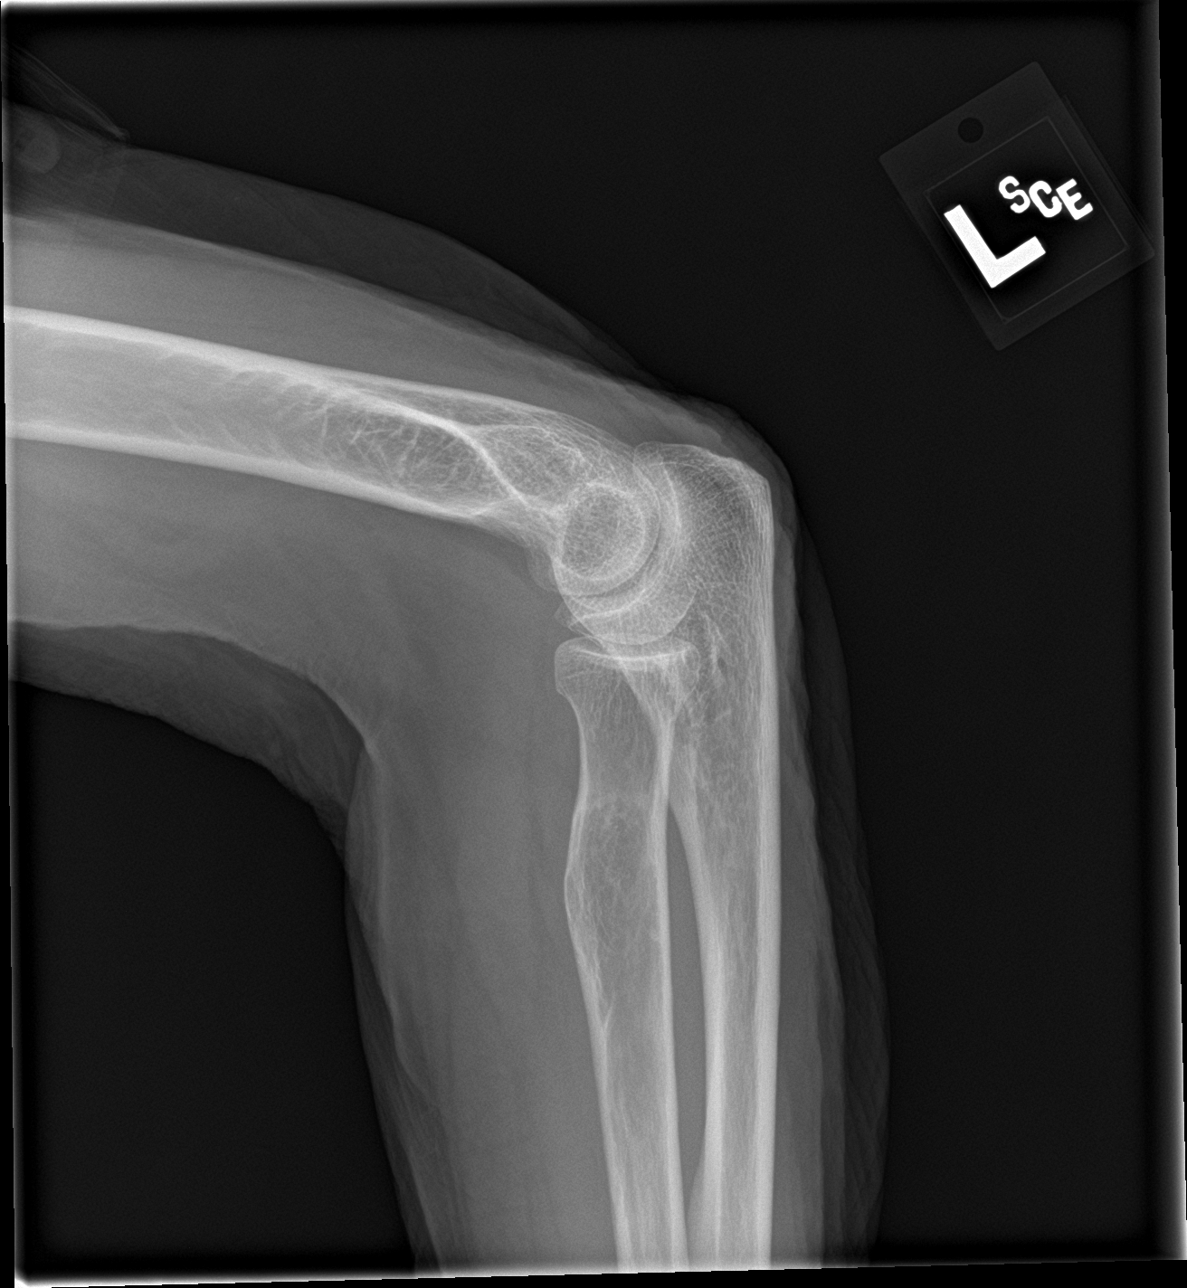

[4 of 4 positions shown; findings below may reference images not displayed]

FINDINGS: There is no evidence of fracture, dislocation, or joint effusion.
There is generalized osteopenia. There is no evidence of arthropathy
or other focal bone abnormality. Soft tissues are unremarkable.
IMPRESSION: No acute osseous injury of the left elbow.

## 2018-12-03 NOTE — Progress Notes (Signed)
Cath Change/ Replacement  Patient is present today for a catheter change due to urinary retention.  1ml of water was removed from the balloon, a 16FR foley cath was removed with out difficulty.  Patient was cleaned and prepped in a sterile fashion with betadine. A 16 FR foley cath was replaced into the bladder no complications were noted Urine return was noted 22ml and urine was clear yellow in color. The balloon was filled with 13ml of sterile water. A leg bag was attached for drainage.  A night bag and an additional leg bag were also given to the patient and patient was given instruction on how to change from one bag to another. Patient was given proper instruction on catheter care.    Performed by: Zara Council, PA-C  Follow up: Return in about 1 month (around 01/03/2019) for catheter exchange .

## 2018-12-04 ENCOUNTER — Encounter: Payer: Self-pay | Admitting: Urology

## 2018-12-04 ENCOUNTER — Ambulatory Visit (INDEPENDENT_AMBULATORY_CARE_PROVIDER_SITE_OTHER): Payer: Medicare Other | Admitting: Urology

## 2018-12-04 ENCOUNTER — Other Ambulatory Visit: Payer: Self-pay

## 2018-12-04 VITALS — BP 164/68 | HR 62 | Ht 66.0 in | Wt 130.0 lb

## 2018-12-04 DIAGNOSIS — R339 Retention of urine, unspecified: Secondary | ICD-10-CM

## 2018-12-27 ENCOUNTER — Encounter: Payer: Self-pay | Admitting: Emergency Medicine

## 2018-12-27 ENCOUNTER — Emergency Department
Admission: EM | Admit: 2018-12-27 | Discharge: 2018-12-27 | Disposition: A | Discharge status: Home / Self Care | Payer: Medicare Other | Attending: Emergency Medicine | Admitting: Emergency Medicine

## 2018-12-27 ENCOUNTER — Other Ambulatory Visit: Payer: Self-pay

## 2018-12-27 DIAGNOSIS — I10 Essential (primary) hypertension: Secondary | ICD-10-CM | POA: Diagnosis not present

## 2018-12-27 DIAGNOSIS — Z79899 Other long term (current) drug therapy: Secondary | ICD-10-CM | POA: Diagnosis not present

## 2018-12-27 DIAGNOSIS — Z7901 Long term (current) use of anticoagulants: Secondary | ICD-10-CM | POA: Diagnosis not present

## 2018-12-27 DIAGNOSIS — Z7984 Long term (current) use of oral hypoglycemic drugs: Secondary | ICD-10-CM | POA: Diagnosis not present

## 2018-12-27 DIAGNOSIS — E039 Hypothyroidism, unspecified: Secondary | ICD-10-CM | POA: Insufficient documentation

## 2018-12-27 DIAGNOSIS — E119 Type 2 diabetes mellitus without complications: Secondary | ICD-10-CM | POA: Insufficient documentation

## 2018-12-27 DIAGNOSIS — T83011A Breakdown (mechanical) of indwelling urethral catheter, initial encounter: Secondary | ICD-10-CM

## 2018-12-27 DIAGNOSIS — Y732 Prosthetic and other implants, materials and accessory gastroenterology and urology devices associated with adverse incidents: Secondary | ICD-10-CM | POA: Diagnosis not present

## 2018-12-27 DIAGNOSIS — N3090 Cystitis, unspecified without hematuria: Secondary | ICD-10-CM | POA: Insufficient documentation

## 2018-12-27 LAB — CBC
HCT: 31.1 % — ABNORMAL LOW (ref 36.0–46.0)
Hemoglobin: 9.6 g/dL — ABNORMAL LOW (ref 12.0–15.0)
MCH: 27.5 pg (ref 26.0–34.0)
MCHC: 30.9 g/dL (ref 30.0–36.0)
MCV: 89.1 fL (ref 80.0–100.0)
Platelets: 347 10*3/uL (ref 150–400)
RBC: 3.49 MIL/uL — ABNORMAL LOW (ref 3.87–5.11)
RDW: 17.6 % — ABNORMAL HIGH (ref 11.5–15.5)
WBC: 6.7 10*3/uL (ref 4.0–10.5)
nRBC: 0 % (ref 0.0–0.2)

## 2018-12-27 LAB — URINALYSIS, COMPLETE (UACMP) WITH MICROSCOPIC
Bilirubin Urine: NEGATIVE
Glucose, UA: NEGATIVE mg/dL
Ketones, ur: NEGATIVE mg/dL
Nitrite: POSITIVE — AB
Protein, ur: 100 mg/dL — AB
Specific Gravity, Urine: 1.011 (ref 1.005–1.030)
pH: 7 (ref 5.0–8.0)

## 2018-12-27 LAB — BASIC METABOLIC PANEL
Anion gap: 7 (ref 5–15)
BUN: 16 mg/dL (ref 8–23)
CO2: 25 mmol/L (ref 22–32)
Calcium: 8.5 mg/dL — ABNORMAL LOW (ref 8.9–10.3)
Chloride: 105 mmol/L (ref 98–111)
Creatinine, Ser: 0.74 mg/dL (ref 0.44–1.00)
GFR calc Af Amer: >60 mL/min (ref >60)
GFR calc non Af Amer: >60 mL/min (ref >60)
Glucose, Bld: 81 mg/dL (ref 70–99)
Potassium: 4.3 mmol/L (ref 3.5–5.1)
Sodium: 137 mmol/L (ref 135–145)

## 2018-12-27 MED ORDER — CEFPODOXIME PROXETIL 200 MG PO TABS: 200.0000 mg | ORAL_TABLET | Freq: Two times a day (BID) | ORAL | 0 refills | Status: AC

## 2018-12-27 NOTE — ED Triage Notes (Signed)
States has had urinary catheter for 4 years. States was initially for incontinence. States this am noted leaking around catheter and not having unine in bag. States she feels like her bladder is full.

## 2018-12-27 NOTE — ED Notes (Signed)
PT to ER with c/o no drainage in urinary bag and leaking around catheter.  Attempted to flush catheter with NS, catheter will not flush or pull back.  Old catheter removed, new catheter replaced without difficulty, immediate urine return noted, pt states feels better at this time.

## 2018-12-27 NOTE — Discharge Instructions (Addendum)
Your urine was concerning for UTI.  We replaced her catheter we will start you on some antibiotics.  We will give you a stronger antibiotic given your history of catheter used to ensure that it helps clear it up.  However you should follow-up with your primary care doctor in 2 days for urine culture check to make sure that it is sensitive to this antibiotic.  Return to the ER for fevers, worsening symptoms or any other concerns

## 2018-12-27 NOTE — ED Provider Notes (Signed)
[facility] Emergency Department Provider Note  ____________________________________________   First MD Initiated Contact with Patient 12/27/18 1041     (approximate)  I have reviewed the triage vital signs and the nursing notes.   HISTORY  Chief Complaint urinary catheter problems    HPI Yolanda Cantu is a 85 y.o. female with atrial fibrillation on warfarin, diabetes, hypertension, chronic indwelling catheter who presents with catheter issues.  Patient states that she went to bed last night and she noted this morning there is no urine.  She had a little bit of abdominal discomfort that was mild, constant, nothing made it better, nothing makes it worse.  She last had a catheter change 3 weeks ago.  Denies any urinary symptoms prior to this.          Past Medical History:  Diagnosis Date   Arthritis    knees   Atrial fibrillation (HCC)    Diabetes mellitus without complication (HCC)    Hypercholesteremia    Hypertension    Hypothyroidism    Thyroid disease    Wears dentures    partial lower    Patient Active Problem List   Diagnosis Date Noted   DNR (do not resuscitate) 08/06/2017   History of glaucoma 08/06/2017   Chronic indwelling Foley catheter 01/24/2017   Urinary retention 12/18/2016   Encounter for general adult medical examination without abnormal findings 01/16/2016   Vaccine counseling 01/16/2016   Closed right hip fracture (HCC) 09/23/2015   Diabetes (HCC) 09/23/2015   HTN (hypertension) 09/23/2015   Hypothyroidism 09/23/2015   Atrial fibrillation (HCC) 09/23/2015   Pure hypercholesterolemia 10/18/2014   B12 deficiency 08/24/2013   Cobalamin deficiency 08/24/2013    Past Surgical History:  Procedure Laterality Date   ABDOMINAL HYSTERECTOMY     APPENDECTOMY     CATARACT EXTRACTION W/PHACO Left 05/09/2015   Procedure: CATARACT EXTRACTION PHACO AND INTRAOCULAR LENS PLACEMENT ([facility]) left eye;   Surgeon: Sherald HessAnita Prakash Vin-Parikh, MD;  [facility];  Service: Ophthalmology;  Laterality: Left;  DIABETIC - oral meds per pt 8 or after for arrival time   CHOLECYSTECTOMY     INTRAMEDULLARY (IM) NAIL INTERTROCHANTERIC Right 09/25/2015   Procedure: INTRAMEDULLARY (IM) NAIL INTERTROCHANTRIC;  Surgeon: Juanell FairlyKevin Krasinski, MD;  Location: [facility];  Service: Orthopedics;  Laterality: Right;   SPLENECTOMY     SPLENECTOMY     TONSILLECTOMY     TOTAL KNEE ARTHROPLASTY Left     Prior to Admission medications   Medication Sig Start Date End Date Taking? Authorizing Provider  acetaminophen (TYLENOL) 500 MG tablet Take 500 mg by mouth every 6 (six) hours as needed.    [provider]  atorvastatin (LIPITOR) 40 MG tablet Take 20 mg by mouth daily.    [provider]  COMBIGAN 0.2-0.5 % ophthalmic solution INSTILL 1 DROP INTO BOTH EYES TWICE A DAY 03/23/18   [provider]  diltiazem (CARDIZEM CD) 240 MG 24 hr capsule TAKE 1 CAPSULE BY MOUTH ONCE A DAY 01/16/16   [provider]  latanoprost (XALATAN) 0.005 % ophthalmic solution INSTILL ONE DROP IN BOTH EYES AT BEDTIME. 03/23/18   [provider]  levothyroxine (SYNTHROID, LEVOTHROID) 125 MCG tablet Take 125 mcg by mouth daily before breakfast.    [provider]  lisinopril (ZESTRIL) 10 MG tablet Take by mouth. 06/09/18 06/09/19  [provider]  metFORMIN (GLUCOPHAGE) 500 MG tablet Take 250 mg by mouth 2 (two) times daily with a meal.  [provider]  oxybutynin (DITROPAN) 5 MG tablet Take 1 tablet (5 mg total) by mouth every 8 (eight) hours as needed for bladder spasms. 04/08/18   Zara Council A, PA-C  warfarin (COUMADIN) 2 MG tablet Take 2 mg by mouth daily.    [provider]    Allergies Patient has no known allergies.  Family History  Problem Relation Age of Onset   Diabetes Father    Stroke Father    Lung cancer Brother    Heart  disease Mother    Lung cancer Sister    Prostate cancer Neg Hx    Kidney cancer Neg Hx    Bladder Cancer Neg Hx     Social History Social History   Tobacco Use   Smoking status: Never Smoker   Smokeless tobacco: Never Used  Substance Use Topics   Alcohol use: No   Drug use: No      Review of Systems Constitutional: No fever/chills Eyes: No visual changes. ENT: No sore throat. Cardiovascular: Denies chest pain. Respiratory: Denies shortness of breath. Gastrointestinal: lower abd pain. No nausea, no vomiting.  No diarrhea.  No constipation. Genitourinary: Negative for dysuria. Catheter not flowing.  Fullness in bladder.  Musculoskeletal: Negative for back pain. Skin: Negative for rash. Neurological: Negative for headaches, focal weakness or numbness. All other ROS negative ____________________________________________   PHYSICAL EXAM:  VITAL SIGNS: ED Triage Vitals  Enc Vitals Group     BP 12/27/18 1026 (!) 177/94     Pulse Rate 12/27/18 1022 (!) 110     Resp 12/27/18 1022 20     Temp 12/27/18 1022 97.7 F (36.5 C)     Temp src --      SpO2 12/27/18 1022 99 %     Weight 12/27/18 1024 130 lb (59 kg)     Height 12/27/18 1024 5\' 3"  (1.6 m)     Head Circumference --      Peak Flow --      Pain Score 12/27/18 1024 6     Pain Loc --      Pain Edu? --      Excl. in [place]? --     Constitutional: Alert and oriented. Well appearing and in no acute distress. Eyes: Conjunctivae are normal. EOMI. Head: Atraumatic. Nose: No congestion/rhinnorhea. Mouth/Throat: Mucous membranes are moist.   Neck: No stridor. Trachea Midline. FROM Cardiovascular: Normal rate, regular rhythm. Grossly normal heart sounds.  Good peripheral circulation. Respiratory: Normal respiratory effort.  No retractions. Lungs CTAB. Gastrointestinal: Soft and nontender. No distention. No abdominal bruits.  Foley catheter in place Musculoskeletal: No lower extremity tenderness nor edema.  No joint  effusions. Neurologic:  Normal speech and language. No gross focal neurologic deficits are appreciated.  Skin:  Skin is warm, dry and intact. No rash noted. Psychiatric: Mood and affect are normal. Speech and behavior are normal. GU: Deferred   ____________________________________________   LABS (all labs ordered are listed, but only abnormal results are displayed)  Labs Reviewed  URINALYSIS, COMPLETE (UACMP) WITH MICROSCOPIC - Abnormal; Notable for the following components:      Result Value   Color, Urine YELLOW (*)    APPearance HAZY (*)    Hgb urine dipstick MODERATE (*)    Protein, ur 100 (*)    Nitrite POSITIVE (*)    Leukocytes,Ua MODERATE (*)    Bacteria, UA MANY (*)    Non Squamous Epithelial PRESENT (*)    All other components within normal limits  CBC - Abnormal; Notable for the following components:   RBC 3.49 (*)    Hemoglobin 9.6 (*)    HCT 31.1 (*)    RDW 17.6 (*)    All other components within normal limits  BASIC METABOLIC PANEL - Abnormal; Notable for the following components:   Calcium 8.5 (*)    All other components within normal limits  URINE CULTURE   ____________________________________________    INITIAL IMPRESSION / ASSESSMENT AND PLAN / ED COURSE  Camya Haydon was evaluated in Emergency Department on 12/27/2018 for the symptoms described in the history of present illness. She was evaluated in the context of the global COVID-19 pandemic, which necessitated consideration that the patient might be at risk for infection with the SARS-CoV-2 virus that causes COVID-19. Institutional protocols and algorithms that pertain to the evaluation of patients at risk for COVID-19 are in a state of rapid change based on information released by regulatory bodies including the CDC and federal and state organizations. These policies and algorithms were followed during the patient's care in the ED.    Nursing staff attempted to flush catheter but was unable to get any  urine out.  Catheter appeared clogged.  Catheter was then replaced.  Patient had 350 cc of urine out.  This is most consistent with urinary retention secondary to clogged catheter.  Will get labs evaluate for AKI, electrolyte abnormalities.  Patient does not have any bleeding to need to to check an INR.  Labs are reassuring with normal kidney function.  No evidence of electrolyte abnormalities.  Hemoglobin slightly low at 9.6 but patient denies any bleeding.  Patient had an INR checked a few days ago that was normal.  Urine is concerning for UTI.  Hard to know if this is just colonization but given the increasing sediment and some bladder discomfort I discussed with patient and she would prefer a course of antibiotics.    Given this could represent a complicated UTI given the catheter in place will give cefpodoxime.  Patient should have a follow-up with her primary care doctor in 2 days for urine culture results.  I discussed the provisional nature of ED diagnosis, the treatment so far, the ongoing plan of care, follow up appointments and return precautions with the patient and any family or support people present. They expressed understanding and agreed with the plan, discharged home.   ____________________________________________   FINAL CLINICAL IMPRESSION(S) / ED DIAGNOSES   Final diagnoses:  Malfunction of Foley catheter, initial encounter (HCC)  Cystitis      MEDICATIONS GIVEN DURING THIS VISIT:  Medications - No data to display   ED Discharge Orders         Ordered    cefpodoxime (VANTIN) 200 MG tablet  2 times daily     12/27/18 1153           Note:  This document was prepared using Dragon voice recognition software and may include unintentional dictation errors.   Concha Se, MD 12/27/18 [phone]

## 2018-12-28 LAB — URINE CULTURE: Culture: >=100000 — AB

## 2018-12-31 ENCOUNTER — Telehealth: Payer: Self-pay | Admitting: Urology

## 2018-12-31 NOTE — Telephone Encounter (Addendum)
LMOM for patient to return call. She can reschedule cath change to 01/26/2019

## 2018-12-31 NOTE — Telephone Encounter (Signed)
Pt was seen in ER and wants to know if she should push out appt 12/8, since they changed cath in ER.

## 2018-12-31 NOTE — Telephone Encounter (Signed)
Pt called office and I r/s appt for 12/28

## 2019-01-06 ENCOUNTER — Ambulatory Visit: Payer: Medicare Other | Admitting: Urology

## 2019-01-26 ENCOUNTER — Other Ambulatory Visit: Payer: Self-pay

## 2019-01-26 ENCOUNTER — Ambulatory Visit (INDEPENDENT_AMBULATORY_CARE_PROVIDER_SITE_OTHER): Payer: Medicare Other | Admitting: Urology

## 2019-01-26 ENCOUNTER — Encounter: Payer: Self-pay | Admitting: Urology

## 2019-01-26 VITALS — BP 118/82 | HR 68 | Ht 60.0 in | Wt 130.0 lb

## 2019-01-26 DIAGNOSIS — R339 Retention of urine, unspecified: Secondary | ICD-10-CM

## 2019-01-26 MED ORDER — OXYBUTYNIN CHLORIDE 5 MG PO TABS: 5.0000 mg | ORAL_TABLET | Freq: Three times a day (TID) | ORAL | 3 refills | Status: DC | PRN

## 2019-01-26 NOTE — Progress Notes (Signed)
Cath Change/ Replacement  Patient is present today for a catheter change due to urinary retention.  10 ml of water was removed from the balloon, a 16 FR foley cath was removed with out difficulty.  Patient was cleaned and prepped in a sterile fashion with betadine and 2% lidocaine jelly was instilled into the urethra. A 16 FR foley cath was replaced into the bladder no complications were noted Urine return was noted 5 ml and urine was yellow in color. The balloon was filled with 57ml of sterile water. A leg bag was attached for drainage.    Performed by: Zara Council, PA-C  Follow up: One month

## 2019-02-24 ENCOUNTER — Other Ambulatory Visit: Payer: Self-pay

## 2019-02-24 ENCOUNTER — Encounter: Payer: Self-pay | Admitting: Urology

## 2019-02-24 ENCOUNTER — Ambulatory Visit (INDEPENDENT_AMBULATORY_CARE_PROVIDER_SITE_OTHER): Payer: Medicare Other | Admitting: Urology

## 2019-02-24 VITALS — BP 118/76 | HR 66 | Ht 60.0 in | Wt 130.0 lb

## 2019-02-24 DIAGNOSIS — R339 Retention of urine, unspecified: Secondary | ICD-10-CM | POA: Diagnosis not present

## 2019-02-24 NOTE — Progress Notes (Signed)
Cath Change/ Replacement Patient is present today for a catheter change due to urinary retention.  10 ml of water was removed from the balloon, a 16 FR foley cath was removed with out difficulty.  Patient was cleaned and prepped in a sterile fashion with betadine. A 16 FR foley cath was replaced into the bladder no complications were noted Foley irrigated with sterile water to ensure correct placement.  The balloon was filled with 23ml of sterile water. A leg bag was attached for drainage.  Patient was given proper instruction on catheter care.    Performed by: Michiel Cowboy, PA-C  Follow up: One month cath change and cysto

## 2019-03-24 ENCOUNTER — Other Ambulatory Visit: Payer: Self-pay

## 2019-03-24 ENCOUNTER — Encounter: Payer: Self-pay | Admitting: Urology

## 2019-03-24 ENCOUNTER — Ambulatory Visit (INDEPENDENT_AMBULATORY_CARE_PROVIDER_SITE_OTHER): Payer: Medicare Other | Admitting: Urology

## 2019-03-24 VITALS — BP 142/83 | HR 73 | Ht 62.0 in | Wt 130.0 lb

## 2019-03-24 DIAGNOSIS — R339 Retention of urine, unspecified: Secondary | ICD-10-CM

## 2019-03-24 NOTE — Progress Notes (Signed)
   03/24/19  CC:  Chief Complaint  Patient presents with  . Cysto    HPI: 86 year old female with chronic urinary retention managed by indwelling Foley catheter who presents today for cystoscopic evaluation.  Please see previous notes from Michiel Cowboy for details.  Blood pressure (!) 142/83, pulse 73, height 5\' 2"  (1.575 m), weight 130 lb (59 kg). NED. A&Ox3.   No respiratory distress   Abd soft, NT, ND Normal external genitalia with patent urethral meatus  Cystoscopy Procedure Note  Patient identification was confirmed, informed consent was obtained, and patient was prepped using Betadine solution.  Lidocaine jelly was administered per urethral meatus.    Procedure: - Flexible cystoscope introduced, without any difficulty.   - Thorough search of the bladder revealed:    normal urethral meatus    normal urothelium with some subtle inflammatory changes near the trigone consistent with cystitis cystica.  No other papillary or suspicious lesions throughout the bladder.  Mild debris.    no stones    no ulcers     no tumors    no urethral polyps    Mild trabeculation with a few saccules  - Ureteral orifices were normal in position and appearance.  Post-Procedure: - Patient tolerated the procedure well  Assessment/ Plan:  1. Urinary retention No evidence of malignancy  Continue to recommend annual cystoscopy given the risk of bladder cancer formation with chronic indwelling catheter.  She is agreeable this plan.  Follow-up with [facility] for next catheter exchange.  [facility], MD

## 2019-03-27 ENCOUNTER — Ambulatory Visit: Payer: Self-pay | Admitting: Urology

## 2019-04-11 ENCOUNTER — Other Ambulatory Visit: Payer: Self-pay

## 2019-04-11 ENCOUNTER — Emergency Department
Admission: EM | Admit: 2019-04-11 | Discharge: 2019-04-11 | Disposition: A | Discharge status: Home / Self Care | Payer: Medicare Other | Attending: Emergency Medicine | Admitting: Emergency Medicine

## 2019-04-11 DIAGNOSIS — R3989 Other symptoms and signs involving the genitourinary system: Secondary | ICD-10-CM | POA: Diagnosis present

## 2019-04-11 DIAGNOSIS — Z7901 Long term (current) use of anticoagulants: Secondary | ICD-10-CM | POA: Insufficient documentation

## 2019-04-11 DIAGNOSIS — T83511A Infection and inflammatory reaction due to indwelling urethral catheter, initial encounter: Secondary | ICD-10-CM | POA: Insufficient documentation

## 2019-04-11 DIAGNOSIS — I10 Essential (primary) hypertension: Secondary | ICD-10-CM | POA: Insufficient documentation

## 2019-04-11 DIAGNOSIS — Z96652 Presence of left artificial knee joint: Secondary | ICD-10-CM | POA: Diagnosis not present

## 2019-04-11 DIAGNOSIS — N390 Urinary tract infection, site not specified: Secondary | ICD-10-CM | POA: Diagnosis not present

## 2019-04-11 DIAGNOSIS — E039 Hypothyroidism, unspecified: Secondary | ICD-10-CM | POA: Diagnosis not present

## 2019-04-11 DIAGNOSIS — Z79899 Other long term (current) drug therapy: Secondary | ICD-10-CM | POA: Insufficient documentation

## 2019-04-11 DIAGNOSIS — E119 Type 2 diabetes mellitus without complications: Secondary | ICD-10-CM | POA: Diagnosis not present

## 2019-04-11 DIAGNOSIS — Z7984 Long term (current) use of oral hypoglycemic drugs: Secondary | ICD-10-CM | POA: Insufficient documentation

## 2019-04-11 DIAGNOSIS — Y732 Prosthetic and other implants, materials and accessory gastroenterology and urology devices associated with adverse incidents: Secondary | ICD-10-CM | POA: Insufficient documentation

## 2019-04-11 LAB — CBC
HCT: 35.1 % — ABNORMAL LOW (ref 36.0–46.0)
Hemoglobin: 10.2 g/dL — ABNORMAL LOW (ref 12.0–15.0)
MCH: 27 pg (ref 26.0–34.0)
MCHC: 29.1 g/dL — ABNORMAL LOW (ref 30.0–36.0)
MCV: 92.9 fL (ref 80.0–100.0)
Platelets: 360 10*3/uL (ref 150–400)
RBC: 3.78 MIL/uL — ABNORMAL LOW (ref 3.87–5.11)
RDW: 18.2 % — ABNORMAL HIGH (ref 11.5–15.5)
WBC: 7.7 10*3/uL (ref 4.0–10.5)
nRBC: 0 % (ref 0.0–0.2)

## 2019-04-11 LAB — BASIC METABOLIC PANEL
Anion gap: 7 (ref 5–15)
BUN: 13 mg/dL (ref 8–23)
CO2: 25 mmol/L (ref 22–32)
Calcium: 8.7 mg/dL — ABNORMAL LOW (ref 8.9–10.3)
Chloride: 106 mmol/L (ref 98–111)
Creatinine, Ser: 0.72 mg/dL (ref 0.44–1.00)
GFR calc Af Amer: >60 mL/min (ref >60)
GFR calc non Af Amer: >60 mL/min (ref >60)
Glucose, Bld: 90 mg/dL (ref 70–99)
Potassium: 5.1 mmol/L (ref 3.5–5.1)
Sodium: 138 mmol/L (ref 135–145)

## 2019-04-11 LAB — URINALYSIS, COMPLETE (UACMP) WITH MICROSCOPIC
Bilirubin Urine: NEGATIVE
Glucose, UA: NEGATIVE mg/dL
Ketones, ur: NEGATIVE mg/dL
Nitrite: POSITIVE — AB
Protein, ur: 100 mg/dL — AB
Specific Gravity, Urine: 1.013 (ref 1.005–1.030)
WBC, UA: >50 WBC/hpf — ABNORMAL HIGH (ref 0–5)
pH: 8 (ref 5.0–8.0)

## 2019-04-11 MED ORDER — CEFPODOXIME PROXETIL 200 MG PO TABS: 200.0000 mg | ORAL_TABLET | Freq: Two times a day (BID) | ORAL | 0 refills | Status: AC

## 2019-04-11 NOTE — ED Triage Notes (Signed)
Pt states she has a bladder infection. States has an indwelling foley x 4 years. Stated has been taking pills for bladder spasms but hasn't helped. States foley has been leaking at the urethra for a few days. States hx of same. States antibiotics "cleared it right up". A&O.

## 2019-04-11 NOTE — ED Provider Notes (Signed)
[facility] Emergency Department Provider Note   ____________________________________________    I have reviewed the triage vital signs and the nursing notes.   HISTORY  Chief Complaint Urinary Tract Infection     HPI Yolanda Cantu is a 86 y.o. female with history of atrial fibrillation, diabetes, hypertension, indwelling Foley catheter which she has had for many years presents with complaints of urinary tract infection.  Patient reports that when she stands up she feels like her bladder is going to drop and she has significant discomfort.  She reports this is consistent with prior urinary tract infections, and always resolves with antibiotics.  She is scheduled for Foley catheter replacement in 9 days.  No fevers or chills.  No nausea or vomiting.  No back pain.  Past Medical History:  Diagnosis Date  . Arthritis    knees  . Atrial fibrillation (HCC)   . Diabetes mellitus without complication (HCC)   . Hypercholesteremia   . Hypertension   . Hypothyroidism   . Thyroid disease   . Wears dentures    partial lower    Patient Active Problem List   Diagnosis Date Noted  . DNR (do not resuscitate) 08/06/2017  . History of glaucoma 08/06/2017  . Chronic indwelling Foley catheter 01/24/2017  . Urinary retention 12/18/2016  . Encounter for general adult medical examination without abnormal findings 01/16/2016  . Vaccine counseling 01/16/2016  . Closed right hip fracture (HCC) 09/23/2015  . Diabetes (HCC) 09/23/2015  . HTN (hypertension) 09/23/2015  . Hypothyroidism 09/23/2015  . Atrial fibrillation (HCC) 09/23/2015  . Pure hypercholesterolemia 10/18/2014  . B12 deficiency 08/24/2013  . Cobalamin deficiency 08/24/2013    Past Surgical History:  Procedure Laterality Date  . ABDOMINAL HYSTERECTOMY    . APPENDECTOMY    . CATARACT EXTRACTION W/PHACO Left 05/09/2015   Procedure: CATARACT EXTRACTION PHACO AND INTRAOCULAR LENS PLACEMENT (IOC) left  eye;  Surgeon: Sherald Hess, MD;  Location: [facility];  Service: Ophthalmology;  Laterality: Left;  DIABETIC - oral meds per pt 8 or after for arrival time  . CHOLECYSTECTOMY    . INTRAMEDULLARY (IM) NAIL INTERTROCHANTERIC Right 09/25/2015   Procedure: INTRAMEDULLARY (IM) NAIL INTERTROCHANTRIC;  Surgeon: Juanell Fairly, MD;  Location: [facility];  Service: Orthopedics;  Laterality: Right;  . SPLENECTOMY    . SPLENECTOMY    . TONSILLECTOMY    . TOTAL KNEE ARTHROPLASTY Left     Prior to Admission medications   Medication Sig Start Date End Date Taking? Authorizing Provider  acetaminophen (TYLENOL) 500 MG tablet Take 500 mg by mouth every 6 (six) hours as needed.    [provider]  atorvastatin (LIPITOR) 40 MG tablet Take 20 mg by mouth daily.    [provider]  cefpodoxime (VANTIN) 200 MG tablet Take 1 tablet (200 mg total) by mouth 2 (two) times daily for 10 days. 04/11/19 04/21/19  Jene Every, MD  COMBIGAN 0.2-0.5 % ophthalmic solution INSTILL 1 DROP INTO BOTH EYES TWICE A DAY 03/23/18   [provider]  diltiazem (CARDIZEM CD) 240 MG 24 hr capsule TAKE 1 CAPSULE BY MOUTH ONCE A DAY 01/16/16   [provider]  latanoprost (XALATAN) 0.005 % ophthalmic solution INSTILL ONE DROP IN BOTH EYES AT BEDTIME. 03/23/18   [provider]  levothyroxine (SYNTHROID, LEVOTHROID) 125 MCG tablet Take 125 mcg by mouth daily before breakfast.    [provider]  lisinopril (ZESTRIL) 10 MG tablet Take by mouth. 06/09/18 06/09/19  [provider]  metFORMIN (GLUCOPHAGE) 500 MG tablet Take 250 mg by mouth 2 (two) times daily with a meal.    [provider]  oxybutynin (DITROPAN) 5 MG tablet Take 1 tablet (5 mg total) by mouth every 8 (eight) hours as needed for bladder spasms. 01/26/19   Zara Council A, PA-C  warfarin (COUMADIN) 2 MG tablet Take 2 mg by mouth daily.    [provider]     Allergies Patient  has no known allergies.  Family History  Problem Relation Age of Onset  . Diabetes Father   . Stroke Father   . Lung cancer Brother   . Heart disease Mother   . Lung cancer Sister   . Prostate cancer Neg Hx   . Kidney cancer Neg Hx   . Bladder Cancer Neg Hx     Social History Social History   Tobacco Use  . Smoking status: Never Smoker  . Smokeless tobacco: Never Used  Substance Use Topics  . Alcohol use: No  . Drug use: No    Review of Systems  Constitutional: No fever/chills Eyes: No visual changes.  ENT: No sore throat. Cardiovascular: Denies chest pain. Respiratory: Denies shortness of breath. Gastrointestinal: As above Genitourinary: As above Musculoskeletal: Negative for back pain. Skin: Negative for rash. Neurological: Negative for headaches or weakness   ____________________________________________   PHYSICAL EXAM:  VITAL SIGNS: ED Triage Vitals  Enc Vitals Group     BP 04/11/19 1156 (!) 152/87     Pulse Rate 04/11/19 1156 68     Resp 04/11/19 1156 16     Temp 04/11/19 1156 98.1 F (36.7 C)     Temp Source 04/11/19 1156 Oral     SpO2 04/11/19 1156 97 %     Weight 04/11/19 1157 59 kg (130 lb)     Height 04/11/19 1157 1.575 m (5\' 2" )     Head Circumference --      Peak Flow --      Pain Score 04/11/19 1202 0     Pain Loc --      Pain Edu? --      Excl. in [place]? --     Constitutional: Alert and oriented.    Mouth/Throat: Mucous membranes are moist.    Cardiovascular: Normal rate, regular rhythm.  Good peripheral circulation. Respiratory: Normal respiratory effort.  No retractions. Gastrointestinal: Soft and nontender. No distention.  No CVA tenderness. Genitourinary: Indwelling Foley catheter Musculoskeletal:   Warm and well perfused Neurologic:  Normal speech and language. No gross focal neurologic deficits are appreciated.  Skin:  Skin is warm, dry and intact. No rash noted. Psychiatric: Mood and affect are normal. Speech and behavior  are normal.  ____________________________________________   LABS (all labs ordered are listed, but only abnormal results are displayed)  Labs Reviewed  BASIC METABOLIC PANEL - Abnormal; Notable for the following components:      Result Value   Calcium 8.7 (*)    All other components within normal limits  CBC - Abnormal; Notable for the following components:   RBC 3.78 (*)    Hemoglobin 10.2 (*)    HCT 35.1 (*)    MCHC 29.1 (*)    RDW 18.2 (*)    All other components within normal limits  URINALYSIS, COMPLETE (UACMP) WITH MICROSCOPIC   ____________________________________________  EKG  None ____________________________________________  RADIOLOGY  None ____________________________________________   PROCEDURES  Procedure(s) performed: No  Procedures   Critical Care performed: No ____________________________________________  INITIAL IMPRESSION / ASSESSMENT AND PLAN / ED COURSE  Pertinent labs & imaging results that were available during my care of the patient were reviewed by me and considered in my medical decision making (see chart for details).  Patient presents with symptoms consistent with urinary tract infection for her, lab work thus far is reassuring, urinalysis sent, cultures typically demonstrate polymicrobial growth.  Patient has had good success with Cefpodoxime in the past, will prescribe this for 10 days, outpatient follow-up as needed, return precautions discussed    ____________________________________________   FINAL CLINICAL IMPRESSION(S) / ED DIAGNOSES  Final diagnoses:  Urinary tract infection associated with indwelling urethral catheter, initial encounter [facility])        Note:  This document was prepared using Dragon voice recognition software and may include unintentional dictation errors.   Jene Every, MD 04/11/19 1308

## 2019-04-21 ENCOUNTER — Encounter: Payer: Self-pay | Admitting: Urology

## 2019-04-21 ENCOUNTER — Other Ambulatory Visit: Payer: Self-pay

## 2019-04-21 ENCOUNTER — Ambulatory Visit (INDEPENDENT_AMBULATORY_CARE_PROVIDER_SITE_OTHER): Payer: Medicare Other | Admitting: Urology

## 2019-04-21 VITALS — BP 139/88 | HR 125 | Ht 62.0 in | Wt 130.0 lb

## 2019-04-21 DIAGNOSIS — R339 Retention of urine, unspecified: Secondary | ICD-10-CM

## 2019-04-21 NOTE — Progress Notes (Signed)
Cath Change/ Replacement  Patient is present today for a catheter change due to urinary retention.  10 ml of water was removed from the balloon, a 16 FR foley cath was removed with out difficulty.  Patient was cleaned and prepped in a sterile fashion with betadine. A 16 FR foley cath was replaced into the bladder no complications were noted Urine return was noted 20 ml and urine was yellow clear in color. The balloon was filled with 59ml of sterile water. A leg bag was attached for drainage.  Patient was given proper instruction on catheter care.    Performed by: Lajuana Ripple, PA-C and Emelia Salisbury PA-S  Follow up: One month for catheter exchange   She did mention that that was seen in the ED on 04/11/2019 for feelings that her bladder was going to fall out.  UA was grossly positive for infection.  She was started on Keflex x 10 days.  She is feeling better now.

## 2019-05-19 ENCOUNTER — Other Ambulatory Visit: Payer: Self-pay

## 2019-05-19 ENCOUNTER — Ambulatory Visit (INDEPENDENT_AMBULATORY_CARE_PROVIDER_SITE_OTHER): Payer: Medicare Other | Admitting: Urology

## 2019-05-19 DIAGNOSIS — R339 Retention of urine, unspecified: Secondary | ICD-10-CM

## 2019-05-19 NOTE — Progress Notes (Signed)
Cath Change/ Replacement Patient is present today for a catheter change due to urinary retention.  10 ml of water was removed from the balloon, a 16 FR foley cath was removed with out difficulty.  Patient was cleaned and prepped in a sterile fashion with betadine. A 16 FR foley cath was replaced into the bladder no complications were noted Urine return was noted 5 ml and urine was clear in color. The balloon was filled with 41ml of sterile water. A leg bag was attached for drainage.  A night bag was also given to the patient and patient was given instruction on how to change from one bag to another. Patient was given proper instruction on catheter care.    Performed by: Michiel Cowboy, PA-C  Follow up: one month for catheter exchange

## 2019-05-25 ENCOUNTER — Other Ambulatory Visit: Payer: Self-pay

## 2019-05-25 DIAGNOSIS — Z96652 Presence of left artificial knee joint: Secondary | ICD-10-CM | POA: Diagnosis not present

## 2019-05-25 DIAGNOSIS — E119 Type 2 diabetes mellitus without complications: Secondary | ICD-10-CM | POA: Insufficient documentation

## 2019-05-25 DIAGNOSIS — Y732 Prosthetic and other implants, materials and accessory gastroenterology and urology devices associated with adverse incidents: Secondary | ICD-10-CM | POA: Diagnosis not present

## 2019-05-25 DIAGNOSIS — Z7901 Long term (current) use of anticoagulants: Secondary | ICD-10-CM | POA: Diagnosis not present

## 2019-05-25 DIAGNOSIS — Z7984 Long term (current) use of oral hypoglycemic drugs: Secondary | ICD-10-CM | POA: Diagnosis not present

## 2019-05-25 DIAGNOSIS — E039 Hypothyroidism, unspecified: Secondary | ICD-10-CM | POA: Insufficient documentation

## 2019-05-25 DIAGNOSIS — I10 Essential (primary) hypertension: Secondary | ICD-10-CM | POA: Insufficient documentation

## 2019-05-25 DIAGNOSIS — Z79899 Other long term (current) drug therapy: Secondary | ICD-10-CM | POA: Insufficient documentation

## 2019-05-25 DIAGNOSIS — T83011A Breakdown (mechanical) of indwelling urethral catheter, initial encounter: Secondary | ICD-10-CM | POA: Diagnosis not present

## 2019-05-25 NOTE — ED Triage Notes (Addendum)
Pt arrives to ED via POV from home with c/o leaking around her catheter. Pt reports s/x's started around 3-4 pm; pt has chronic catheter use following a broken hip in 2017. Pt has no c/o pain; denies urinary retention (states her only concern is that she needs to place tissue paper or Kleenex around the catheter to control leakage). Pt also denies N/V/D or fever. Pt is A&O, in NAD; RR even, regular, and unlabored.

## 2019-05-26 ENCOUNTER — Emergency Department
Admission: EM | Admit: 2019-05-26 | Discharge: 2019-05-26 | Disposition: A | Discharge status: Home / Self Care | Payer: Medicare Other | Attending: Emergency Medicine | Admitting: Emergency Medicine

## 2019-05-26 DIAGNOSIS — T83011A Breakdown (mechanical) of indwelling urethral catheter, initial encounter: Secondary | ICD-10-CM

## 2019-05-26 NOTE — ED Provider Notes (Signed)
[facility] Emergency Department Provider Note  ____________________________________________   First MD Initiated Contact with Patient 05/26/19 [phone]     (approximate)  I have reviewed the triage vital signs and the nursing notes.   HISTORY  Chief Complaint No chief complaint on file.    HPI Yolanda Cantu is a 86 y.o. female with medical history as listed below which notably includes having a chronic indwelling Foley catheter and chronic urinary retention.  She is followed by Michiel Cowboy at [facility].  She presents tonight because of urine leaking around the Foley.  She said that she has had to put tissue paper and Kleenex around the catheter acute from leaking too much.  Then just prior to arrival in the emergency department she had a "gush" of urine that went down both legs.  This scared her so she came to the ED.  She has had some urine output into the leg bag since coming to the ED many hours ago and has had minimal additional leakage but still had some.  She has no pain.  She has no abdominal distention.  She denies fever, sore throat, chest pain, shortness of breath, nausea, vomiting, and abdominal pain.  The onset of the symptoms was rather acute tonight as she describes it as severe.  She said that her Foley was last changed about a week ago.         Past Medical History:  Diagnosis Date  . Arthritis    knees  . Atrial fibrillation (HCC)   . Diabetes mellitus without complication (HCC)   . Hypercholesteremia   . Hypertension   . Hypothyroidism   . Thyroid disease   . Wears dentures    partial lower    Patient Active Problem List   Diagnosis Date Noted  . DNR (do not resuscitate) 08/06/2017  . History of glaucoma 08/06/2017  . Chronic indwelling Foley catheter 01/24/2017  . Urinary retention 12/18/2016  . Encounter for general adult medical examination without abnormal findings 01/16/2016  . Vaccine counseling  01/16/2016  . Closed right hip fracture (HCC) 09/23/2015  . Diabetes (HCC) 09/23/2015  . HTN (hypertension) 09/23/2015  . Hypothyroidism 09/23/2015  . Atrial fibrillation (HCC) 09/23/2015  . Pure hypercholesterolemia 10/18/2014  . B12 deficiency 08/24/2013  . Cobalamin deficiency 08/24/2013    Past Surgical History:  Procedure Laterality Date  . ABDOMINAL HYSTERECTOMY    . APPENDECTOMY    . CATARACT EXTRACTION W/PHACO Left 05/09/2015   Procedure: CATARACT EXTRACTION PHACO AND INTRAOCULAR LENS PLACEMENT (IOC) left eye;  Surgeon: Sherald Hess, MD;  Location: [facility];  Service: Ophthalmology;  Laterality: Left;  DIABETIC - oral meds per pt 8 or after for arrival time  . CHOLECYSTECTOMY    . INTRAMEDULLARY (IM) NAIL INTERTROCHANTERIC Right 09/25/2015   Procedure: INTRAMEDULLARY (IM) NAIL INTERTROCHANTRIC;  Surgeon: Juanell Fairly, MD;  Location: [facility];  Service: Orthopedics;  Laterality: Right;  . SPLENECTOMY    . SPLENECTOMY    . TONSILLECTOMY    . TOTAL KNEE ARTHROPLASTY Left     Prior to Admission medications   Medication Sig Start Date End Date Taking? Authorizing Provider  acetaminophen (TYLENOL) 500 MG tablet Take 500 mg by mouth every 6 (six) hours as needed.    [provider]  atorvastatin (LIPITOR) 40 MG tablet Take 20 mg by mouth daily.    [provider]  COMBIGAN 0.2-0.5 % ophthalmic solution INSTILL 1 DROP INTO BOTH EYES TWICE A DAY  03/23/18   [provider]  diltiazem (CARDIZEM CD) 240 MG 24 hr capsule TAKE 1 CAPSULE BY MOUTH ONCE A DAY 01/16/16   [provider]  diltiazem (TIAZAC) 240 MG 24 hr capsule TAKE 1 CAPSULE BY MOUTH EVERY DAY 03/27/19   [provider]  latanoprost (XALATAN) 0.005 % ophthalmic solution INSTILL ONE DROP IN BOTH EYES AT BEDTIME. 03/23/18   [provider]  levothyroxine (SYNTHROID, LEVOTHROID) 125 MCG tablet Take 125 mcg by mouth daily before breakfast.    [provider]  lisinopril (ZESTRIL) 20 MG tablet Take 20 mg by mouth daily. 03/24/19   [provider]  metFORMIN (GLUCOPHAGE) 500 MG tablet Take 250 mg by mouth 2 (two) times daily with a meal.    [provider]  oxybutynin (DITROPAN) 5 MG tablet Take 1 tablet (5 mg total) by mouth every 8 (eight) hours as needed for bladder spasms. 01/26/19   Michiel Cowboy A, PA-C  warfarin (COUMADIN) 2 MG tablet Take 2 mg by mouth daily.    [provider]    Allergies Patient has no known allergies.  Family History  Problem Relation Age of Onset  . Diabetes Father   . Stroke Father   . Lung cancer Brother   . Heart disease Mother   . Lung cancer Sister   . Prostate cancer Neg Hx   . Kidney cancer Neg Hx   . Bladder Cancer Neg Hx     Social History Social History   Tobacco Use  . Smoking status: Never Smoker  . Smokeless tobacco: Never Used  Substance Use Topics  . Alcohol use: No  . Drug use: No    Review of Systems Constitutional: No fever/chills Eyes: No visual changes. ENT: No sore throat. Cardiovascular: Denies chest pain. Respiratory: Denies shortness of breath. Gastrointestinal: No abdominal pain.  No nausea, no vomiting.  No diarrhea.  No constipation. Genitourinary: Urine leaking around Foley catheter. Musculoskeletal: Negative for neck pain.  Negative for back pain. Integumentary: Negative for rash. Neurological: Negative for headaches, focal weakness or numbness.   ____________________________________________   PHYSICAL EXAM:  ED Triage Vitals  Enc Vitals Group     BP 05/26/19 0605 (!) 140/98     Pulse Rate 05/26/19 0605 89     Resp 05/26/19 0605 16     Temp 05/26/19 0605 98.5 F (36.9 C)     Temp Source 05/26/19 0605 Oral     SpO2 05/26/19 0605 97 %     Weight --      Height --      Head Circumference --      Peak Flow --      Pain Score 05/25/19 2217 0     Pain Loc --      Pain Edu? --      Excl. in [place]? --       Constitutional: Alert and oriented.  Eyes: Conjunctivae are normal.  Head: Atraumatic. Nose: No congestion/rhinnorhea. Mouth/Throat: Patient is wearing a mask. Neck: No stridor.  No meningeal signs.   Cardiovascular: Normal rate, regular rhythm. Good peripheral circulation. Grossly normal heart sounds. Respiratory: Normal respiratory effort.  No retractions. Gastrointestinal: Soft and nontender. No distention.  Genitourinary: Indwelling Foley catheter, no visible external abnormalities including no vesicular prolapse.  Catheter is well-appearing with clear yellow urine in the leg bag. Musculoskeletal: No lower extremity tenderness nor edema. No gross deformities of extremities. Neurologic:  Normal speech and language. No gross focal neurologic deficits are appreciated.  Skin:  Skin is warm, dry and intact. Psychiatric: Mood and affect are normal. Speech and behavior are normal.  ____________________________________________   LABS (all labs ordered are listed, but only abnormal results are displayed)  Labs Reviewed - No data to display ____________________________________________  EKG  No indication for EKG ____________________________________________  RADIOLOGY I, Hinda Kehr, personally viewed and evaluated these images (plain radiographs) as part of my medical decision making, as well as reviewing the written report by the radiologist.  ED MD interpretation: No indication for emergent imaging  Official radiology report(s): No results found.  ____________________________________________   PROCEDURES   Procedure(s) performed (including Critical Care):  Procedures   ____________________________________________   INITIAL IMPRESSION / MDM / [place] / ED COURSE  As part of my medical decision making, I reviewed the following data within the [place] notes reviewed and incorporated, Old chart reviewed and Notes from prior ED  visits   Differential diagnosis includes, but is not limited to, urinary catheter malfunction, fascicular prolapse, UTI.  The patient has no signs or symptoms of a UTI with stable and normal vital signs and she is afebrile.  She has a chronic Foley catheter and the standard of care is generally not to check a urinalysis unless there is strong clinical reason to do so.  She is followed closely at [facility] and I will recommend a phone call to follow closely.  We will replace the Foley catheter tonight assuming catheter malfunction.  She has no signs of urinary retention at this point, no abdominal tenderness, and no other concerning findings on physical exam.  She is comfortable with the plan.       Clinical Course as of May 25 645  Tue May 26, 2019  [id number] Successful placement of the indwelling 16 French Foley catheter with no complications.  Patient feels well, is ambulating without difficulty, and has gotten dressed and is ready to go.  She will follow-up as an outpatient.   [CF]    Clinical Course User Index [CF] Hinda Kehr, MD     ____________________________________________  FINAL CLINICAL IMPRESSION(S) / ED DIAGNOSES  Final diagnoses:  Malfunction of Foley catheter, initial encounter [facility])     MEDICATIONS GIVEN DURING THIS VISIT:  Medications - No data to display   ED Discharge Orders    None      *Please note:  Yolanda Cantu was evaluated in Emergency Department on 05/26/2019 for the symptoms described in the history of present illness. She was evaluated in the context of the global COVID-19 pandemic, which necessitated consideration that the patient might be at risk for infection with the SARS-CoV-2 virus that causes COVID-19. Institutional protocols and algorithms that pertain to the evaluation of patients at risk for COVID-19 are in a state of rapid change based on information released by regulatory bodies including the CDC and federal and state  organizations. These policies and algorithms were followed during the patient's care in the ED.  Some ED evaluations and interventions may be delayed as a result of limited staffing during the pandemic.*  Note:  This document was prepared using Dragon voice recognition software and may include unintentional dictation errors.   Hinda Kehr, MD 05/26/19 [phone]

## 2019-05-27 ENCOUNTER — Telehealth: Payer: Self-pay | Admitting: Urology

## 2019-05-27 NOTE — Telephone Encounter (Signed)
Pt left vm she states she went to ER Sunday night and they did not giver any antibiotics or do anything she feels as if her bladder is falling to the ground and would like for [place] to call in antibiotics for her

## 2019-05-28 ENCOUNTER — Ambulatory Visit (INDEPENDENT_AMBULATORY_CARE_PROVIDER_SITE_OTHER): Payer: Medicare Other | Admitting: Urology

## 2019-05-28 ENCOUNTER — Other Ambulatory Visit: Payer: Self-pay

## 2019-05-28 DIAGNOSIS — N3289 Other specified disorders of bladder: Secondary | ICD-10-CM

## 2019-05-28 DIAGNOSIS — N390 Urinary tract infection, site not specified: Secondary | ICD-10-CM

## 2019-05-28 DIAGNOSIS — Z978 Presence of other specified devices: Secondary | ICD-10-CM

## 2019-05-28 DIAGNOSIS — R300 Dysuria: Secondary | ICD-10-CM

## 2019-05-28 MED ORDER — MIRABEGRON ER 50 MG PO TB24: 50.0000 mg | ORAL_TABLET | Freq: Every day | ORAL | 0 refills | Status: AC

## 2019-05-28 NOTE — Progress Notes (Signed)
Patient presents today with complaints of dysuria and leaking around her catheter. I plugged the catheter and had the patient drink water. Urine was collected for UA, UCX.

## 2019-06-01 LAB — URINALYSIS, COMPLETE
Bilirubin, UA: NEGATIVE
Glucose, UA: NEGATIVE
Ketones, UA: NEGATIVE
Nitrite, UA: POSITIVE — AB
Specific Gravity, UA: >1.03 — ABNORMAL HIGH (ref 1.005–1.030)
Urobilinogen, Ur: 0.2 mg/dL (ref 0.2–1.0)
pH, UA: 6 (ref 5.0–7.5)

## 2019-06-01 LAB — MICROSCOPIC EXAMINATION: WBC, UA: >30 /hpf — AB (ref 0–5)

## 2019-06-03 ENCOUNTER — Telehealth: Payer: Self-pay | Admitting: Urology

## 2019-06-03 LAB — CULTURE, URINE COMPREHENSIVE

## 2019-06-03 NOTE — Telephone Encounter (Signed)
Would you call Yolanda Cantu and see how she is doing in regards to her bladder spasms?

## 2019-06-04 NOTE — Telephone Encounter (Signed)
Spoke to patient and she states the samples have helped with the spasms.

## 2019-06-17 ENCOUNTER — Encounter: Payer: Self-pay | Admitting: Urology

## 2019-06-17 NOTE — Progress Notes (Signed)
05/28/2019 10:18 AM   Ginette Otto 05-12-1925 [id number]  Referring provider: Marisue Ivan, MD [phone] [facility] [address] [facility] [place],  [place] [address]  Chief Complaint  Patient presents with  . Dysuria    HPI: Mrs. Kenealy is a 86 year old female with an indwelling Foley presents today after being seen in the ED for catheter leaking.  She states that she has been having leakage around her Foley catheter requiring her to use toilet paper to absorb the urine. She did experience an gush of urine that went down both her legs which caused her great concern and that is why she sought treatment in the ED. The Foley catheter was exchanged in the emergency room without difficulty and she was instructed to contact our office.  When she called the office, she stated that the felt like her bladder was falling to the ground and she did not receive any antibiotics in the ED but is requesting for [place] to send her in antibiotics.  Today, she states she is starting to feel better. She has not had any further leakage around the catheter. Her Foley catheter is in place draining yellow urine.  Patient denies any modifying or aggravating factors.  Patient denies any gross hematuria or suprapubic/flank pain.  Patient denies any fevers, chills, nausea or vomiting.   Her UA yellow cloudy, nitrite positive, > 30 WBC's, 11-30 RBC's and moderate bacteria.  PMH: Past Medical History:  Diagnosis Date  . Arthritis    knees  . Atrial fibrillation (HCC)   . Diabetes mellitus without complication (HCC)   . Hypercholesteremia   . Hypertension   . Hypothyroidism   . Thyroid disease   . Wears dentures    partial lower    Surgical History: Past Surgical History:  Procedure Laterality Date  . ABDOMINAL HYSTERECTOMY    . APPENDECTOMY    . CATARACT EXTRACTION W/PHACO Left 05/09/2015   Procedure: CATARACT EXTRACTION PHACO AND INTRAOCULAR LENS PLACEMENT (IOC) left eye;  Surgeon: Sherald Hess, MD;  Location: [facility];  Service: Ophthalmology;  Laterality: Left;  DIABETIC - oral meds per pt 8 or after for arrival time  . CHOLECYSTECTOMY    . INTRAMEDULLARY (IM) NAIL INTERTROCHANTERIC Right 09/25/2015   Procedure: INTRAMEDULLARY (IM) NAIL INTERTROCHANTRIC;  Surgeon: Juanell Fairly, MD;  Location: [facility];  Service: Orthopedics;  Laterality: Right;  . SPLENECTOMY    . SPLENECTOMY    . TONSILLECTOMY    . TOTAL KNEE ARTHROPLASTY Left     Home Medications:  Allergies as of 05/28/2019   No Known Allergies     Medication List       Accurate as of May 28, 2019 11:59 PM. If you have any questions, ask your nurse or doctor.        acetaminophen 500 MG tablet Commonly known as: TYLENOL Take 500 mg by mouth every 6 (six) hours as needed.   atorvastatin 40 MG tablet Commonly known as: LIPITOR Take 20 mg by mouth daily.   Combigan 0.2-0.5 % ophthalmic solution Generic drug: brimonidine-timolol INSTILL 1 DROP INTO BOTH EYES TWICE A DAY   diltiazem 240 MG 24 hr capsule Commonly known as: CARDIZEM CD TAKE 1 CAPSULE BY MOUTH ONCE A DAY   diltiazem 240 MG 24 hr capsule Commonly known as: TIAZAC TAKE 1 CAPSULE BY MOUTH EVERY DAY   latanoprost 0.005 % ophthalmic solution Commonly known as: XALATAN INSTILL ONE DROP IN BOTH EYES AT BEDTIME.   levothyroxine  125 MCG tablet Commonly known as: SYNTHROID Take 125 mcg by mouth daily before breakfast.   lisinopril 20 MG tablet Commonly known as: ZESTRIL Take 20 mg by mouth daily.   metFORMIN 500 MG tablet Commonly known as: GLUCOPHAGE Take 250 mg by mouth 2 (two) times daily with a meal.   mirabegron ER 50 MG Tb24 tablet Commonly known as: MYRBETRIQ Take 1 tablet (50 mg total) by mouth daily. Started by: Zara Council, PA-C   oxybutynin 5 MG tablet Commonly known as: DITROPAN Take 1 tablet (5 mg total) by mouth every 8 (eight) hours as needed for bladder spasms.   warfarin 2 MG  tablet Commonly known as: COUMADIN Take 2 mg by mouth daily.       Allergies: No Known Allergies  Family History: Family History  Problem Relation Age of Onset  . Diabetes Father   . Stroke Father   . Lung cancer Brother   . Heart disease Mother   . Lung cancer Sister   . Prostate cancer Neg Hx   . Kidney cancer Neg Hx   . Bladder Cancer Neg Hx     Social History:  reports that she has never smoked. She has never used smokeless tobacco. She reports that she does not drink alcohol or use drugs.  ROS: Pertinent ROS in HPI  Physical Exam: There were no vitals taken for this visit.  Constitutional:  Well nourished. Alert and oriented, No acute distress. HEENT: [place] AT, mask in place  Trachea midline Cardiovascular: No clubbing, cyanosis, or edema. Respiratory: Normal respiratory effort, no increased work of breathing. GI: Abdomen is soft, non tender, non distended, no abdominal masses.  GU: No CVA tenderness.  No bladder fullness or masses.  Foley in place. Neurologic: Grossly intact, no focal deficits, moving all 4 extremities. Psychiatric: Normal mood and affect.  Laboratory Data: Lab Results  Component Value Date   WBC 7.7 04/11/2019   HGB 10.2 (L) 04/11/2019   HCT 35.1 (L) 04/11/2019   MCV 92.9 04/11/2019   PLT 360 04/11/2019    Lab Results  Component Value Date   CREATININE 0.72 04/11/2019    Lab Results  Component Value Date   AST 27 05/11/2016   Lab Results  Component Value Date   ALT 13 (L) 05/11/2016    Urinalysis Component     Latest Ref Rng & Units 05/28/2019  Specific Gravity, UA     1.005 - 1.030 >1.030 (H)  pH, UA     5.0 - 7.5 6.0  Color, UA     Yellow Yellow  Appearance Ur     Clear Cloudy (A)  Leukocytes,UA     Negative 1+ (A)  Protein,UA     Negative/Trace 2+ (A)  Glucose, UA     Negative Negative  Ketones, UA     Negative Negative  RBC, UA     Negative 3+ (A)  Bilirubin, UA     Negative Negative  Urobilinogen, Ur      0.2 - 1.0 mg/dL 0.2  Nitrite, UA     Negative Positive (A)  Microscopic Examination      See below:   Component     Latest Ref Rng & Units 05/28/2019  WBC, UA     0 - 5 /hpf >30 (A)  RBC     0 - 2 /hpf 11-30 (A)  Epithelial Cells (non renal)     0 - 10 /hpf 0-10  Crystals     N/A Present (A)  Crystal Type     N/A Amorphous Sediment  Bacteria, UA     None seen/Few Moderate (A)    I have reviewed the labs.   Pertinent Imaging: No recent imaging  Assessment & Plan:   1. Dysuria - Urinalysis, Complete - CULTURE, URINE COMPREHENSIVE Discussed with patient colonization of urine vs active infection.  Her symptoms favor bladder spasms rather than an UTI.  I have sent the urine for culture, but will not prescribed an antibiotic unless she develops fevers, chills, leukocytosis or if her bladder symptoms are not improved by the addition of the OAB agent, Myrbetriq 50 mg daily Myrbetriq 50 mg, # 28 samples given   2. Recurrent UTI/Coloniaztion of urine - US RENAL; Future  3. Indwelling Foley RUS ordered as she has had no recent imaging RTC in one month for cath exchange   Return in about 1 month (around 06/27/2019) for Foley exchange .  These notes generated with voice recognition software. I apologize for typographical errors.  Michiel Cowboy, PA-C  [facility] [address]  [address] [place], [place] [address] [phone]

## 2019-06-18 ENCOUNTER — Ambulatory Visit: Payer: Self-pay | Admitting: Urology

## 2019-06-26 ENCOUNTER — Ambulatory Visit: Payer: Self-pay | Admitting: Physician Assistant

## 2019-07-02 ENCOUNTER — Ambulatory Visit: Payer: Self-pay | Admitting: Urology

## 2019-07-02 NOTE — Progress Notes (Signed)
Cath Change/ Replacement  Patient is present today for a catheter change due to urinary retention.  8 ml of water was removed from the balloon, a 16 FR foley cath was removed with out difficulty.  Patient was cleaned and prepped in a sterile fashion with betadine. A 16 FR foley cath was replaced into the bladder no complications were noted Urine return was noted 30 ml and urine was yellow in color. The balloon was filled with 45ml of sterile water. A leg bag was attached for drainage.  A night bag was also given to the patient and patient was given instruction on how to change from one bag to another. Patient was given proper instruction on catheter care.    Performed by: Michiel Cowboy PA-C   Follow up: 1 month cath change   I, Donne Hazel, am acting as a scribe for [employer],  I have reviewed the above documentation for accuracy and completeness, and I agree with the above.    Michiel Cowboy, PA-C

## 2019-07-03 ENCOUNTER — Other Ambulatory Visit: Payer: Self-pay

## 2019-07-03 ENCOUNTER — Ambulatory Visit (INDEPENDENT_AMBULATORY_CARE_PROVIDER_SITE_OTHER): Payer: Medicare Other | Admitting: Urology

## 2019-07-03 ENCOUNTER — Encounter: Payer: Self-pay | Admitting: Urology

## 2019-07-03 DIAGNOSIS — Z978 Presence of other specified devices: Secondary | ICD-10-CM | POA: Diagnosis not present

## 2019-07-03 MED ORDER — OXYBUTYNIN CHLORIDE 5 MG PO TABS: 5.0000 mg | ORAL_TABLET | Freq: Three times a day (TID) | ORAL | 3 refills | Status: AC | PRN

## 2019-08-04 ENCOUNTER — Other Ambulatory Visit: Payer: Self-pay

## 2019-08-04 ENCOUNTER — Ambulatory Visit (INDEPENDENT_AMBULATORY_CARE_PROVIDER_SITE_OTHER): Payer: Medicare Other | Admitting: Physician Assistant

## 2019-08-04 ENCOUNTER — Encounter: Payer: Self-pay | Admitting: Physician Assistant

## 2019-08-04 VITALS — BP 122/77 | HR 67 | Ht 66.0 in | Wt 133.5 lb

## 2019-08-04 DIAGNOSIS — Z978 Presence of other specified devices: Secondary | ICD-10-CM

## 2019-08-04 NOTE — Progress Notes (Signed)
Cath Change/ Replacement  Patient is present today for a catheter change due to urinary retention.  70ml of water was removed from the balloon, a 16FR foley cath was removed without difficulty.  Patient was cleaned and prepped in a sterile fashion with betadine. A 16 FR foley cath was replaced into the bladder no complications were noted Urine return was noted 41ml and urine was yellow in color. The balloon was filled with 73ml of sterile water. A leg bag was attached for drainage.  A leg bag and a night bag were also given to the patient and patient was given instruction on how to change from one bag to another. Patient was given proper instruction on catheter care.    Performed by: Carman Ching, PA-C   Follow up: No follow-ups on file.

## 2019-09-07 ENCOUNTER — Other Ambulatory Visit: Payer: Self-pay

## 2019-09-07 ENCOUNTER — Encounter: Payer: Self-pay | Admitting: Physician Assistant

## 2019-09-07 ENCOUNTER — Ambulatory Visit (INDEPENDENT_AMBULATORY_CARE_PROVIDER_SITE_OTHER): Payer: Medicare Other | Admitting: Physician Assistant

## 2019-09-07 VITALS — BP 105/69 | HR 67 | Ht 66.0 in | Wt 133.0 lb

## 2019-09-07 DIAGNOSIS — R339 Retention of urine, unspecified: Secondary | ICD-10-CM

## 2019-09-07 NOTE — Progress Notes (Signed)
Cath Change/ Replacement  Patient is present today for a catheter change due to urinary retention.  86ml of water was removed from the balloon, a 16FR foley cath was removed without difficulty.  Patient was cleaned and prepped in a sterile fashion with betadine. A 16 FR foley cath was replaced into the bladder no complications were noted Urine return was noted 17ml and urine was yellow in color. The balloon was filled with 68ml of sterile water. A leg bag was attached for drainage.  Patient declined additional drainage bags.   Performed by: Carman Ching, PA-C   Follow up: Return in about 4 weeks (around 10/05/2019) for Catheter exchange.

## 2019-10-11 ENCOUNTER — Other Ambulatory Visit: Payer: Self-pay

## 2019-10-11 ENCOUNTER — Emergency Department
Admission: EM | Admit: 2019-10-11 | Discharge: 2019-10-11 | Disposition: A | Discharge status: Home / Self Care | Payer: Medicare Other | Attending: Emergency Medicine | Admitting: Emergency Medicine

## 2019-10-11 DIAGNOSIS — Z7984 Long term (current) use of oral hypoglycemic drugs: Secondary | ICD-10-CM | POA: Diagnosis not present

## 2019-10-11 DIAGNOSIS — Z79890 Hormone replacement therapy (postmenopausal): Secondary | ICD-10-CM | POA: Diagnosis not present

## 2019-10-11 DIAGNOSIS — E039 Hypothyroidism, unspecified: Secondary | ICD-10-CM | POA: Insufficient documentation

## 2019-10-11 DIAGNOSIS — N3001 Acute cystitis with hematuria: Secondary | ICD-10-CM | POA: Diagnosis not present

## 2019-10-11 DIAGNOSIS — Z96652 Presence of left artificial knee joint: Secondary | ICD-10-CM | POA: Diagnosis not present

## 2019-10-11 DIAGNOSIS — I10 Essential (primary) hypertension: Secondary | ICD-10-CM | POA: Diagnosis not present

## 2019-10-11 DIAGNOSIS — E119 Type 2 diabetes mellitus without complications: Secondary | ICD-10-CM | POA: Diagnosis not present

## 2019-10-11 DIAGNOSIS — I4891 Unspecified atrial fibrillation: Secondary | ICD-10-CM | POA: Diagnosis not present

## 2019-10-11 DIAGNOSIS — Z79899 Other long term (current) drug therapy: Secondary | ICD-10-CM | POA: Insufficient documentation

## 2019-10-11 DIAGNOSIS — R300 Dysuria: Secondary | ICD-10-CM | POA: Diagnosis present

## 2019-10-11 DIAGNOSIS — Z7901 Long term (current) use of anticoagulants: Secondary | ICD-10-CM | POA: Insufficient documentation

## 2019-10-11 LAB — URINALYSIS, COMPLETE (UACMP) WITH MICROSCOPIC
Bilirubin Urine: NEGATIVE
Glucose, UA: NEGATIVE mg/dL
Ketones, ur: NEGATIVE mg/dL
Leukocytes,Ua: NEGATIVE
Nitrite: POSITIVE — AB
Protein, ur: 100 mg/dL — AB
Specific Gravity, Urine: 1.018 (ref 1.005–1.030)
WBC, UA: >50 WBC/hpf — ABNORMAL HIGH (ref 0–5)
pH: 8 (ref 5.0–8.0)

## 2019-10-11 MED ORDER — LIDOCAINE HCL (PF) 1 % IJ SOLN
INTRAMUSCULAR | Status: AC
  Filled 2019-10-11: qty 5

## 2019-10-11 MED ORDER — CEPHALEXIN 500 MG PO CAPS: 500.0000 mg | ORAL_CAPSULE | Freq: Two times a day (BID) | ORAL | 0 refills | Status: AC

## 2019-10-11 MED ORDER — CEFTRIAXONE SODIUM 1 G IJ SOLR
500.0000 mg | Freq: Once | INTRAMUSCULAR | Status: AC
  Administered 2019-10-11: 500 mg via INTRAMUSCULAR
  Filled 2019-10-11: qty 10

## 2019-10-11 NOTE — ED Provider Notes (Signed)
[facility] Emergency Department Provider Note ____________________________________________   None    (approximate)  I have reviewed the triage vital signs and the nursing notes.   HISTORY  Chief Complaint Dysuria  HPI Yolanda Cantu is a 86 y.o. female with history as listed below presents to the emergency department for treatment and evaluation of dysuria.  She has an indwelling catheter and states that she has had severe burning with urination.  She denies fever.  She states that she is also had some leaking around her catheter.  She is scheduled to have the catheter changed on Wednesday.  She states that the dysuria was to the point where she could not stand to wait that long and decided to come to the emergency department today.  She has attempted taking some over-the-counter medications for bladder spasms which have not helped..         Past Medical History:  Diagnosis Date  . Arthritis    knees  . Atrial fibrillation (HCC)   . Diabetes mellitus without complication (HCC)   . Hypercholesteremia   . Hypertension   . Hypothyroidism   . Thyroid disease   . Wears dentures    partial lower    Patient Active Problem List   Diagnosis Date Noted  . DNR (do not resuscitate) 08/06/2017  . History of glaucoma 08/06/2017  . Chronic indwelling Foley catheter 01/24/2017  . Urinary retention 12/18/2016  . Encounter for general adult medical examination without abnormal findings 01/16/2016  . Vaccine counseling 01/16/2016  . Closed right hip fracture (HCC) 09/23/2015  . Diabetes (HCC) 09/23/2015  . HTN (hypertension) 09/23/2015  . Hypothyroidism 09/23/2015  . Atrial fibrillation (HCC) 09/23/2015  . Pure hypercholesterolemia 10/18/2014  . B12 deficiency 08/24/2013  . Cobalamin deficiency 08/24/2013    Past Surgical History:  Procedure Laterality Date  . ABDOMINAL HYSTERECTOMY    . APPENDECTOMY    . CATARACT EXTRACTION W/PHACO Left 05/09/2015    Procedure: CATARACT EXTRACTION PHACO AND INTRAOCULAR LENS PLACEMENT (IOC) left eye;  Surgeon: Sherald Hess, MD;  Location: [facility];  Service: Ophthalmology;  Laterality: Left;  DIABETIC - oral meds per pt 8 or after for arrival time  . CHOLECYSTECTOMY    . INTRAMEDULLARY (IM) NAIL INTERTROCHANTERIC Right 09/25/2015   Procedure: INTRAMEDULLARY (IM) NAIL INTERTROCHANTRIC;  Surgeon: Juanell Fairly, MD;  Location: [facility];  Service: Orthopedics;  Laterality: Right;  . SPLENECTOMY    . SPLENECTOMY    . TONSILLECTOMY    . TOTAL KNEE ARTHROPLASTY Left     Prior to Admission medications   Medication Sig Start Date End Date Taking? Authorizing Provider  acetaminophen (TYLENOL) 500 MG tablet Take 500 mg by mouth every 6 (six) hours as needed.    [provider]  atorvastatin (LIPITOR) 40 MG tablet Take 20 mg by mouth daily.    [provider]  cephALEXin (KEFLEX) 500 MG capsule Take 1 capsule (500 mg total) by mouth 2 (two) times daily for 7 days. 10/11/19 10/18/19  Axie Hayne, Rulon Eisenmenger B, FNP  COMBIGAN 0.2-0.5 % ophthalmic solution INSTILL 1 DROP INTO BOTH EYES TWICE A DAY 03/23/18   [provider]  diltiazem (CARDIZEM CD) 240 MG 24 hr capsule TAKE 1 CAPSULE BY MOUTH ONCE A DAY 01/16/16   [provider]  diltiazem (TIAZAC) 240 MG 24 hr capsule TAKE 1 CAPSULE BY MOUTH EVERY DAY 03/27/19   [provider]  latanoprost (XALATAN) 0.005 % ophthalmic solution INSTILL ONE DROP IN BOTH  EYES AT BEDTIME. 03/23/18   [provider]  levothyroxine (SYNTHROID, LEVOTHROID) 125 MCG tablet Take 125 mcg by mouth daily before breakfast.    [provider]  lisinopril (ZESTRIL) 20 MG tablet Take 20 mg by mouth daily. 03/24/19   [provider]  metFORMIN (GLUCOPHAGE) 500 MG tablet Take 250 mg by mouth 2 (two) times daily with a meal.    [provider]  mirabegron ER (MYRBETRIQ) 50 MG TB24 tablet Take 1 tablet (50 mg total) by  mouth daily. 05/28/19   Michiel Cowboy A, PA-C  oxybutynin (DITROPAN) 5 MG tablet Take 1 tablet (5 mg total) by mouth every 8 (eight) hours as needed for bladder spasms. 07/03/19   Michiel Cowboy A, PA-C  warfarin (COUMADIN) 2 MG tablet Take 2 mg by mouth daily.    [provider]    Allergies Patient has no known allergies.  Family History  Problem Relation Age of Onset  . Diabetes Father   . Stroke Father   . Lung cancer Brother   . Heart disease Mother   . Lung cancer Sister   . Prostate cancer Neg Hx   . Kidney cancer Neg Hx   . Bladder Cancer Neg Hx     Social History Social History   Tobacco Use  . Smoking status: Never Smoker  . Smokeless tobacco: Never Used  Substance Use Topics  . Alcohol use: No  . Drug use: No    Review of Systems  Constitutional: No fever/chills Eyes: No visual changes. ENT: No sore throat. Cardiovascular: Denies chest pain. Respiratory: Denies shortness of breath. Gastrointestinal: No abdominal pain.  No nausea, no vomiting.  No diarrhea.  No constipation. Genitourinary: Positive for dysuria. Musculoskeletal: Negative for back pain. Skin: Negative for rash. Neurological: Negative for headaches, focal weakness or numbness. ____________________________________________   PHYSICAL EXAM:  VITAL SIGNS: ED Triage Vitals  Enc Vitals Group     BP 10/11/19 1302 130/68     Pulse Rate 10/11/19 1302 71     Resp 10/11/19 1302 18     Temp 10/11/19 1302 98.7 F (37.1 C)     Temp Source 10/11/19 1302 Oral     SpO2 10/11/19 1302 98 %     Weight 10/11/19 1303 135 lb (61.2 kg)     Height 10/11/19 1303 5\' 5"  (1.651 m)     Head Circumference --      Peak Flow --      Pain Score 10/11/19 1303 8     Pain Loc --      Pain Edu? --      Excl. in GC? --     Constitutional: Alert and oriented. Well appearing and in no acute distress. Eyes: Conjunctivae are normal. Head: Atraumatic. Nose: No congestion/rhinnorhea. Neck: No stridor.     Cardiovascular: Normal rate, regular rhythm. Grossly normal heart sounds.  Good peripheral circulation. Respiratory: Normal respiratory effort.  No retractions. Lungs CTAB. Gastrointestinal: Soft and nontender. No distention. No abdominal bruits. Genitourinary:  Musculoskeletal: No lower extremity tenderness nor edema.  No joint effusions. Neurologic:  Normal speech and language. No gross focal neurologic deficits are appreciated. No gait instability. Skin:  Skin is warm, dry and intact. No rash noted. Psychiatric: Mood and affect are normal. Speech and behavior are normal.  ____________________________________________   LABS (all labs ordered are listed, but only abnormal results are displayed)  Labs Reviewed  URINALYSIS, COMPLETE (UACMP) WITH MICROSCOPIC - Abnormal; Notable for the following components:  Result Value   Color, Urine AMBER (*)    APPearance CLOUDY (*)    Hgb urine dipstick SMALL (*)    Protein, ur 100 (*)    Nitrite POSITIVE (*)    WBC, UA >50 (*)    Bacteria, UA FEW (*)    All other components within normal limits   ____________________________________________  EKG  Not indicated ____________________________________________  RADIOLOGY  ED MD interpretation:    Not indicated I, Kem Boroughs, personally viewed and evaluated these images (plain radiographs) as part of my medical decision making, as well as reviewing the written report by the radiologist.  Official radiology report(s): No results found.  ____________________________________________   PROCEDURES  Procedure(s) performed (including Critical Care):  Procedures  ____________________________________________   INITIAL IMPRESSION / ASSESSMENT AND PLAN     86 year old female presenting to the emergency department for evaluation of dysuria.  See HPI for further details.  Plan will be to treat her nitrate positive urinary tract infection.  She will receive an injection of Rocephin  while here then be prescribed Keflex.  Foley will also be changed out today as well.    She was encouraged to follow-up with primary care in a week or so for a repeat urinalysis.  She was encouraged to return to the emergency department for symptoms that change or worsen or for new concerns if she is unable to see primary care.   ___________________________________________   FINAL CLINICAL IMPRESSION(S) / ED DIAGNOSES  Final diagnoses:  Acute cystitis with hematuria     ED Discharge Orders         Ordered    cephALEXin (KEFLEX) 500 MG capsule  2 times daily        10/11/19 1621           Lesly Dukes Holck was evaluated in Emergency Department on 10/11/2019 for the symptoms described in the history of present illness. She was evaluated in the context of the global COVID-19 pandemic, which necessitated consideration that the patient might be at risk for infection with the SARS-CoV-2 virus that causes COVID-19. Institutional protocols and algorithms that pertain to the evaluation of patients at risk for COVID-19 are in a state of rapid change based on information released by regulatory bodies including the CDC and federal and state organizations. These policies and algorithms were followed during the patient's care in the ED.   Note:  This document was prepared using Dragon voice recognition software and may include unintentional dictation errors.   Chinita Pester, FNP 10/11/19 2047    Gilles Chiquito, MD 10/11/19 2153

## 2019-10-11 NOTE — Discharge Instructions (Signed)
Please follow-up with your primary care provider in a week to 10 days for a repeat urinalysis.  Return to the emergency department for symptoms that change or worsen if you are unable to schedule an appointment.

## 2019-10-11 NOTE — ED Triage Notes (Signed)
Pt to the er for a possible bladder infection. Pt has an indwelling catheter and is due to be changed on Wednesday (every 4 weeks). Pt has medications for bladder spasms which are not helping. Pt has an odor of urine present. Pt states that urine is leaking around the catheter as well. Pt reports she feels like "she is on fire."

## 2019-10-14 ENCOUNTER — Ambulatory Visit: Payer: Self-pay | Admitting: Urology

## 2019-11-11 NOTE — Progress Notes (Signed)
Cath Change/ Replacement  Patient is present today for a catheter change due to urinary retention.  8 ml of water was removed from the balloon, a 16 FR foley cath was removed with out difficulty.  Patient was cleaned and prepped in a sterile fashion with betadine. A 16 FR foley cath was replaced into the bladder no complications were noted Urine return was noted 30 ml and urine was yellow in color. The balloon was filled with 9ml of sterile water. A leg bag was attached for drainage.  A night bag was also given to the patient and patient was given instruction on how to change from one bag to another. Patient was given proper instruction on catheter care.    Performed by: Michiel Cowboy, PA-C  Follow up: One month for Foley catheter exchange

## 2019-11-12 ENCOUNTER — Other Ambulatory Visit: Payer: Self-pay

## 2019-11-12 ENCOUNTER — Ambulatory Visit (INDEPENDENT_AMBULATORY_CARE_PROVIDER_SITE_OTHER): Payer: Medicare Other | Admitting: Urology

## 2019-11-12 DIAGNOSIS — Z978 Presence of other specified devices: Secondary | ICD-10-CM

## 2019-12-10 NOTE — Progress Notes (Signed)
Cath Change/ Replacement Patient is present today for a catheter change due to urinary retention.  9 ml of water was removed from the balloon, a 16 FR foley cath was removed with out difficulty.  Patient was cleaned and prepped in a sterile fashion with betadine. A 16 FR foley cath was replaced into the bladder no complications were noted   Urine return was noted 5 ml and urine was yellow in color. The balloon was filled with 15ml of sterile water. A leg bag was attached for drainage.  Patient was given proper instruction on catheter care.    Performed by: Michiel Cowboy, PA-C and Gerarda Gunther, CMA  Follow up: One month for catheter exchange

## 2019-12-11 ENCOUNTER — Ambulatory Visit (INDEPENDENT_AMBULATORY_CARE_PROVIDER_SITE_OTHER): Payer: Medicare Other | Admitting: Urology

## 2019-12-11 ENCOUNTER — Other Ambulatory Visit: Payer: Self-pay

## 2019-12-11 ENCOUNTER — Encounter: Payer: Self-pay | Admitting: Urology

## 2019-12-11 VITALS — BP 130/80 | HR 74 | Ht 60.0 in | Wt 135.0 lb

## 2019-12-11 DIAGNOSIS — Z978 Presence of other specified devices: Secondary | ICD-10-CM

## 2020-01-08 ENCOUNTER — Ambulatory Visit (INDEPENDENT_AMBULATORY_CARE_PROVIDER_SITE_OTHER): Payer: Medicare Other | Admitting: Urology

## 2020-01-08 ENCOUNTER — Other Ambulatory Visit: Payer: Self-pay

## 2020-01-08 DIAGNOSIS — Z978 Presence of other specified devices: Secondary | ICD-10-CM | POA: Diagnosis not present

## 2020-01-08 NOTE — Progress Notes (Signed)
Cath Change/ Replacement  Patient is present today for a catheter change due to urinary retention.  9 ml of water was removed from the balloon, a 16 FR foley cath was removed with out difficulty.  Patient was cleaned and prepped in a sterile fashion with betadine. A 16 FR foley cath was replaced into the bladder no complications were noted Urine return was noted 5 ml and urine was yellow clear in color. The balloon was filled with 37ml of sterile water. A leg bag was attached for drainage.  A night bag was also given to the patient and patient was given instruction on how to change from one bag to another. Patient was given proper instruction on catheter care.    Performed by: Michiel Cowboy, PA-C  Follow up: Follow up in one month for cath exchange

## 2020-02-08 ENCOUNTER — Ambulatory Visit (INDEPENDENT_AMBULATORY_CARE_PROVIDER_SITE_OTHER): Payer: Medicare Other | Admitting: Physician Assistant

## 2020-02-08 ENCOUNTER — Other Ambulatory Visit: Payer: Self-pay

## 2020-02-08 VITALS — BP 163/74 | HR 58

## 2020-02-08 DIAGNOSIS — Z978 Presence of other specified devices: Secondary | ICD-10-CM

## 2020-02-08 NOTE — Progress Notes (Signed)
Cath Change/ Replacement  Patient is present today for a catheter change due to urinary retention.  34ml of water was removed from the balloon, a 16FR foley cath was removed without difficulty.  Patient was cleaned and prepped in a sterile fashion with betadine. A 16 FR foley cath was replaced into the bladder no complications were noted Urine return was noted <4ml and urine was yellow in color; appropriate catheter placement was confirmed with finger insertion into the vagina. The balloon was filled with 42ml of sterile water. A leg bag was attached for drainage.  Patient declined additional supplies.  Performed by: Carman Ching, PA-C   Additional notes: Patient reports a 1 week history of BLE edema, shortness of breath, and decreased urinary output today that she attributes to her Foley catheter. She reports Dr. Burnadette Pop has previously prescribed her "water pills" for management of fluid overload to use as needed, however she has run out of these. On physical exam, patient is tachypneic (20 RPM) with 3+ edema of the BLEs but not ill or toxic appearing and with no respiratory distress. Vitals as below. Counseled patient that her symptoms are unrelated to her Foley catheter and she needs to be seen by Dr. Burnadette Pop ASAP, however she is due for her monthly Foley exchange this week so I proceeded with exchange in clinic today as above. Franchot Erichsen, CMA contacted Dr. Sherryll Burger office to arrange follow up in his clinic this afternoon. Counseled patient to proceed to the ED in the interim with worsening dyspnea; she expressed understanding.  Vitals:   02/08/20 1128  BP: (!) 163/74  Pulse: (!) 58  SpO2: (!) 88%   Follow up: Return in about 4 weeks (around 03/07/2020) for Catheter exchange.

## 2020-02-09 ENCOUNTER — Ambulatory Visit: Payer: Self-pay | Admitting: Urology

## 2020-02-11 ENCOUNTER — Ambulatory Visit: Payer: Self-pay | Admitting: Urology

## 2020-02-12 ENCOUNTER — Ambulatory Visit: Payer: Self-pay | Admitting: Urology

## 2020-02-29 ENCOUNTER — Ambulatory Visit (INDEPENDENT_AMBULATORY_CARE_PROVIDER_SITE_OTHER)
Admission: EM | Admit: 2020-02-29 | Discharge: 2020-02-29 | Disposition: A | Discharge status: Other Acute Inpatient Hospital | Payer: Medicare Other | Source: Home / Self Care

## 2020-02-29 ENCOUNTER — Emergency Department
Admission: EM | Admit: 2020-02-29 | Discharge: 2020-02-29 | Disposition: A | Discharge status: Home / Self Care | Payer: Medicare Other | Attending: Physician Assistant | Admitting: Physician Assistant

## 2020-02-29 ENCOUNTER — Other Ambulatory Visit: Payer: Self-pay

## 2020-02-29 ENCOUNTER — Encounter: Payer: Self-pay | Admitting: Emergency Medicine

## 2020-02-29 ENCOUNTER — Ambulatory Visit (INDEPENDENT_AMBULATORY_CARE_PROVIDER_SITE_OTHER): Payer: Medicare Other

## 2020-02-29 DIAGNOSIS — J90 Pleural effusion, not elsewhere classified: Secondary | ICD-10-CM | POA: Diagnosis not present

## 2020-02-29 DIAGNOSIS — R0602 Shortness of breath: Secondary | ICD-10-CM

## 2020-02-29 DIAGNOSIS — E860 Dehydration: Secondary | ICD-10-CM | POA: Insufficient documentation

## 2020-02-29 DIAGNOSIS — Z5321 Procedure and treatment not carried out due to patient leaving prior to being seen by health care provider: Secondary | ICD-10-CM | POA: Insufficient documentation

## 2020-02-29 DIAGNOSIS — R609 Edema, unspecified: Secondary | ICD-10-CM | POA: Insufficient documentation

## 2020-02-29 DIAGNOSIS — N3001 Acute cystitis with hematuria: Secondary | ICD-10-CM | POA: Insufficient documentation

## 2020-02-29 DIAGNOSIS — R809 Proteinuria, unspecified: Secondary | ICD-10-CM | POA: Insufficient documentation

## 2020-02-29 DIAGNOSIS — M7989 Other specified soft tissue disorders: Secondary | ICD-10-CM | POA: Diagnosis not present

## 2020-02-29 LAB — URINALYSIS, COMPLETE (UACMP) WITH MICROSCOPIC
Bilirubin Urine: NEGATIVE
Glucose, UA: NEGATIVE mg/dL
Nitrite: POSITIVE — AB
Protein, ur: >300 mg/dL — AB
Specific Gravity, Urine: >1.03 — ABNORMAL HIGH (ref 1.005–1.030)
pH: 7 (ref 5.0–8.0)

## 2020-02-29 NOTE — ED Provider Notes (Signed)
[facility]    CSN: [id number] Arrival date & time: 02/29/20  1416      History   Chief Complaint Chief Complaint  Patient presents with  . Shortness of Breath  . Leg Swelling    HPI Yolanda Cantu is a 87 y.o. female presenting with daughter for increased bilateral leg swelling and SOB over the past 3 weeks.  She also says she has had decreased urine output for several days.  She says she only has had about a cup of urine put out each day.  She has a Foley catheter so she is able to check this.  She follows up with a urologist regarding this.  Patient apparently saw her PCP on 02/08/2020 regarding these symptoms and declined any work-up.  She was given Lasix 20 mg to take daily.  She says that she takes it every couple of days.  Has not taken it in a few days. Patient has history of A-fib and has a cardiologist, Dr. Lady Gary who she sees regularly. She takes warfarin, cardizem, and lisinopril. She also has T2DM and takes metformin. Also takes lipitor for hyperlipidemia.   Presently, patient denies fever, fatigue, chest pain, palpitations, dizziness, abdominal pain, vomiting or diarrhea.  No exposure to COVID-19.  Patient denies any other urinary symptoms.  No dysuria, urinary frequency or urgency.  Has not noticed any blood in the urine.  No back or flank pain.  No other complaints or concerns.  HPI  Past Medical History:  Diagnosis Date  . Arthritis    knees  . Atrial fibrillation (HCC)   . Diabetes mellitus without complication (HCC)   . Hypercholesteremia   . Hypertension   . Hypothyroidism   . Thyroid disease   . Wears dentures    partial lower    Patient Active Problem List   Diagnosis Date Noted  . DNR (do not resuscitate) 08/06/2017  . History of glaucoma 08/06/2017  . Chronic indwelling Foley catheter 01/24/2017  . Urinary retention 12/18/2016  . Encounter for general adult medical examination without abnormal findings 01/16/2016  . Vaccine counseling  01/16/2016  . Closed right hip fracture (HCC) 09/23/2015  . Diabetes (HCC) 09/23/2015  . HTN (hypertension) 09/23/2015  . Hypothyroidism 09/23/2015  . Atrial fibrillation (HCC) 09/23/2015  . Pure hypercholesterolemia 10/18/2014  . B12 deficiency 08/24/2013  . Cobalamin deficiency 08/24/2013    Past Surgical History:  Procedure Laterality Date  . ABDOMINAL HYSTERECTOMY    . APPENDECTOMY    . CATARACT EXTRACTION W/PHACO Left 05/09/2015   Procedure: CATARACT EXTRACTION PHACO AND INTRAOCULAR LENS PLACEMENT (IOC) left eye;  Surgeon: Sherald Hess, MD;  Location: [facility];  Service: Ophthalmology;  Laterality: Left;  DIABETIC - oral meds per pt 8 or after for arrival time  . CHOLECYSTECTOMY    . INTRAMEDULLARY (IM) NAIL INTERTROCHANTERIC Right 09/25/2015   Procedure: INTRAMEDULLARY (IM) NAIL INTERTROCHANTRIC;  Surgeon: Juanell Fairly, MD;  Location: [facility];  Service: Orthopedics;  Laterality: Right;  . SPLENECTOMY    . SPLENECTOMY    . TONSILLECTOMY    . TOTAL KNEE ARTHROPLASTY Left     OB History   No obstetric history on file.      Home Medications    Prior to Admission medications   Medication Sig Start Date End Date Taking? Authorizing Provider  atorvastatin (LIPITOR) 40 MG tablet Take 20 mg by mouth daily.   Yes [provider]  COMBIGAN 0.2-0.5 % ophthalmic solution INSTILL 1 DROP INTO BOTH  EYES TWICE A DAY 03/23/18  Yes [provider]  diltiazem (TIAZAC) 240 MG 24 hr capsule TAKE 1 CAPSULE BY MOUTH EVERY DAY 03/27/19  Yes [provider]  latanoprost (XALATAN) 0.005 % ophthalmic solution INSTILL ONE DROP IN BOTH EYES AT BEDTIME. 03/23/18  Yes [provider]  metFORMIN (GLUCOPHAGE) 500 MG tablet Take 250 mg by mouth 2 (two) times daily with a meal.   Yes [provider]  SYNTHROID 112 MCG tablet Take 112 mcg by mouth daily. 01/05/20  Yes [provider]  warfarin (COUMADIN) 2 MG tablet Take 2 mg by  mouth daily.   Yes [provider]  acetaminophen (TYLENOL) 500 MG tablet Take 500 mg by mouth every 6 (six) hours as needed.    [provider]  diltiazem (CARDIZEM CD) 240 MG 24 hr capsule TAKE 1 CAPSULE BY MOUTH ONCE A DAY 01/16/16   [provider]  lisinopril (ZESTRIL) 20 MG tablet Take 20 mg by mouth daily. 03/24/19   [provider]  mirabegron ER (MYRBETRIQ) 50 MG TB24 tablet Take 1 tablet (50 mg total) by mouth daily. 05/28/19   Michiel CowboyMcGowan, Shannon A, PA-C  oxybutynin (DITROPAN) 5 MG tablet Take 1 tablet (5 mg total) by mouth every 8 (eight) hours as needed for bladder spasms. 07/03/19   Harle BattiestMcGowan, Shannon A, PA-C    Family History Family History  Problem Relation Age of Onset  . Diabetes Father   . Stroke Father   . Lung cancer Brother   . Heart disease Mother   . Lung cancer Sister   . Prostate cancer Neg Hx   . Kidney cancer Neg Hx   . Bladder Cancer Neg Hx     Social History Social History   Tobacco Use  . Smoking status: Never Smoker  . Smokeless tobacco: Never Used  Substance Use Topics  . Alcohol use: No  . Drug use: No     Allergies   Patient has no known allergies.   Review of Systems Review of Systems  Constitutional: Negative for chills, diaphoresis, fatigue and fever.  HENT: Negative for congestion, ear pain, rhinorrhea and sore throat.   Respiratory: Positive for shortness of breath. Negative for cough.   Cardiovascular: Positive for leg swelling. Negative for chest pain and palpitations.  Gastrointestinal: Negative for abdominal pain, nausea and vomiting.  Genitourinary: Positive for decreased urine volume.  Musculoskeletal: Negative for arthralgias and myalgias.  Skin: Negative for rash.  Neurological: Negative for weakness and headaches.  Hematological: Negative for adenopathy.     Physical Exam Triage Vital Signs ED Triage Vitals  Enc Vitals Group     BP 02/29/20 1436 132/87     Pulse Rate 02/29/20 1436 62      Resp 02/29/20 1436 18     Temp 02/29/20 1436 (!) 97.5 F (36.4 C)     Temp Source 02/29/20 1436 Oral     SpO2 02/29/20 1436 93 %     Weight 02/29/20 1432 134 lb 14.7 oz (61.2 kg)     Height 02/29/20 1432 5' (1.524 m)     Head Circumference --      Peak Flow --      Pain Score 02/29/20 1432 0     Pain Loc --      Pain Edu? --      Excl. in GC? --    No data found.  Updated Vital Signs BP 132/87 (BP Location: Left Arm)   Pulse 62   Temp Marland Kitchen(!)  97.5 F (36.4 C) (Oral)   Resp 18   Ht 5' (1.524 m)   Wt 149 lb 4.8 oz (67.7 kg)   SpO2 93%   BMI 29.16 kg/m    Physical Exam Vitals and nursing note reviewed.  Constitutional:      General: She is not in acute distress.    Appearance: Normal appearance. She is not ill-appearing or toxic-appearing.  HENT:     Head: Normocephalic and atraumatic.     Nose: Nose normal.     Mouth/Throat:     Mouth: Mucous membranes are moist.     Pharynx: Oropharynx is clear.  Eyes:     General: No scleral icterus.       Right eye: No discharge.        Left eye: No discharge.     Conjunctiva/sclera: Conjunctivae normal.  Cardiovascular:     Rate and Rhythm: Normal rate and regular rhythm.     Heart sounds: Normal heart sounds.  Pulmonary:     Effort: Pulmonary effort is normal. No respiratory distress.     Breath sounds: Normal breath sounds. No wheezing, rhonchi or rales.  Musculoskeletal:     Cervical back: Neck supple.     Right lower leg: Edema (2+ pitting edema to proximal shin bilaterally) present.     Left lower leg: Edema present.  Skin:    General: Skin is dry.  Neurological:     General: No focal deficit present.     Mental Status: She is alert. Mental status is at baseline.     Motor: No weakness.     Gait: Gait normal.  Psychiatric:        Mood and Affect: Mood normal.        Behavior: Behavior normal.        Thought Content: Thought content normal.      UC Treatments / Results  Labs (all labs ordered are listed,  but only abnormal results are displayed) Labs Reviewed  URINALYSIS, COMPLETE (UACMP) WITH MICROSCOPIC - Abnormal; Notable for the following components:      Result Value   APPearance CLOUDY (*)    Specific Gravity, Urine >1.030 (*)    Hgb urine dipstick LARGE (*)    Ketones, ur TRACE (*)    Protein, ur >300 (*)    Nitrite POSITIVE (*)    Leukocytes,Ua SMALL (*)    Bacteria, UA MANY (*)    All other components within normal limits    EKG   Radiology DG Chest 2 View  Result Date: 02/29/2020 CLINICAL DATA:  Shortness of breath EXAM: CHEST - 2 VIEW COMPARISON:  09/23/2015 FINDINGS: Cardiomegaly. Atherosclerotic calcification of the aortic knob. Hyperexpanded/hyperlucent lungs with coarsened interstitial markings bilaterally. Moderate right pleural effusion. No pneumothorax. Exaggerated thoracic kyphosis. Chronic appearing posttraumatic deformity of the left humeral neck. IMPRESSION: 1. Moderate right pleural effusion. 2. Cardiomegaly. 3. Emphysema. Electronically Signed   By: Duanne Guess D.O.   On: 02/29/2020 15:27    Procedures ED EKG  Date/Time: 02/29/2020 3:20 PM Performed by: Shirlee Latch, PA-C Authorized by: Shirlee Latch, PA-C   Previous ECG:    Previous ECG:  Unavailable Interpretation:    Interpretation: abnormal   Rate:    ECG rate:  61   ECG rate assessment: normal   Rhythm:    Rhythm: sinus rhythm and A-V block     A-V block: 1st Degree   Ectopy:    Ectopy: PAC   QRS:  QRS axis:  Normal   QRS intervals:  Normal ST segments:    ST segments:  Normal T waves:    T waves: normal   Comments:     Sinus rhythm with occasional PACs, 1st degree AV block   (including critical care time)  Medications Ordered in UC Medications - No data to display  Initial Impression / Assessment and Plan / UC Course  I have reviewed the triage vital signs and the nursing notes.  Pertinent labs & imaging results that were available during my care of the patient were  reviewed by me and considered in my medical decision making (see chart for details).   87 year old female presenting with daughter for leg swelling, shortness of breath and decreased urine output worsening over 2 weeks.  She does have history of A. fib but no diagnosis of CHF.  She has an indwelling Foley catheter.  Vital signs are normal and stable and she is in no acute distress.  Significant exam findings today are 2+ pitting edema to proximal bilateral lower legs.  EKG today shows sinus rhythm with PACs and first-degree AV block.  No ST or T wave changes.  Chest x-ray performed today notes moderate right-sided pleural effusion and cardiomegaly. Independently reviewed imaging.  Urinalysis shows greater than 1.030 specific gravity, large blood, trace ketones, greater than 300 protein, nitrites, and small leukocytes.  I reviewed patient's last clinic visit on 02/08/2020 with internal medicine.  She has had a 4 lb weight gain in the last 20 days.  Her oxygen at the time of the visit was 90% and it is 93% currently.  No mention of hypoxia.  Patient does not use oxygen.  Based on patient's urinalysis, EKG and chest x-ray is suspect dehydration, acute kidney injury, peripheral edema, possible CHF, and urinary tract infection.  Advised patient that she needs to follow-up in the emergency department this time because she needs fluid replacement but also to have some fluid taken off her legs.  Also advised she needs treatment for her UTI and there is concern for acute kidney injury which likely needs monitoring release fluids.  Patient is initially resistant but after explaining the risks of not going to ED, she does agree to go to [facility] at ED at this time.  Her daughter is taking her in stable condition.  Final Clinical Impressions(s) / UC Diagnoses   Final diagnoses:  Peripheral edema  Dehydration  Acute cystitis with hematuria  Pleural effusion  Proteinuria, unspecified  type     Discharge Instructions     You are severely dehydrated.  At the same time you have a significant urinary tract infection and a lot of protein in your urine.  There are signs that your kidneys are injured.  You also have some fluid affecting your right lung.  Additionally, you have significant swelling/fluid all the way up to your knees bilaterally.  I would advise that you go to the emergency department at this time for fluid replacement, treatment for your UTI and possible diuretics.  If you do not go then you can have a worsening infection, your kidneys can shut down, and this could be detrimental to your survival.  You have been advised to follow up immediately in the emergency department for concerning signs.symptoms. If you declined EMS transport, please have a family member take you directly to the ED at this time. Do not delay. Based on concerns about condition, if you do not follow up in th  e ED, you may risk poor outcomes including worsening of condition, delayed treatment and potentially life threatening issues. If you have declined to go to the ED at this time, you should call your PCP immediately to set up a follow up appointment.  Go to ED for red flag symptoms, including; fevers you cannot reduce with Tylenol/Motrin, severe headaches, vision changes, numbness/weakness in part of the body, lethargy, confusion, intractable vomiting, severe dehydration, chest pain, breathing difficulty, severe persistent abdominal or pelvic pain, signs of severe infection (increased redness, swelling of an area), feeling faint or passing out, dizziness, etc. You should especially go to the ED for sudden acute worsening of condition if you do not elect to go at this time.     ED Prescriptions    None     PDMP not reviewed this encounter.   Shirlee Latch, PA-C 02/29/20 1635

## 2020-02-29 NOTE — Discharge Instructions (Signed)
You are severely dehydrated.  At the same time you have a significant urinary tract infection and a lot of protein in your urine.  There are signs that your kidneys are injured.  You also have some fluid affecting your right lung.  Additionally, you have significant swelling/fluid all the way up to your knees bilaterally.  I would advise that you go to the emergency department at this time for fluid replacement, treatment for your UTI and possible diuretics.  If you do not go then you can have a worsening infection, your kidneys can shut down, and this could be detrimental to your survival.  You have been advised to follow up immediately in the emergency department for concerning signs.symptoms. If you declined EMS transport, please have a family member take you directly to the ED at this time. Do not delay. Based on concerns about condition, if you do not follow up in th e ED, you may risk poor outcomes including worsening of condition, delayed treatment and potentially life threatening issues. If you have declined to go to the ED at this time, you should call your PCP immediately to set up a follow up appointment.  Go to ED for red flag symptoms, including; fevers you cannot reduce with Tylenol/Motrin, severe headaches, vision changes, numbness/weakness in part of the body, lethargy, confusion, intractable vomiting, severe dehydration, chest pain, breathing difficulty, severe persistent abdominal or pelvic pain, signs of severe infection (increased redness, swelling of an area), feeling faint or passing out, dizziness, etc. You should especially go to the ED for sudden acute worsening of condition if you do not elect to go at this time.

## 2020-02-29 NOTE — ED Triage Notes (Signed)
Patient is being discharged from the Urgent Care and sent to the Emergency Department via POV . Per Athena Masse, PA, patient is in need of higher level of care due to symptoms. Patient is aware and verbalizes understanding of plan of care.  Vitals:   02/29/20 1436  BP: 132/87  Pulse: 62  Resp: 18  Temp: (!) 97.5 F (36.4 C)  SpO2: 93%

## 2020-02-29 NOTE — ED Triage Notes (Signed)
Pt c/o bilateral leg swelling and shortness of breath. Started about 2 weeks ago. Denies chest pain.

## 2020-03-08 ENCOUNTER — Ambulatory Visit: Payer: Self-pay | Admitting: Urology

## 2020-03-08 NOTE — Progress Notes (Signed)
Cath Change/ Replacement  Patient is present today for a catheter change due to urinary retention.  9 ml of water was removed from the balloon, a 16 FR foley cath was removed with out difficulty.  Patient was cleaned and prepped in a sterile fashion with betadine. A 16 FR foley cath was replaced into the bladder no complications were noted Urine return was noted 10 ml and urine was yellow clear in color. The balloon was filled with 35ml of sterile water. A leg bag was attached for drainage.  Patient was given proper instruction on catheter care.    Performed by: Michiel Cowboy, PA-C  Follow up: One month for Foley catheter exchange

## 2020-03-09 ENCOUNTER — Ambulatory Visit (INDEPENDENT_AMBULATORY_CARE_PROVIDER_SITE_OTHER): Payer: Medicare Other | Admitting: Urology

## 2020-03-09 ENCOUNTER — Other Ambulatory Visit: Payer: Self-pay

## 2020-03-09 DIAGNOSIS — Z978 Presence of other specified devices: Secondary | ICD-10-CM | POA: Diagnosis not present

## 2020-04-05 NOTE — Progress Notes (Signed)
Error

## 2020-04-06 ENCOUNTER — Other Ambulatory Visit: Payer: Self-pay

## 2020-04-06 ENCOUNTER — Ambulatory Visit (INDEPENDENT_AMBULATORY_CARE_PROVIDER_SITE_OTHER): Payer: Medicare Other | Admitting: Urology

## 2020-04-06 DIAGNOSIS — Z978 Presence of other specified devices: Secondary | ICD-10-CM | POA: Diagnosis not present

## 2020-04-06 NOTE — Progress Notes (Signed)
Cath Change/ Replacement  Patient is present today for a catheter change due to urinary retention.  40ml of water was removed from the balloon, a 16FR foley cath was removed with out difficulty.  Patient was cleaned and prepped in a sterile fashion with betadine. A 16 FR foley cath was replaced into the bladder no complications were noted Urine return was noted 59ml and urine was yellow in color. The balloon was filled with 8ml of sterile water. A leg bag was attached for drainage.  A night bag was also given to the patient and patient was given instruction on how to change from one bag to another. Patient was given proper instruction on catheter care.    Performed by: Eligha Bridegroom, CMA  Follow up: 1 month

## 2020-04-27 ENCOUNTER — Emergency Department: Payer: No Typology Code available for payment source

## 2020-04-27 ENCOUNTER — Encounter: Payer: Self-pay | Admitting: Radiology

## 2020-04-27 ENCOUNTER — Emergency Department
Admission: EM | Admit: 2020-04-27 | Discharge: 2020-04-27 | Disposition: A | Discharge status: Other Acute Inpatient Hospital | Payer: No Typology Code available for payment source | Attending: Emergency Medicine | Admitting: Emergency Medicine

## 2020-04-27 DIAGNOSIS — I10 Essential (primary) hypertension: Secondary | ICD-10-CM | POA: Diagnosis not present

## 2020-04-27 DIAGNOSIS — S299XXA Unspecified injury of thorax, initial encounter: Secondary | ICD-10-CM

## 2020-04-27 DIAGNOSIS — S2241XA Multiple fractures of ribs, right side, initial encounter for closed fracture: Secondary | ICD-10-CM

## 2020-04-27 DIAGNOSIS — Z7984 Long term (current) use of oral hypoglycemic drugs: Secondary | ICD-10-CM | POA: Insufficient documentation

## 2020-04-27 DIAGNOSIS — S8002XA Contusion of left knee, initial encounter: Secondary | ICD-10-CM | POA: Insufficient documentation

## 2020-04-27 DIAGNOSIS — S0990XA Unspecified injury of head, initial encounter: Secondary | ICD-10-CM | POA: Insufficient documentation

## 2020-04-27 DIAGNOSIS — Y92410 Unspecified street and highway as the place of occurrence of the external cause: Secondary | ICD-10-CM | POA: Diagnosis not present

## 2020-04-27 DIAGNOSIS — E039 Hypothyroidism, unspecified: Secondary | ICD-10-CM | POA: Diagnosis not present

## 2020-04-27 DIAGNOSIS — Z96652 Presence of left artificial knee joint: Secondary | ICD-10-CM | POA: Diagnosis not present

## 2020-04-27 DIAGNOSIS — S3991XA Unspecified injury of abdomen, initial encounter: Secondary | ICD-10-CM | POA: Diagnosis not present

## 2020-04-27 DIAGNOSIS — Z20822 Contact with and (suspected) exposure to covid-19: Secondary | ICD-10-CM | POA: Insufficient documentation

## 2020-04-27 DIAGNOSIS — S8000XA Contusion of unspecified knee, initial encounter: Secondary | ICD-10-CM

## 2020-04-27 DIAGNOSIS — J918 Pleural effusion in other conditions classified elsewhere: Secondary | ICD-10-CM | POA: Insufficient documentation

## 2020-04-27 DIAGNOSIS — Z79899 Other long term (current) drug therapy: Secondary | ICD-10-CM | POA: Insufficient documentation

## 2020-04-27 DIAGNOSIS — S8001XA Contusion of right knee, initial encounter: Secondary | ICD-10-CM | POA: Diagnosis not present

## 2020-04-27 DIAGNOSIS — E119 Type 2 diabetes mellitus without complications: Secondary | ICD-10-CM | POA: Insufficient documentation

## 2020-04-27 DIAGNOSIS — Z7901 Long term (current) use of anticoagulants: Secondary | ICD-10-CM | POA: Diagnosis not present

## 2020-04-27 DIAGNOSIS — R0602 Shortness of breath: Secondary | ICD-10-CM | POA: Insufficient documentation

## 2020-04-27 DIAGNOSIS — J90 Pleural effusion, not elsewhere classified: Secondary | ICD-10-CM

## 2020-04-27 LAB — CBC WITH DIFFERENTIAL/PLATELET
Abs Immature Granulocytes: 0.32 10*3/uL — ABNORMAL HIGH (ref 0.00–0.07)
Basophils Absolute: 0.1 10*3/uL (ref 0.0–0.1)
Basophils Relative: 0 %
Eosinophils Absolute: 0.4 10*3/uL (ref 0.0–0.5)
Eosinophils Relative: 2 %
HCT: 26 % — ABNORMAL LOW (ref 36.0–46.0)
Hemoglobin: 7.6 g/dL — ABNORMAL LOW (ref 12.0–15.0)
Immature Granulocytes: 2 %
Lymphocytes Relative: 13 %
Lymphs Abs: 2.2 10*3/uL (ref 0.7–4.0)
MCH: 24.8 pg — ABNORMAL LOW (ref 26.0–34.0)
MCHC: 29.2 g/dL — ABNORMAL LOW (ref 30.0–36.0)
MCV: 85 fL (ref 80.0–100.0)
Monocytes Absolute: 0.8 10*3/uL (ref 0.1–1.0)
Monocytes Relative: 5 %
Neutro Abs: 13.6 10*3/uL — ABNORMAL HIGH (ref 1.7–7.7)
Neutrophils Relative %: 78 %
Platelets: 468 10*3/uL — ABNORMAL HIGH (ref 150–400)
RBC: 3.06 MIL/uL — ABNORMAL LOW (ref 3.87–5.11)
RDW: 19.3 % — ABNORMAL HIGH (ref 11.5–15.5)
WBC: 17.3 10*3/uL — ABNORMAL HIGH (ref 4.0–10.5)
nRBC: 0.3 % — ABNORMAL HIGH (ref 0.0–0.2)

## 2020-04-27 LAB — COMPREHENSIVE METABOLIC PANEL
ALT: 23 U/L (ref 0–44)
AST: 45 U/L — ABNORMAL HIGH (ref 15–41)
Albumin: 3.4 g/dL — ABNORMAL LOW (ref 3.5–5.0)
Alkaline Phosphatase: 74 U/L (ref 38–126)
Anion gap: 9 (ref 5–15)
BUN: 21 mg/dL (ref 8–23)
CO2: 23 mmol/L (ref 22–32)
Calcium: 8.4 mg/dL — ABNORMAL LOW (ref 8.9–10.3)
Chloride: 107 mmol/L (ref 98–111)
Creatinine, Ser: 0.81 mg/dL (ref 0.44–1.00)
GFR, Estimated: >60 mL/min (ref >60)
Glucose, Bld: 132 mg/dL — ABNORMAL HIGH (ref 70–99)
Potassium: 3.9 mmol/L (ref 3.5–5.1)
Sodium: 139 mmol/L (ref 135–145)
Total Bilirubin: 0.6 mg/dL (ref 0.3–1.2)
Total Protein: 7.2 g/dL (ref 6.5–8.1)

## 2020-04-27 LAB — RESP PANEL BY RT-PCR (FLU A&B, COVID) ARPGX2
Influenza A by PCR: NEGATIVE
Influenza B by PCR: NEGATIVE
SARS Coronavirus 2 by RT PCR: NEGATIVE

## 2020-04-27 LAB — PREPARE RBC (CROSSMATCH)

## 2020-04-27 LAB — PROTIME-INR
INR: 3.1 — ABNORMAL HIGH (ref 0.8–1.2)
Prothrombin Time: 30.9 seconds — ABNORMAL HIGH (ref 11.4–15.2)

## 2020-04-27 LAB — SAMPLE TO BLOOD BANK

## 2020-04-27 LAB — BRAIN NATRIURETIC PEPTIDE: B Natriuretic Peptide: 793.6 pg/mL — ABNORMAL HIGH (ref 0.0–100.0)

## 2020-04-27 LAB — TROPONIN I (HIGH SENSITIVITY): Troponin I (High Sensitivity): 27 ng/L — ABNORMAL HIGH (ref <18)

## 2020-04-27 MED ORDER — LACTATED RINGERS IV BOLUS
1000.0000 mL | Freq: Once | INTRAVENOUS | Status: AC
  Administered 2020-04-27: 1000 mL via INTRAVENOUS

## 2020-04-27 MED ORDER — IOHEXOL 300 MG/ML  SOLN
100.0000 mL | Freq: Once | INTRAMUSCULAR | Status: AC | PRN
  Administered 2020-04-27: 100 mL via INTRAVENOUS

## 2020-04-27 MED ORDER — LIDOCAINE-EPINEPHRINE 2 %-1:100000 IJ SOLN: 20.0000 mL | Freq: Once | INTRAMUSCULAR | Status: DC

## 2020-04-27 MED ORDER — VITAMIN K1 10 MG/ML IJ SOLN
10.0000 mg | INTRAVENOUS | Status: AC
  Administered 2020-04-27: 10 mg via INTRAVENOUS
  Filled 2020-04-27: qty 1

## 2020-04-27 MED ORDER — PROTHROMBIN COMPLEX CONC HUMAN 500 UNITS IV KIT
1580.0000 [IU] | PACK | Status: AC
  Administered 2020-04-27: 1580 [IU] via INTRAVENOUS
  Filled 2020-04-27: qty 500

## 2020-04-27 MED ORDER — MORPHINE SULFATE (PF) 4 MG/ML IV SOLN
4.0000 mg | Freq: Once | INTRAVENOUS | Status: AC
  Administered 2020-04-27: 4 mg via INTRAVENOUS
  Filled 2020-04-27: qty 1

## 2020-04-27 MED ORDER — SODIUM CHLORIDE 0.9% IV SOLUTION
Freq: Once | INTRAVENOUS | Status: AC
  Filled 2020-04-27: qty 250
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
## 2020-04-27 NOTE — ED Notes (Signed)
Called CT. CT on way to get pt.

## 2020-04-27 NOTE — ED Notes (Signed)
Pt back in room. Moaning in pain still, requesting more pain meds for 10/10 bilateral knee pain.

## 2020-04-27 NOTE — ED Notes (Signed)
Called [facility], spoke with Sam, life Designer, television/film set. Gave report to him and also spoke before with Everett Graff, RN at [facility] ED. Explained that EDP wants pt to be air flighted out.

## 2020-04-27 NOTE — ED Notes (Signed)
[facility] called back. Connected to Sam, CN at [facility], gave him report and recent vitals.

## 2020-04-27 NOTE — ED Notes (Signed)
SPO2 remains 100% on 2L oxygen, turned down from 5L.

## 2020-04-27 NOTE — ED Notes (Signed)
Grandson at bedside

## 2020-04-27 NOTE — ED Notes (Signed)
Called Tammi, pr granddaughter. Gave update on pt status and plan of care.

## 2020-04-27 NOTE — ED Notes (Signed)
EDP at bedside  

## 2020-04-27 NOTE — ED Notes (Signed)
EDP at bedside.  Called pharmacy to ask them to send [facility] and vit K asap. They will verify meds, compound them, then send them as soon as possible.

## 2020-04-27 NOTE — ED Notes (Signed)
Called lab, they have previously collected type and screen which now has order in place. They still have specimen which was collected correctly and they will run type and screen on this specimen.

## 2020-04-27 NOTE — ED Notes (Signed)
Pt to CT

## 2020-04-27 NOTE — ED Notes (Signed)
Blood now at bedside.

## 2020-04-27 NOTE — ED Notes (Signed)
Dr Larinda Buttery at bedside. Bear hugger on pt. LR discontinued.

## 2020-04-27 NOTE — ED Notes (Signed)
EDP at bedside. Pt to receive emergency blood.

## 2020-04-27 NOTE — ED Notes (Signed)
Informed EDP of last BP, taken 3 times.

## 2020-04-27 NOTE — ED Notes (Signed)
Pt not signing consent for transfer because pt being transferred emergently via air flight. Pt gave verbal consent for transfer to Dr Larinda Buttery.

## 2020-04-27 NOTE — ED Notes (Signed)
EDP at bedside explaining plan of care.  Neuro assessment remains unchanged.
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
## 2020-04-27 NOTE — ED Notes (Signed)
EMTALA reviewed by this RN.  

## 2020-04-27 NOTE — ED Notes (Signed)
Second nurse at bedside starting 18g IV. Third nurse running to blood bank for emergency blood.  Sam from [facility] called, they are lifting off now.

## 2020-04-28 LAB — BPAM RBC
Blood Product Expiration Date: 202204272359
Blood Product Expiration Date: 202204272359
Blood Product Expiration Date: 202204282359
Blood Product Expiration Date: 202204282359
ISSUE DATE / TIME: 202203301630
ISSUE DATE / TIME: 202203301630
Unit Type and Rh: 6200
Unit Type and Rh: 6200
Unit Type and Rh: 6200
Unit Type and Rh: 6200

## 2020-04-28 LAB — TYPE AND SCREEN
ABO/RH(D): A POS
Antibody Screen: NEGATIVE
Unit division: 0
Unit division: 0
Unit division: 0
Unit division: 0

## 2020-04-28 LAB — PREPARE RBC (CROSSMATCH)

## 2020-05-03 NOTE — Progress Notes (Deleted)
Cath Change/ Replacement  Patient is present today for a catheter change due to urinary retention.  9 ml of water was removed from the balloon, a 16 FR foley cath was removed with out difficulty.  Patient was cleaned and prepped in a sterile fashion with betadine. A 16 FR foley cath was replaced into the bladder no complications were noted Urine return was noted 10 ml and urine was yellow in color. The balloon was filled with 10ml of sterile water. A leg bag was attached for drainage.  A night bag was also given to the patient and patient was given instruction on how to change from one bag to another. Patient was given proper instruction on catheter care.    Performed by: Cincere Deprey, PA-C   Follow up: One month for Foley exchange   

## 2020-05-04 ENCOUNTER — Ambulatory Visit: Payer: Self-pay | Admitting: Urology

## 2020-05-29 DEATH — deceased
